# Patient Record
Sex: Female | Born: 1955
Health system: Southern US, Community
[De-identification: ages and names within clinical notes are randomized; demographics above are authoritative.]

## PROBLEM LIST (undated history)

## (undated) DIAGNOSIS — M1990 Unspecified osteoarthritis, unspecified site: Secondary | ICD-10-CM

## (undated) DIAGNOSIS — G629 Polyneuropathy, unspecified: Secondary | ICD-10-CM

## (undated) DIAGNOSIS — M8580 Other specified disorders of bone density and structure, unspecified site: Secondary | ICD-10-CM

## (undated) DIAGNOSIS — K5900 Constipation, unspecified: Secondary | ICD-10-CM

## (undated) DIAGNOSIS — E785 Hyperlipidemia, unspecified: Secondary | ICD-10-CM

## (undated) DIAGNOSIS — J45909 Unspecified asthma, uncomplicated: Secondary | ICD-10-CM

## (undated) DIAGNOSIS — J449 Chronic obstructive pulmonary disease, unspecified: Secondary | ICD-10-CM

## (undated) DIAGNOSIS — F32A Depression, unspecified: Secondary | ICD-10-CM

## (undated) DIAGNOSIS — F329 Major depressive disorder, single episode, unspecified: Secondary | ICD-10-CM

## (undated) DIAGNOSIS — E0590 Thyrotoxicosis, unspecified without thyrotoxic crisis or storm: Secondary | ICD-10-CM

## (undated) DIAGNOSIS — N183 Chronic kidney disease, stage 3 unspecified: Secondary | ICD-10-CM

## (undated) DIAGNOSIS — K589 Irritable bowel syndrome without diarrhea: Secondary | ICD-10-CM

## (undated) DIAGNOSIS — G43909 Migraine, unspecified, not intractable, without status migrainosus: Secondary | ICD-10-CM

## (undated) DIAGNOSIS — F419 Anxiety disorder, unspecified: Secondary | ICD-10-CM

## (undated) DIAGNOSIS — K219 Gastro-esophageal reflux disease without esophagitis: Secondary | ICD-10-CM

## (undated) DIAGNOSIS — M797 Fibromyalgia: Secondary | ICD-10-CM

## (undated) DIAGNOSIS — D649 Anemia, unspecified: Secondary | ICD-10-CM

## (undated) DIAGNOSIS — G4733 Obstructive sleep apnea (adult) (pediatric): Secondary | ICD-10-CM

## (undated) DIAGNOSIS — I10 Essential (primary) hypertension: Secondary | ICD-10-CM

## (undated) HISTORY — DX: Other specified disorders of bone density and structure, unspecified site: M85.80

## (undated) HISTORY — DX: Thyrotoxicosis, unspecified without thyrotoxic crisis or storm: E05.90

## (undated) HISTORY — DX: Chronic kidney disease, stage 3 unspecified: N18.30

## (undated) HISTORY — DX: Migraine, unspecified, not intractable, without status migrainosus: G43.909

## (undated) HISTORY — PX: TRACHEOSTOMY: SUR1362

## (undated) HISTORY — PX: CHOLECYSTECTOMY: SHX55

## (undated) HISTORY — DX: Polyneuropathy, unspecified: G62.9

## (undated) HISTORY — DX: Anemia, unspecified: D64.9

## (undated) HISTORY — PX: TONSILLECTOMY: SUR1361

## (undated) HISTORY — DX: Unspecified osteoarthritis, unspecified site: M19.90

## (undated) HISTORY — DX: Constipation, unspecified: K59.00

## (undated) HISTORY — DX: Obstructive sleep apnea (adult) (pediatric): G47.33

## (undated) HISTORY — PX: EYE SURGERY: SHX253

## (undated) HISTORY — DX: Irritable bowel syndrome, unspecified: K58.9

## (undated) HISTORY — DX: Fibromyalgia: M79.7

## (undated) HISTORY — DX: Hyperlipidemia, unspecified: E78.5

---

## 2000-11-02 ENCOUNTER — Other Ambulatory Visit: Admission: RE | Admit: 2000-11-02 | Discharge: 2000-11-02 | Payer: Self-pay | Admitting: *Deleted

## 2014-07-27 DIAGNOSIS — A419 Sepsis, unspecified organism: Secondary | ICD-10-CM

## 2014-07-27 HISTORY — DX: Sepsis, unspecified organism: A41.9

## 2014-08-01 DIAGNOSIS — M545 Low back pain: Secondary | ICD-10-CM | POA: Diagnosis not present

## 2014-08-01 DIAGNOSIS — M1990 Unspecified osteoarthritis, unspecified site: Secondary | ICD-10-CM | POA: Diagnosis not present

## 2014-08-01 DIAGNOSIS — G894 Chronic pain syndrome: Secondary | ICD-10-CM | POA: Diagnosis not present

## 2014-08-01 DIAGNOSIS — M25552 Pain in left hip: Secondary | ICD-10-CM | POA: Diagnosis not present

## 2014-08-01 DIAGNOSIS — E669 Obesity, unspecified: Secondary | ICD-10-CM | POA: Diagnosis not present

## 2014-08-03 DIAGNOSIS — G894 Chronic pain syndrome: Secondary | ICD-10-CM | POA: Diagnosis not present

## 2014-08-03 DIAGNOSIS — M25561 Pain in right knee: Secondary | ICD-10-CM | POA: Diagnosis not present

## 2014-08-03 DIAGNOSIS — M797 Fibromyalgia: Secondary | ICD-10-CM | POA: Diagnosis not present

## 2014-08-03 DIAGNOSIS — R42 Dizziness and giddiness: Secondary | ICD-10-CM | POA: Diagnosis not present

## 2014-08-22 DIAGNOSIS — M170 Bilateral primary osteoarthritis of knee: Secondary | ICD-10-CM | POA: Diagnosis not present

## 2014-08-22 DIAGNOSIS — M797 Fibromyalgia: Secondary | ICD-10-CM | POA: Diagnosis not present

## 2014-08-28 DIAGNOSIS — M797 Fibromyalgia: Secondary | ICD-10-CM | POA: Diagnosis not present

## 2014-08-28 DIAGNOSIS — R42 Dizziness and giddiness: Secondary | ICD-10-CM | POA: Diagnosis not present

## 2014-08-28 DIAGNOSIS — G894 Chronic pain syndrome: Secondary | ICD-10-CM | POA: Diagnosis not present

## 2014-08-28 DIAGNOSIS — Z9181 History of falling: Secondary | ICD-10-CM | POA: Diagnosis not present

## 2014-08-28 DIAGNOSIS — F329 Major depressive disorder, single episode, unspecified: Secondary | ICD-10-CM | POA: Diagnosis not present

## 2014-08-28 DIAGNOSIS — J449 Chronic obstructive pulmonary disease, unspecified: Secondary | ICD-10-CM | POA: Diagnosis not present

## 2014-08-28 DIAGNOSIS — M25561 Pain in right knee: Secondary | ICD-10-CM | POA: Diagnosis not present

## 2014-08-28 DIAGNOSIS — M1990 Unspecified osteoarthritis, unspecified site: Secondary | ICD-10-CM | POA: Diagnosis not present

## 2014-08-29 DIAGNOSIS — S83241A Other tear of medial meniscus, current injury, right knee, initial encounter: Secondary | ICD-10-CM | POA: Diagnosis not present

## 2014-08-29 DIAGNOSIS — M23303 Other meniscus derangements, unspecified medial meniscus, right knee: Secondary | ICD-10-CM | POA: Diagnosis not present

## 2014-08-30 DIAGNOSIS — J449 Chronic obstructive pulmonary disease, unspecified: Secondary | ICD-10-CM | POA: Diagnosis not present

## 2014-08-30 DIAGNOSIS — M25561 Pain in right knee: Secondary | ICD-10-CM | POA: Diagnosis not present

## 2014-08-30 DIAGNOSIS — Z9181 History of falling: Secondary | ICD-10-CM | POA: Diagnosis not present

## 2014-08-30 DIAGNOSIS — G894 Chronic pain syndrome: Secondary | ICD-10-CM | POA: Diagnosis not present

## 2014-08-30 DIAGNOSIS — F329 Major depressive disorder, single episode, unspecified: Secondary | ICD-10-CM | POA: Diagnosis not present

## 2014-08-30 DIAGNOSIS — M1990 Unspecified osteoarthritis, unspecified site: Secondary | ICD-10-CM | POA: Diagnosis not present

## 2014-08-30 DIAGNOSIS — R42 Dizziness and giddiness: Secondary | ICD-10-CM | POA: Diagnosis not present

## 2014-08-30 DIAGNOSIS — M797 Fibromyalgia: Secondary | ICD-10-CM | POA: Diagnosis not present

## 2014-09-03 DIAGNOSIS — E669 Obesity, unspecified: Secondary | ICD-10-CM | POA: Diagnosis not present

## 2014-09-03 DIAGNOSIS — M25561 Pain in right knee: Secondary | ICD-10-CM | POA: Diagnosis not present

## 2014-09-03 DIAGNOSIS — M1990 Unspecified osteoarthritis, unspecified site: Secondary | ICD-10-CM | POA: Diagnosis not present

## 2014-09-03 DIAGNOSIS — G894 Chronic pain syndrome: Secondary | ICD-10-CM | POA: Diagnosis not present

## 2014-09-03 DIAGNOSIS — R42 Dizziness and giddiness: Secondary | ICD-10-CM | POA: Diagnosis not present

## 2014-09-03 DIAGNOSIS — M797 Fibromyalgia: Secondary | ICD-10-CM | POA: Diagnosis not present

## 2014-09-03 DIAGNOSIS — M545 Low back pain: Secondary | ICD-10-CM | POA: Diagnosis not present

## 2014-09-03 DIAGNOSIS — F329 Major depressive disorder, single episode, unspecified: Secondary | ICD-10-CM | POA: Diagnosis not present

## 2014-09-03 DIAGNOSIS — Z9181 History of falling: Secondary | ICD-10-CM | POA: Diagnosis not present

## 2014-09-03 DIAGNOSIS — M25552 Pain in left hip: Secondary | ICD-10-CM | POA: Diagnosis not present

## 2014-09-03 DIAGNOSIS — J449 Chronic obstructive pulmonary disease, unspecified: Secondary | ICD-10-CM | POA: Diagnosis not present

## 2014-09-04 DIAGNOSIS — M1990 Unspecified osteoarthritis, unspecified site: Secondary | ICD-10-CM | POA: Diagnosis not present

## 2014-09-04 DIAGNOSIS — Z9181 History of falling: Secondary | ICD-10-CM | POA: Diagnosis not present

## 2014-09-04 DIAGNOSIS — R42 Dizziness and giddiness: Secondary | ICD-10-CM | POA: Diagnosis not present

## 2014-09-04 DIAGNOSIS — G894 Chronic pain syndrome: Secondary | ICD-10-CM | POA: Diagnosis not present

## 2014-09-04 DIAGNOSIS — J449 Chronic obstructive pulmonary disease, unspecified: Secondary | ICD-10-CM | POA: Diagnosis not present

## 2014-09-04 DIAGNOSIS — M797 Fibromyalgia: Secondary | ICD-10-CM | POA: Diagnosis not present

## 2014-09-04 DIAGNOSIS — M25561 Pain in right knee: Secondary | ICD-10-CM | POA: Diagnosis not present

## 2014-09-04 DIAGNOSIS — F329 Major depressive disorder, single episode, unspecified: Secondary | ICD-10-CM | POA: Diagnosis not present

## 2014-09-05 DIAGNOSIS — F329 Major depressive disorder, single episode, unspecified: Secondary | ICD-10-CM | POA: Diagnosis not present

## 2014-09-05 DIAGNOSIS — Z9181 History of falling: Secondary | ICD-10-CM | POA: Diagnosis not present

## 2014-09-05 DIAGNOSIS — J449 Chronic obstructive pulmonary disease, unspecified: Secondary | ICD-10-CM | POA: Diagnosis not present

## 2014-09-05 DIAGNOSIS — M1990 Unspecified osteoarthritis, unspecified site: Secondary | ICD-10-CM | POA: Diagnosis not present

## 2014-09-05 DIAGNOSIS — M25561 Pain in right knee: Secondary | ICD-10-CM | POA: Diagnosis not present

## 2014-09-05 DIAGNOSIS — M797 Fibromyalgia: Secondary | ICD-10-CM | POA: Diagnosis not present

## 2014-09-05 DIAGNOSIS — R42 Dizziness and giddiness: Secondary | ICD-10-CM | POA: Diagnosis not present

## 2014-09-05 DIAGNOSIS — G894 Chronic pain syndrome: Secondary | ICD-10-CM | POA: Diagnosis not present

## 2014-09-08 DIAGNOSIS — M1990 Unspecified osteoarthritis, unspecified site: Secondary | ICD-10-CM | POA: Diagnosis not present

## 2014-09-08 DIAGNOSIS — G894 Chronic pain syndrome: Secondary | ICD-10-CM | POA: Diagnosis not present

## 2014-09-08 DIAGNOSIS — M25561 Pain in right knee: Secondary | ICD-10-CM | POA: Diagnosis not present

## 2014-09-08 DIAGNOSIS — Z9181 History of falling: Secondary | ICD-10-CM | POA: Diagnosis not present

## 2014-09-08 DIAGNOSIS — F329 Major depressive disorder, single episode, unspecified: Secondary | ICD-10-CM | POA: Diagnosis not present

## 2014-09-08 DIAGNOSIS — J449 Chronic obstructive pulmonary disease, unspecified: Secondary | ICD-10-CM | POA: Diagnosis not present

## 2014-09-08 DIAGNOSIS — M797 Fibromyalgia: Secondary | ICD-10-CM | POA: Diagnosis not present

## 2014-09-08 DIAGNOSIS — R42 Dizziness and giddiness: Secondary | ICD-10-CM | POA: Diagnosis not present

## 2014-09-16 DIAGNOSIS — R0902 Hypoxemia: Secondary | ICD-10-CM | POA: Diagnosis not present

## 2014-09-16 DIAGNOSIS — J441 Chronic obstructive pulmonary disease with (acute) exacerbation: Secondary | ICD-10-CM | POA: Diagnosis not present

## 2014-09-16 DIAGNOSIS — R069 Unspecified abnormalities of breathing: Secondary | ICD-10-CM | POA: Diagnosis not present

## 2014-09-16 DIAGNOSIS — A419 Sepsis, unspecified organism: Secondary | ICD-10-CM | POA: Diagnosis not present

## 2014-09-16 DIAGNOSIS — R6521 Severe sepsis with septic shock: Secondary | ICD-10-CM | POA: Diagnosis not present

## 2014-09-17 ENCOUNTER — Inpatient Hospital Stay (HOSPITAL_COMMUNITY): Payer: Commercial Managed Care - HMO

## 2014-09-17 ENCOUNTER — Inpatient Hospital Stay (HOSPITAL_COMMUNITY)
Admission: EM | Admit: 2014-09-17 | Discharge: 2014-10-11 | DRG: 004 | Disposition: A | Discharge status: Skilled Nursing Facility | Payer: Commercial Managed Care - HMO | Source: Other Acute Inpatient Hospital | Attending: Emergency Medicine | Admitting: Emergency Medicine

## 2014-09-17 DIAGNOSIS — N390 Urinary tract infection, site not specified: Secondary | ICD-10-CM | POA: Diagnosis not present

## 2014-09-17 DIAGNOSIS — I639 Cerebral infarction, unspecified: Secondary | ICD-10-CM

## 2014-09-17 DIAGNOSIS — Z7951 Long term (current) use of inhaled steroids: Secondary | ICD-10-CM

## 2014-09-17 DIAGNOSIS — A409 Streptococcal sepsis, unspecified: Secondary | ICD-10-CM | POA: Diagnosis present

## 2014-09-17 DIAGNOSIS — E2740 Unspecified adrenocortical insufficiency: Secondary | ICD-10-CM | POA: Diagnosis present

## 2014-09-17 DIAGNOSIS — J9600 Acute respiratory failure, unspecified whether with hypoxia or hypercapnia: Secondary | ICD-10-CM | POA: Diagnosis not present

## 2014-09-17 DIAGNOSIS — E8770 Fluid overload, unspecified: Secondary | ICD-10-CM | POA: Diagnosis not present

## 2014-09-17 DIAGNOSIS — J189 Pneumonia, unspecified organism: Secondary | ICD-10-CM | POA: Diagnosis not present

## 2014-09-17 DIAGNOSIS — F329 Major depressive disorder, single episode, unspecified: Secondary | ICD-10-CM | POA: Diagnosis present

## 2014-09-17 DIAGNOSIS — L03113 Cellulitis of right upper limb: Secondary | ICD-10-CM | POA: Diagnosis not present

## 2014-09-17 DIAGNOSIS — R4020 Unspecified coma: Secondary | ICD-10-CM | POA: Diagnosis not present

## 2014-09-17 DIAGNOSIS — L03115 Cellulitis of right lower limb: Secondary | ICD-10-CM | POA: Diagnosis present

## 2014-09-17 DIAGNOSIS — R0682 Tachypnea, not elsewhere classified: Secondary | ICD-10-CM

## 2014-09-17 DIAGNOSIS — M6282 Rhabdomyolysis: Secondary | ICD-10-CM | POA: Diagnosis not present

## 2014-09-17 DIAGNOSIS — R918 Other nonspecific abnormal finding of lung field: Secondary | ICD-10-CM | POA: Diagnosis not present

## 2014-09-17 DIAGNOSIS — E662 Morbid (severe) obesity with alveolar hypoventilation: Secondary | ICD-10-CM | POA: Diagnosis not present

## 2014-09-17 DIAGNOSIS — L0390 Cellulitis, unspecified: Secondary | ICD-10-CM | POA: Insufficient documentation

## 2014-09-17 DIAGNOSIS — J90 Pleural effusion, not elsewhere classified: Secondary | ICD-10-CM | POA: Diagnosis not present

## 2014-09-17 DIAGNOSIS — I9589 Other hypotension: Secondary | ICD-10-CM | POA: Diagnosis not present

## 2014-09-17 DIAGNOSIS — G8250 Quadriplegia, unspecified: Secondary | ICD-10-CM | POA: Diagnosis not present

## 2014-09-17 DIAGNOSIS — R579 Shock, unspecified: Secondary | ICD-10-CM | POA: Diagnosis present

## 2014-09-17 DIAGNOSIS — R945 Abnormal results of liver function studies: Secondary | ICD-10-CM

## 2014-09-17 DIAGNOSIS — R6521 Severe sepsis with septic shock: Secondary | ICD-10-CM | POA: Diagnosis not present

## 2014-09-17 DIAGNOSIS — R0689 Other abnormalities of breathing: Secondary | ICD-10-CM | POA: Diagnosis not present

## 2014-09-17 DIAGNOSIS — J45909 Unspecified asthma, uncomplicated: Secondary | ICD-10-CM | POA: Diagnosis present

## 2014-09-17 DIAGNOSIS — F419 Anxiety disorder, unspecified: Secondary | ICD-10-CM | POA: Diagnosis present

## 2014-09-17 DIAGNOSIS — E870 Hyperosmolality and hypernatremia: Secondary | ICD-10-CM | POA: Diagnosis present

## 2014-09-17 DIAGNOSIS — J9621 Acute and chronic respiratory failure with hypoxia: Secondary | ICD-10-CM | POA: Diagnosis not present

## 2014-09-17 DIAGNOSIS — N170 Acute kidney failure with tubular necrosis: Secondary | ICD-10-CM | POA: Diagnosis not present

## 2014-09-17 DIAGNOSIS — N179 Acute kidney failure, unspecified: Secondary | ICD-10-CM | POA: Diagnosis present

## 2014-09-17 DIAGNOSIS — F339 Major depressive disorder, recurrent, unspecified: Secondary | ICD-10-CM | POA: Diagnosis not present

## 2014-09-17 DIAGNOSIS — G6281 Critical illness polyneuropathy: Secondary | ICD-10-CM | POA: Diagnosis not present

## 2014-09-17 DIAGNOSIS — Z930 Tracheostomy status: Secondary | ICD-10-CM | POA: Diagnosis not present

## 2014-09-17 DIAGNOSIS — M5032 Other cervical disc degeneration, mid-cervical region: Secondary | ICD-10-CM | POA: Diagnosis not present

## 2014-09-17 DIAGNOSIS — E871 Hypo-osmolality and hyponatremia: Secondary | ICD-10-CM | POA: Diagnosis not present

## 2014-09-17 DIAGNOSIS — R1312 Dysphagia, oropharyngeal phase: Secondary | ICD-10-CM | POA: Diagnosis present

## 2014-09-17 DIAGNOSIS — R4182 Altered mental status, unspecified: Secondary | ICD-10-CM | POA: Insufficient documentation

## 2014-09-17 DIAGNOSIS — J449 Chronic obstructive pulmonary disease, unspecified: Secondary | ICD-10-CM | POA: Diagnosis not present

## 2014-09-17 DIAGNOSIS — R0602 Shortness of breath: Secondary | ICD-10-CM | POA: Diagnosis not present

## 2014-09-17 DIAGNOSIS — K219 Gastro-esophageal reflux disease without esophagitis: Secondary | ICD-10-CM | POA: Diagnosis present

## 2014-09-17 DIAGNOSIS — A419 Sepsis, unspecified organism: Secondary | ICD-10-CM

## 2014-09-17 DIAGNOSIS — G7281 Critical illness myopathy: Secondary | ICD-10-CM | POA: Diagnosis present

## 2014-09-17 DIAGNOSIS — J9690 Respiratory failure, unspecified, unspecified whether with hypoxia or hypercapnia: Secondary | ICD-10-CM

## 2014-09-17 DIAGNOSIS — Z789 Other specified health status: Secondary | ICD-10-CM | POA: Diagnosis not present

## 2014-09-17 DIAGNOSIS — J80 Acute respiratory distress syndrome: Secondary | ICD-10-CM | POA: Insufficient documentation

## 2014-09-17 DIAGNOSIS — R401 Stupor: Secondary | ICD-10-CM | POA: Diagnosis not present

## 2014-09-17 DIAGNOSIS — R1310 Dysphagia, unspecified: Secondary | ICD-10-CM | POA: Diagnosis not present

## 2014-09-17 DIAGNOSIS — G9341 Metabolic encephalopathy: Secondary | ICD-10-CM | POA: Diagnosis present

## 2014-09-17 DIAGNOSIS — Z452 Encounter for adjustment and management of vascular access device: Secondary | ICD-10-CM

## 2014-09-17 DIAGNOSIS — R532 Functional quadriplegia: Secondary | ICD-10-CM | POA: Diagnosis not present

## 2014-09-17 DIAGNOSIS — E43 Unspecified severe protein-calorie malnutrition: Secondary | ICD-10-CM | POA: Diagnosis not present

## 2014-09-17 DIAGNOSIS — Z8249 Family history of ischemic heart disease and other diseases of the circulatory system: Secondary | ICD-10-CM | POA: Diagnosis not present

## 2014-09-17 DIAGNOSIS — I129 Hypertensive chronic kidney disease with stage 1 through stage 4 chronic kidney disease, or unspecified chronic kidney disease: Secondary | ICD-10-CM | POA: Diagnosis present

## 2014-09-17 DIAGNOSIS — J9811 Atelectasis: Secondary | ICD-10-CM | POA: Diagnosis not present

## 2014-09-17 DIAGNOSIS — Z794 Long term (current) use of insulin: Secondary | ICD-10-CM

## 2014-09-17 DIAGNOSIS — E46 Unspecified protein-calorie malnutrition: Secondary | ICD-10-CM | POA: Diagnosis not present

## 2014-09-17 DIAGNOSIS — D259 Leiomyoma of uterus, unspecified: Secondary | ICD-10-CM | POA: Diagnosis not present

## 2014-09-17 DIAGNOSIS — R001 Bradycardia, unspecified: Secondary | ICD-10-CM | POA: Diagnosis not present

## 2014-09-17 DIAGNOSIS — S80811A Abrasion, right lower leg, initial encounter: Secondary | ICD-10-CM | POA: Diagnosis not present

## 2014-09-17 DIAGNOSIS — Z430 Encounter for attention to tracheostomy: Secondary | ICD-10-CM | POA: Diagnosis not present

## 2014-09-17 DIAGNOSIS — M869 Osteomyelitis, unspecified: Secondary | ICD-10-CM | POA: Diagnosis not present

## 2014-09-17 DIAGNOSIS — R7989 Other specified abnormal findings of blood chemistry: Secondary | ICD-10-CM | POA: Diagnosis present

## 2014-09-17 DIAGNOSIS — J441 Chronic obstructive pulmonary disease with (acute) exacerbation: Secondary | ICD-10-CM | POA: Diagnosis not present

## 2014-09-17 DIAGNOSIS — E872 Acidosis: Secondary | ICD-10-CM | POA: Diagnosis present

## 2014-09-17 DIAGNOSIS — N182 Chronic kidney disease, stage 2 (mild): Secondary | ICD-10-CM | POA: Diagnosis present

## 2014-09-17 DIAGNOSIS — J811 Chronic pulmonary edema: Secondary | ICD-10-CM | POA: Diagnosis not present

## 2014-09-17 DIAGNOSIS — J154 Pneumonia due to other streptococci: Secondary | ICD-10-CM | POA: Diagnosis present

## 2014-09-17 DIAGNOSIS — L03119 Cellulitis of unspecified part of limb: Secondary | ICD-10-CM | POA: Diagnosis not present

## 2014-09-17 DIAGNOSIS — Z978 Presence of other specified devices: Secondary | ICD-10-CM

## 2014-09-17 DIAGNOSIS — M6281 Muscle weakness (generalized): Secondary | ICD-10-CM | POA: Diagnosis not present

## 2014-09-17 DIAGNOSIS — R748 Abnormal levels of other serum enzymes: Secondary | ICD-10-CM | POA: Diagnosis not present

## 2014-09-17 DIAGNOSIS — G9340 Encephalopathy, unspecified: Secondary | ICD-10-CM | POA: Diagnosis not present

## 2014-09-17 DIAGNOSIS — Z79899 Other long term (current) drug therapy: Secondary | ICD-10-CM | POA: Diagnosis not present

## 2014-09-17 DIAGNOSIS — I10 Essential (primary) hypertension: Secondary | ICD-10-CM | POA: Diagnosis not present

## 2014-09-17 DIAGNOSIS — R112 Nausea with vomiting, unspecified: Secondary | ICD-10-CM | POA: Diagnosis not present

## 2014-09-17 DIAGNOSIS — I469 Cardiac arrest, cause unspecified: Secondary | ICD-10-CM | POA: Diagnosis not present

## 2014-09-17 DIAGNOSIS — R400 Somnolence: Secondary | ICD-10-CM | POA: Diagnosis not present

## 2014-09-17 DIAGNOSIS — R069 Unspecified abnormalities of breathing: Secondary | ICD-10-CM

## 2014-09-17 DIAGNOSIS — E876 Hypokalemia: Secondary | ICD-10-CM | POA: Diagnosis not present

## 2014-09-17 DIAGNOSIS — J9601 Acute respiratory failure with hypoxia: Secondary | ICD-10-CM | POA: Diagnosis not present

## 2014-09-17 DIAGNOSIS — R0600 Dyspnea, unspecified: Secondary | ICD-10-CM | POA: Diagnosis present

## 2014-09-17 DIAGNOSIS — Z4659 Encounter for fitting and adjustment of other gastrointestinal appliance and device: Secondary | ICD-10-CM

## 2014-09-17 DIAGNOSIS — I517 Cardiomegaly: Secondary | ICD-10-CM | POA: Diagnosis not present

## 2014-09-17 DIAGNOSIS — R0902 Hypoxemia: Secondary | ICD-10-CM | POA: Insufficient documentation

## 2014-09-17 DIAGNOSIS — Z4682 Encounter for fitting and adjustment of non-vascular catheter: Secondary | ICD-10-CM | POA: Diagnosis not present

## 2014-09-17 DIAGNOSIS — M86161 Other acute osteomyelitis, right tibia and fibula: Secondary | ICD-10-CM

## 2014-09-17 DIAGNOSIS — M7989 Other specified soft tissue disorders: Secondary | ICD-10-CM | POA: Diagnosis not present

## 2014-09-17 DIAGNOSIS — J810 Acute pulmonary edema: Secondary | ICD-10-CM | POA: Diagnosis not present

## 2014-09-17 DIAGNOSIS — Z9289 Personal history of other medical treatment: Secondary | ICD-10-CM

## 2014-09-17 DIAGNOSIS — E785 Hyperlipidemia, unspecified: Secondary | ICD-10-CM | POA: Diagnosis not present

## 2014-09-17 HISTORY — DX: Anxiety disorder, unspecified: F41.9

## 2014-09-17 HISTORY — DX: Depression, unspecified: F32.A

## 2014-09-17 HISTORY — DX: Essential (primary) hypertension: I10

## 2014-09-17 HISTORY — DX: Unspecified asthma, uncomplicated: J45.909

## 2014-09-17 HISTORY — DX: Major depressive disorder, single episode, unspecified: F32.9

## 2014-09-17 HISTORY — DX: Chronic obstructive pulmonary disease, unspecified: J44.9

## 2014-09-17 HISTORY — DX: Gastro-esophageal reflux disease without esophagitis: K21.9

## 2014-09-17 LAB — COMPREHENSIVE METABOLIC PANEL
ALBUMIN: 2 g/dL — AB (ref 3.5–5.2)
ALT: 86 U/L — ABNORMAL HIGH (ref 0–35)
AST: 123 U/L — AB (ref 0–37)
Alkaline Phosphatase: 24 U/L — ABNORMAL LOW (ref 39–117)
Anion gap: 12 (ref 5–15)
BUN: 50 mg/dL — AB (ref 6–23)
CALCIUM: 6.8 mg/dL — AB (ref 8.4–10.5)
CHLORIDE: 103 mmol/L (ref 96–112)
CO2: 19 mmol/L (ref 19–32)
Creatinine, Ser: 3.63 mg/dL — ABNORMAL HIGH (ref 0.50–1.10)
GFR calc Af Amer: 15 mL/min — ABNORMAL LOW (ref >90)
GFR calc non Af Amer: 13 mL/min — ABNORMAL LOW (ref >90)
GLUCOSE: 198 mg/dL — AB (ref 70–99)
POTASSIUM: 4.4 mmol/L (ref 3.5–5.1)
SODIUM: 134 mmol/L — AB (ref 135–145)
TOTAL PROTEIN: 4.4 g/dL — AB (ref 6.0–8.3)
Total Bilirubin: 2.5 mg/dL — ABNORMAL HIGH (ref 0.3–1.2)

## 2014-09-17 LAB — BLOOD GAS, ARTERIAL
Acid-base deficit: 8.4 mmol/L — ABNORMAL HIGH (ref 0.0–2.0)
Bicarbonate: 17.6 mEq/L — ABNORMAL LOW (ref 20.0–24.0)
FIO2: 0.4 %
MECHVT: 500 mL
O2 Saturation: 93.5 %
PATIENT TEMPERATURE: 98.6
PEEP: 5 cmH2O
PH ART: 7.243 — AB (ref 7.350–7.450)
RATE: 15 resp/min
TCO2: 18.9 mmol/L (ref 0–100)
pCO2 arterial: 42.2 mmHg (ref 35.0–45.0)
pO2, Arterial: 74.9 mmHg — ABNORMAL LOW (ref 80.0–100.0)

## 2014-09-17 LAB — CBC
HEMATOCRIT: 27.3 % — AB (ref 36.0–46.0)
Hemoglobin: 9.1 g/dL — ABNORMAL LOW (ref 12.0–15.0)
MCH: 26.5 pg (ref 26.0–34.0)
MCHC: 33.3 g/dL (ref 30.0–36.0)
MCV: 79.4 fL (ref 78.0–100.0)
PLATELETS: 109 10*3/uL — AB (ref 150–400)
RBC: 3.44 MIL/uL — ABNORMAL LOW (ref 3.87–5.11)
RDW: 14.7 % (ref 11.5–15.5)
WBC: 5.6 10*3/uL (ref 4.0–10.5)

## 2014-09-17 LAB — PHOSPHORUS: PHOSPHORUS: 4.4 mg/dL (ref 2.3–4.6)

## 2014-09-17 LAB — RAPID URINE DRUG SCREEN, HOSP PERFORMED
Amphetamines: NOT DETECTED
BARBITURATES: NOT DETECTED
BENZODIAZEPINES: NOT DETECTED
COCAINE: NOT DETECTED
OPIATES: POSITIVE — AB
Tetrahydrocannabinol: NOT DETECTED

## 2014-09-17 LAB — PROTIME-INR
INR: 1.63 — ABNORMAL HIGH (ref 0.00–1.49)
PROTHROMBIN TIME: 19.4 s — AB (ref 11.6–15.2)

## 2014-09-17 LAB — MRSA PCR SCREENING: MRSA by PCR: NEGATIVE

## 2014-09-17 LAB — LACTIC ACID, PLASMA: Lactic Acid, Venous: 4.8 mmol/L (ref 0.5–2.0)

## 2014-09-17 LAB — GLUCOSE, CAPILLARY: Glucose-Capillary: 180 mg/dL — ABNORMAL HIGH (ref 70–99)

## 2014-09-17 LAB — MAGNESIUM: Magnesium: 1.9 mg/dL (ref 1.5–2.5)

## 2014-09-17 LAB — PROCALCITONIN: Procalcitonin: 64.94 ng/mL

## 2014-09-17 LAB — CK: CK TOTAL: 1482 U/L — AB (ref 7–177)

## 2014-09-17 MED ORDER — PIPERACILLIN-TAZOBACTAM 3.375 G IVPB
3.3750 g | Freq: Three times a day (TID) | INTRAVENOUS | Status: DC
  Administered 2014-09-18 (×2): 3.375 g via INTRAVENOUS
  Filled 2014-09-17 (×4): qty 50

## 2014-09-17 MED ORDER — FENTANYL CITRATE 0.05 MG/ML IJ SOLN: 50.0000 ug | Freq: Once | INTRAMUSCULAR | Status: DC

## 2014-09-17 MED ORDER — CETYLPYRIDINIUM CHLORIDE 0.05 % MT LIQD
7.0000 mL | Freq: Four times a day (QID) | OROMUCOSAL | Status: DC
  Administered 2014-09-18 – 2014-10-11 (×95): 7 mL via OROMUCOSAL

## 2014-09-17 MED ORDER — SODIUM CHLORIDE 0.9 % IV BOLUS (SEPSIS)
1000.0000 mL | Freq: Once | INTRAVENOUS | Status: AC
  Administered 2014-09-18: 1000 mL via INTRAVENOUS

## 2014-09-17 MED ORDER — FENTANYL BOLUS VIA INFUSION
50.0000 ug | INTRAVENOUS | Status: DC | PRN
  Administered 2014-09-19 – 2014-09-21 (×2): 50 ug via INTRAVENOUS
  Filled 2014-09-17: qty 50

## 2014-09-17 MED ORDER — NOREPINEPHRINE BITARTRATE 1 MG/ML IV SOLN
0.0000 ug/min | INTRAVENOUS | Status: DC
  Administered 2014-09-18: 30 ug/min via INTRAVENOUS
  Filled 2014-09-17: qty 4

## 2014-09-17 MED ORDER — SODIUM CHLORIDE 0.9 % IV SOLN
2000.0000 mg | Freq: Once | INTRAVENOUS | Status: AC
  Administered 2014-09-18: 2000 mg via INTRAVENOUS
  Filled 2014-09-17: qty 2000

## 2014-09-17 MED ORDER — MIDAZOLAM HCL 2 MG/2ML IJ SOLN
2.0000 mg | INTRAMUSCULAR | Status: AC | PRN
  Administered 2014-09-18 (×3): 2 mg via INTRAVENOUS
  Filled 2014-09-17 (×2): qty 2

## 2014-09-17 MED ORDER — SODIUM CHLORIDE 0.9 % IV SOLN
25.0000 ug/h | INTRAVENOUS | Status: DC
  Administered 2014-09-18: 350 ug/h via INTRAVENOUS
  Administered 2014-09-18 (×2): 200 ug/h via INTRAVENOUS
  Administered 2014-09-19 (×3): 400 ug/h via INTRAVENOUS
  Administered 2014-09-20: 250 ug/h via INTRAVENOUS
  Administered 2014-09-20: 350 ug/h via INTRAVENOUS
  Administered 2014-09-20: 400 ug/h via INTRAVENOUS
  Administered 2014-09-21 (×2): 300 ug/h via INTRAVENOUS
  Administered 2014-09-23: 220 ug/h via INTRAVENOUS
  Administered 2014-09-24 (×2): 250 ug/h via INTRAVENOUS
  Administered 2014-09-25: 220 ug/h via INTRAVENOUS
  Administered 2014-09-26: 25 ug/h via INTRAVENOUS
  Administered 2014-09-28: 150 ug/h via INTRAVENOUS
  Filled 2014-09-17 (×20): qty 50

## 2014-09-17 MED ORDER — IPRATROPIUM-ALBUTEROL 0.5-2.5 (3) MG/3ML IN SOLN
3.0000 mL | Freq: Four times a day (QID) | RESPIRATORY_TRACT | Status: DC
  Administered 2014-09-17 – 2014-10-02 (×59): 3 mL via RESPIRATORY_TRACT
  Filled 2014-09-17 (×59): qty 3

## 2014-09-17 MED ORDER — ALBUTEROL SULFATE (2.5 MG/3ML) 0.083% IN NEBU
2.5000 mg | INHALATION_SOLUTION | RESPIRATORY_TRACT | Status: DC | PRN
  Administered 2014-09-19 – 2014-10-03 (×3): 2.5 mg via RESPIRATORY_TRACT
  Filled 2014-09-17 (×3): qty 3

## 2014-09-17 MED ORDER — MIDAZOLAM HCL 2 MG/2ML IJ SOLN
2.0000 mg | INTRAMUSCULAR | Status: DC | PRN
  Filled 2014-09-17 (×3): qty 2

## 2014-09-17 MED ORDER — PANTOPRAZOLE SODIUM 40 MG IV SOLR
40.0000 mg | Freq: Every day | INTRAVENOUS | Status: DC
  Administered 2014-09-18 – 2014-09-21 (×5): 40 mg via INTRAVENOUS
  Filled 2014-09-17 (×6): qty 40

## 2014-09-17 MED ORDER — CHLORHEXIDINE GLUCONATE 0.12 % MT SOLN
15.0000 mL | Freq: Two times a day (BID) | OROMUCOSAL | Status: DC
  Administered 2014-09-18 – 2014-10-11 (×47): 15 mL via OROMUCOSAL
  Filled 2014-09-17 (×46): qty 15

## 2014-09-17 MED ORDER — SODIUM CHLORIDE 0.9 % IV SOLN
250.0000 mL | INTRAVENOUS | Status: DC | PRN
Start: 2014-09-17 — End: 2014-10-11
  Administered 2014-09-21 – 2014-09-26 (×5): 250 mL via INTRAVENOUS

## 2014-09-17 MED ORDER — SODIUM CHLORIDE 0.9 % IV SOLN
INTRAVENOUS | Status: DC
  Administered 2014-09-18 – 2014-09-20 (×3): via INTRAVENOUS

## 2014-09-17 MED ORDER — HEPARIN SODIUM (PORCINE) 5000 UNIT/ML IJ SOLN
5000.0000 [IU] | Freq: Three times a day (TID) | INTRAMUSCULAR | Status: DC
  Administered 2014-09-18 – 2014-09-19 (×5): 5000 [IU] via SUBCUTANEOUS
  Filled 2014-09-17 (×8): qty 1

## 2014-09-17 NOTE — Progress Notes (Signed)
ANTIBIOTIC CONSULT NOTE - INITIAL  Pharmacy Consult for Vancomycin / Zosyn Indication: rule out sepsis  Allergies not on file  Patient Measurements: Height: 5\' 5"  (165.1 cm) Weight: 245 lb 2.4 oz (111.2 kg) IBW/kg (Calculated) : 57   Vital Signs: Temp: 100.3 F (37.9 C) (02/22 2015) Temp Source: Oral (02/22 2015) BP: 100/56 mmHg (02/22 2100) Pulse Rate: 127 (02/22 2100) Intake/Output from previous day:   Intake/Output from this shift:    Labs: No results for input(s): WBC, HGB, PLT, LABCREA, CREATININE in the last 72 hours. CrCl cannot be calculated (Patient has no serum creatinine result on file.). No results for input(s): VANCOTROUGH, VANCOPEAK, VANCORANDOM, GENTTROUGH, GENTPEAK, GENTRANDOM, TOBRATROUGH, TOBRAPEAK, TOBRARND, AMIKACINPEAK, AMIKACINTROU, AMIKACIN in the last 72 hours.   Microbiology: No results found for this or any previous visit (from the past 720 hour(s)).  Medical History: No past medical history on file.  Assessment: 59yof treated for possible UTI at [facility] with Zosyn and Levafloxacin.  Cr 2.8 Crcl approx 5ml/min.  Transferred to [facility]  Broaden ABX to vancomycin and zosyn - will initiate doses tonight and adjust based on lab results in am.  Goal of Therapy:  Vancomycin trough level 15-20 mcg/ml  Plan:  Vancomycin 2gm IV x1 redose 2/23 after lab results back Zosyn 3.375gm IV q8 EI - may need decreased dose based on renal function in AM  [employer].D. CPP, BCPS Clinical Pharmacist [phone] 09/17/2014 9:48 PM

## 2014-09-17 NOTE — H&P (Signed)
PULMONARY / CRITICAL CARE MEDICINE   Name: Jamie Fields MRN: [id number] DOB: 04/08/1956    ADMISSION DATE:  09/17/2014 CONSULTATION DATE:  09/17/2014   REFERRING MD :  [facility]  CHIEF COMPLAINT:  Respiratory Distress  INITIAL PRESENTATION: 59 y.o. F who was taken to [facility] ED early AM hours 2/22 for respiratory distress.  She was admitted and placed on BiPAP.  Was also found to have acute renal failure, AG metabolic acidosis, Overnight, her acidosis and respiratory status worsened to the point she required intubation.  She was also placed on levophed for persistent shock.  Later in day was transferred to [facility] for further management.  STUDIES:  CT Chest 2/22 >>> no acute process CT abd / pelv 2/22 >>> iliac lymph nodes likely reactive.  No acute process  SIGNIFICANT EVENTS: 2/22 - admitted at [place] in early AM hours, transferred to [facility] later that evening   HISTORY OF PRESENT ILLNESS:  Pt is encephelopathic; therefore, this HPI is obtained from chart review. Jamie Fields is a 59 y.o. F with PMH of HTN, Asthma, COPD, GERD, Arthritis, Depression, Anxiety, Pain.  She was in her USOH until early AM hours of 2/22 when she was taken to [facility] ED for N/D, decreased PO intake, SOB, subjective fevers, myalgias. She was admitted for hypoxic respiratory failure and septic shock of unclear etiology. Overnight, her respiratory status and acidosis worsened to the point that she required intubation.  She was later transferred to [facility] for further management.  I spoke with pt's son who stated that pt was in her [place] on 2/22, just 2 days prior to presentation.  Pt's brother then told son that the following day, pt seemed a little weak and SOB.  They did not think anything of it and went about their usual activities.  Later that night, her symptoms worsened and she developed nausea, diarrhea, myalgias.  This is what prompted them to seek medical attention.  PAST MEDICAL HISTORY :  HTN, Asthma,  COPD, GERD, Arthritis, Depression, Anxiety, Pain   has no past medical history on file.  has no past surgical history on file. Prior to Admission medications   Not on File   Allergies not on file  FAMILY HISTORY:  has no family status information on file.  SOCIAL HISTORY:    REVIEW OF SYSTEMS:  Unable to obtain as pt is intubated.  SUBJECTIVE:   VITAL SIGNS: Temp:  [100.3 F (37.9 C)] 100.3 F (37.9 C) (02/22 2015) Pulse Rate:  [118-125] 118 (02/22 2030) Resp:  [22] 22 (02/22 2030) BP: (93-130)/(40-99) 93/40 mmHg (02/22 2030) SpO2:  [94 %-95 %] 94 % (02/22 2030) FiO2 (%):  [40 %] 40 % (02/22 2015) Weight:  [111.2 kg (245 lb 2.4 oz)] 111.2 kg (245 lb 2.4 oz) (02/22 2015) HEMODYNAMICS:   VENTILATOR SETTINGS: Vent Mode:  [-] PRVC FiO2 (%):  [40 %] 40 % Set Rate:  [15 bmp] 15 bmp Vt Set:  [500 mL] 500 mL PEEP:  [5 cmH20] [place] Pressure:  [9 cmH20] 9 cmH20 INTAKE / OUTPUT: No intake or output data in the 24 hours ending 09/17/14 2101  PHYSICAL EXAMINATION: General: Obese female, resting in bed, in NAD. Neuro: Sedated on vent. HEENT: [place]/AT. PERRL, sclerae anicteric. Cardiovascular: RRR, no M/R/G.  Lungs: Respirations shallow and unlabored.  CTA bilaterally, No W/R/R. Abdomen: Obese, BS x 4, soft, NT/ND.  Musculoskeletal: No gross deformities, no edema.  Skin: Intact, warm, no rashes.   LABS:  CBC No results  for input(s): WBC, HGB, HCT, PLT in the last 168 hours. Coag's No results for input(s): APTT, INR in the last 168 hours. BMET No results for input(s): NA, K, CL, CO2, BUN, CREATININE, GLUCOSE in the last 168 hours. Electrolytes No results for input(s): CALCIUM, MG, PHOS in the last 168 hours. Sepsis Markers No results for input(s): LATICACIDVEN, PROCALCITON, O2SATVEN in the last 168 hours. ABG No results for input(s): PHART, PCO2ART, PO2ART in the last 168 hours. Liver Enzymes No results for input(s): AST, ALT, ALKPHOS, BILITOT, ALBUMIN in the  last 168 hours. Cardiac Enzymes No results for input(s): TROPONINI, PROBNP in the last 168 hours. Glucose  Recent Labs Lab 09/17/14 2022  GLUCAP 180*    Imaging No results found.   ASSESSMENT / PLAN:  PULMONARY OETT 2/22 >>> A: Acute hypoxic respiratory failure COPD without evidence of exacerbation Doubt PNA P:   Full vent support. Wean as able depending on improvement in metabolic acidosis Daily SBT if able. DuoNebs / Albuterol. Empiric abx per ID section. ABG and CXR in AM.  CARDIOVASCULAR CVL R fem 2/22 >>> A:  Shock - unclear etiology.  Favor hypovolemic given hx of nausea / diarrhea and decreased PO intake; however, can not confidently r/o septic due to occult infection Hx HTN P:  Continue Levophed PRN. Goal MAP > 65. Check repeat lactate, cortisol. Hold outpatient atorvastatin.  RENAL A:   AG metabolic acidosis - due to lactate + renal failure Acute renal failure Rhabdo - improving with IVF resuscitation Hyponatremia Hypokalemia - repleted at [facility] - corrects to 7.7 P:   Send CMP, CK, lactate now. NS @ 125. BMP in AM.  GASTROINTESTINAL A:  Transaminitis - likely due to shock liver GERD P:   LFT's in AM. Pantoprazole. NPO. Nutrition consult for TF's.  HEMATOLOGIC A:   Anemia VTE prophylaxis P:  Transfuse per usual ICU guidelines. SCD's / Heparin. CBC in AM.  INFECTIOUS A:   Shock - unclear etiology.  Favor hypovolemic given hx of nausea / diarrhea and decreased PO intake; however, can not confidently r/o septic due to occult infection R LE erythema, ? Possible cellulitis vs early necrotizing process P:   BCx2 2/22 >>> UC 2/22 >>> Abx: Vanc, start date 2/22, day 1/x. Abx: Zosyn, start date 2/22, day 1/x. Check PCT.  Abx started empirically given her shock. Would d/c if no infection found  ENDOCRINE A:   No known issues P:   SSI if glucose consistently > 180. Check TSH.  NEUROLOGIC A:   Acute  metabolic encephalopathy Hx Depression, Anxiety, Pain P:   Sedation:  Fentanyl gtt / Versed PRN. RASS goal: 0 to -1. Daily WUA. Send UDS. Hold outpatient gabapentin, meclizine, sumatriptan, amitriptyline, duloxetine, MS Contin, percocet, opana ER, sertraline.  FAMILY  - Updates: Spoke with Ollen Gross (son) over phone night of 2/22.  He will visit in the AM and has requested that we call him if anything changes overnight.   - Inter-disciplinary family meet or Palliative Care meeting due by:  2/28.   Montey Hora, [place] [facility] Pager: [phone]  or [phone] 09/17/2014, 10:03 PM  Attending Note:  I have examined patient, reviewed labs, studies and notes. I have discussed the case with Jun[place], and I agree with the data and plans as amended above. Ms Lanese is evaluated in transfer from [facility] 2/22 pm. She presented with renal failure and metabolic acidosis following an acute diarrheal  illness x 3 days. She ultimately required intubation / MV and pressors to achieve stability. ED eval showed a lactic / uremic acidosis, elevated CPK, but no clear evidence for infection. Screening CT CAP was normal.  Unclear whether she was screened for toxic substances. On arrival she is tachycardic to 110's, has clear lung exam, has generalized tenderness to deep abd palpation. There is a 3-4 cm irregular non-raised area of erythema on her R shin but no warmth or apparent skin gas or breakdown. I suspect that her decompensation was due to hypovolemia, resultant renal failure + rhabdomyolysis and then inability to compensate for metabolic acidosis. Certainly must be concerned about and vigilant for occult infection. We will treat her empirically w abx until confident that there is no infection. If her RLE erythema grows then we will image her RLE. UDS and metabolic labs sent to insure clearance. My independent critical care time is 50 minutes.   Baltazar Apo, MD,  PhD 09/18/2014, 8:29 AM [place] [facility] [phone] or if no answer [phone]

## 2014-09-18 ENCOUNTER — Inpatient Hospital Stay (HOSPITAL_COMMUNITY): Payer: Commercial Managed Care - HMO

## 2014-09-18 DIAGNOSIS — R7989 Other specified abnormal findings of blood chemistry: Secondary | ICD-10-CM

## 2014-09-18 DIAGNOSIS — R945 Abnormal results of liver function studies: Secondary | ICD-10-CM

## 2014-09-18 DIAGNOSIS — I9589 Other hypotension: Secondary | ICD-10-CM

## 2014-09-18 DIAGNOSIS — J9601 Acute respiratory failure with hypoxia: Secondary | ICD-10-CM | POA: Diagnosis present

## 2014-09-18 DIAGNOSIS — Z452 Encounter for adjustment and management of vascular access device: Secondary | ICD-10-CM | POA: Insufficient documentation

## 2014-09-18 DIAGNOSIS — E662 Morbid (severe) obesity with alveolar hypoventilation: Secondary | ICD-10-CM | POA: Diagnosis present

## 2014-09-18 DIAGNOSIS — N179 Acute kidney failure, unspecified: Secondary | ICD-10-CM | POA: Diagnosis present

## 2014-09-18 LAB — BASIC METABOLIC PANEL
ANION GAP: 11 (ref 5–15)
Anion gap: 13 (ref 5–15)
Anion gap: 16 — ABNORMAL HIGH (ref 5–15)
BUN: 54 mg/dL — AB (ref 6–23)
BUN: 62 mg/dL — AB (ref 6–23)
BUN: 63 mg/dL — AB (ref 6–23)
CALCIUM: 6.8 mg/dL — AB (ref 8.4–10.5)
CO2: 19 mmol/L (ref 19–32)
CO2: 16 mmol/L — ABNORMAL LOW (ref 19–32)
CO2: 18 mmol/L — ABNORMAL LOW (ref 19–32)
CREATININE: 3.76 mg/dL — AB (ref 0.50–1.10)
CREATININE: 3.63 mg/dL — AB (ref 0.50–1.10)
CREATININE: 3.64 mg/dL — AB (ref 0.50–1.10)
Calcium: 6.6 mg/dL — ABNORMAL LOW (ref 8.4–10.5)
Calcium: 6.8 mg/dL — ABNORMAL LOW (ref 8.4–10.5)
Chloride: 103 mmol/L (ref 96–112)
Chloride: 103 mmol/L (ref 96–112)
Chloride: 106 mmol/L (ref 96–112)
GFR calc Af Amer: 14 mL/min — ABNORMAL LOW (ref >90)
GFR calc Af Amer: 15 mL/min — ABNORMAL LOW (ref >90)
GFR calc non Af Amer: 13 mL/min — ABNORMAL LOW (ref >90)
GFR calc non Af Amer: 13 mL/min — ABNORMAL LOW (ref >90)
GFR, EST AFRICAN AMERICAN: 15 mL/min — AB (ref >90)
GFR, EST NON AFRICAN AMERICAN: 12 mL/min — AB (ref >90)
GLUCOSE: 184 mg/dL — AB (ref 70–99)
Glucose, Bld: 165 mg/dL — ABNORMAL HIGH (ref 70–99)
Glucose, Bld: 151 mg/dL — ABNORMAL HIGH (ref 70–99)
POTASSIUM: 4.2 mmol/L (ref 3.5–5.1)
Potassium: 4 mmol/L (ref 3.5–5.1)
Potassium: 3.9 mmol/L (ref 3.5–5.1)
Sodium: 135 mmol/L (ref 135–145)
Sodium: 135 mmol/L (ref 135–145)
Sodium: 135 mmol/L (ref 135–145)

## 2014-09-18 LAB — BLOOD GAS, ARTERIAL
ACID-BASE DEFICIT: 10.2 mmol/L — AB (ref 0.0–2.0)
ACID-BASE DEFICIT: 8 mmol/L — AB (ref 0.0–2.0)
Acid-base deficit: 6.8 mmol/L — ABNORMAL HIGH (ref 0.0–2.0)
BICARBONATE: 16.2 meq/L — AB (ref 20.0–24.0)
Bicarbonate: 17 mEq/L — ABNORMAL LOW (ref 20.0–24.0)
Bicarbonate: 18.6 mEq/L — ABNORMAL LOW (ref 20.0–24.0)
Drawn by: 31101
Drawn by: 31101
Drawn by: 31101
FIO2: 0.4 %
FIO2: 0.4 %
FIO2: 0.4 %
LHR: 15 {breaths}/min
LHR: 30 {breaths}/min
MECHVT: 500 mL
MECHVT: 500 mL
MECHVT: 430 mL
O2 Saturation: 94.7 %
O2 Saturation: 92.7 %
O2 Saturation: 88.3 %
PATIENT TEMPERATURE: 98.6
PATIENT TEMPERATURE: 98.6
PCO2 ART: 42.6 mmHg (ref 35.0–45.0)
PCO2 ART: 34.7 mmHg — AB (ref 35.0–45.0)
PEEP/CPAP: 5 cmH2O
PEEP: 5 cmH2O
PEEP: 5 cmH2O
PH ART: 7.287 — AB (ref 7.350–7.450)
PO2 ART: 62.4 mmHg — AB (ref 80.0–100.0)
Patient temperature: 99.2
RATE: 30 resp/min
TCO2: 17.5 mmol/L (ref 0–100)
TCO2: 18.1 mmol/L (ref 0–100)
TCO2: 19.9 mmol/L (ref 0–100)
pCO2 arterial: 40.3 mmHg (ref 35.0–45.0)
pH, Arterial: 7.208 — ABNORMAL LOW (ref 7.350–7.450)
pH, Arterial: 7.312 — ABNORMAL LOW (ref 7.350–7.450)
pO2, Arterial: 86.2 mmHg (ref 80.0–100.0)
pO2, Arterial: 68.2 mmHg — ABNORMAL LOW (ref 80.0–100.0)

## 2014-09-18 LAB — SODIUM, URINE, RANDOM: SODIUM UR: 13 mmol/L

## 2014-09-18 LAB — POCT I-STAT 3, ART BLOOD GAS (G3+)
ACID-BASE DEFICIT: 9 mmol/L — AB (ref 0.0–2.0)
BICARBONATE: 17.1 meq/L — AB (ref 20.0–24.0)
O2 Saturation: 89 %
PO2 ART: 65 mmHg — AB (ref 80.0–100.0)
Patient temperature: 98.9
TCO2: 18 mmol/L (ref 0–100)
pCO2 arterial: 38 mmHg (ref 35.0–45.0)
pH, Arterial: 7.263 — ABNORMAL LOW (ref 7.350–7.450)

## 2014-09-18 LAB — CREATININE, URINE, RANDOM: Creatinine, Urine: 98.62 mg/dL

## 2014-09-18 LAB — GLUCOSE, CAPILLARY: Glucose-Capillary: 159 mg/dL — ABNORMAL HIGH (ref 70–99)

## 2014-09-18 LAB — URINE MICROSCOPIC-ADD ON

## 2014-09-18 LAB — CBC
HCT: 26.9 % — ABNORMAL LOW (ref 36.0–46.0)
HEMOGLOBIN: 9 g/dL — AB (ref 12.0–15.0)
MCH: 26.3 pg (ref 26.0–34.0)
MCHC: 33.5 g/dL (ref 30.0–36.0)
MCV: 78.7 fL (ref 78.0–100.0)
Platelets: 100 10*3/uL — ABNORMAL LOW (ref 150–400)
RBC: 3.42 MIL/uL — ABNORMAL LOW (ref 3.87–5.11)
RDW: 14.9 % (ref 11.5–15.5)
WBC: 7 10*3/uL (ref 4.0–10.5)

## 2014-09-18 LAB — URINALYSIS, ROUTINE W REFLEX MICROSCOPIC
Glucose, UA: NEGATIVE mg/dL
Ketones, ur: 15 mg/dL — AB
Nitrite: NEGATIVE
Protein, ur: 100 mg/dL — AB
Specific Gravity, Urine: 1.02 (ref 1.005–1.030)
UROBILINOGEN UA: 1 mg/dL (ref 0.0–1.0)
pH: 5.5 (ref 5.0–8.0)

## 2014-09-18 LAB — LACTIC ACID, PLASMA
LACTIC ACID, VENOUS: 3.8 mmol/L — AB (ref 0.5–2.0)
Lactic Acid, Venous: 5.4 mmol/L (ref 0.5–2.0)
Lactic Acid, Venous: 4.4 mmol/L (ref 0.5–2.0)

## 2014-09-18 LAB — TSH: TSH: 0.258 u[IU]/mL — ABNORMAL LOW (ref 0.350–4.500)

## 2014-09-18 LAB — HEPATIC FUNCTION PANEL
ALT: 83 U/L — AB (ref 0–35)
AST: 113 U/L — AB (ref 0–37)
Albumin: 2.1 g/dL — ABNORMAL LOW (ref 3.5–5.2)
Alkaline Phosphatase: 29 U/L — ABNORMAL LOW (ref 39–117)
Bilirubin, Direct: 1.5 mg/dL — ABNORMAL HIGH (ref 0.0–0.5)
Indirect Bilirubin: 0.9 mg/dL (ref 0.3–0.9)
TOTAL PROTEIN: 4.2 g/dL — AB (ref 6.0–8.3)
Total Bilirubin: 2.4 mg/dL — ABNORMAL HIGH (ref 0.3–1.2)

## 2014-09-18 LAB — TROPONIN I: Troponin I: 0.1 ng/mL — ABNORMAL HIGH (ref <0.031)

## 2014-09-18 LAB — URIC ACID: Uric Acid, Serum: 5.7 mg/dL (ref 2.4–7.0)

## 2014-09-18 LAB — CORTISOL: Cortisol, Plasma: 58.3 ug/dL

## 2014-09-18 LAB — LIPASE, BLOOD: LIPASE: 13 U/L (ref 11–59)

## 2014-09-18 LAB — LACTATE DEHYDROGENASE: LDH: 337 U/L — ABNORMAL HIGH (ref 94–250)

## 2014-09-18 MED ORDER — PIPERACILLIN-TAZOBACTAM IN DEX 2-0.25 GM/50ML IV SOLN
2.2500 g | Freq: Four times a day (QID) | INTRAVENOUS | Status: DC
  Administered 2014-09-18 – 2014-09-20 (×8): 2.25 g via INTRAVENOUS
  Filled 2014-09-18 (×9): qty 50

## 2014-09-18 MED ORDER — SODIUM CHLORIDE 0.9 % IV BOLUS (SEPSIS)
500.0000 mL | Freq: Once | INTRAVENOUS | Status: AC
  Administered 2014-09-18: 500 mL via INTRAVENOUS

## 2014-09-18 MED ORDER — ACETAMINOPHEN 160 MG/5ML PO SOLN
650.0000 mg | Freq: Four times a day (QID) | ORAL | Status: DC | PRN
  Administered 2014-09-18 – 2014-10-10 (×9): 650 mg via ORAL
  Filled 2014-09-18 (×10): qty 20.3

## 2014-09-18 MED ORDER — SODIUM BICARBONATE 8.4 % IV SOLN
100.0000 meq | Freq: Once | INTRAVENOUS | Status: AC
  Administered 2014-09-18: 100 meq via INTRAVENOUS

## 2014-09-18 MED ORDER — OSELTAMIVIR PHOSPHATE 30 MG PO CAPS: 30.0000 mg | ORAL_CAPSULE | Freq: Every day | ORAL | Status: DC

## 2014-09-18 MED ORDER — SODIUM BICARBONATE 8.4 % IV SOLN
INTRAVENOUS | Status: AC
Start: 2014-09-18 — End: 2014-09-18
  Filled 2014-09-18: qty 100

## 2014-09-18 MED ORDER — MIDAZOLAM HCL 5 MG/ML IJ SOLN
0.0000 mg/h | INTRAMUSCULAR | Status: DC
  Administered 2014-09-18: 10 mg/h via INTRAVENOUS
  Administered 2014-09-18: 2 mg/h via INTRAVENOUS
  Administered 2014-09-19 – 2014-09-20 (×5): 10 mg/h via INTRAVENOUS
  Administered 2014-09-20: 6 mg/h via INTRAVENOUS
  Administered 2014-09-20: 4 mg/h via INTRAVENOUS
  Administered 2014-09-21: 6 mg/h via INTRAVENOUS
  Administered 2014-09-21: 3 mg/h via INTRAVENOUS
  Administered 2014-09-22: 4 mg/h via INTRAVENOUS
  Administered 2014-09-23: 6 mg/h via INTRAVENOUS
  Administered 2014-09-23 – 2014-09-24 (×4): 8 mg/h via INTRAVENOUS
  Filled 2014-09-18 (×19): qty 10

## 2014-09-18 MED ORDER — VASOPRESSIN 20 UNIT/ML IV SOLN
0.0300 [IU]/min | INTRAVENOUS | Status: DC
  Administered 2014-09-18 – 2014-09-21 (×4): 0.03 [IU]/min via INTRAVENOUS
  Filled 2014-09-18 (×5): qty 2

## 2014-09-18 MED ORDER — HYDROCORTISONE NA SUCCINATE PF 100 MG IJ SOLR
50.0000 mg | Freq: Four times a day (QID) | INTRAMUSCULAR | Status: DC
  Administered 2014-09-18 – 2014-09-24 (×24): 50 mg via INTRAVENOUS
  Filled 2014-09-18 (×6): qty 1
  Filled 2014-09-18: qty 2
  Filled 2014-09-18 (×7): qty 1
  Filled 2014-09-18 (×2): qty 2
  Filled 2014-09-18 (×2): qty 1
  Filled 2014-09-18 (×2): qty 2
  Filled 2014-09-18 (×9): qty 1

## 2014-09-18 MED ORDER — OSELTAMIVIR PHOSPHATE 6 MG/ML PO SUSR
30.0000 mg | Freq: Every day | ORAL | Status: DC
  Administered 2014-09-18 – 2014-09-19 (×2): 30 mg via ORAL
  Filled 2014-09-18 (×2): qty 5

## 2014-09-18 MED ORDER — DEXTROSE 5 % IV SOLN
0.0000 ug/min | INTRAVENOUS | Status: DC
  Administered 2014-09-18: 35 ug/min via INTRAVENOUS
  Administered 2014-09-18: 20 ug/min via INTRAVENOUS
  Administered 2014-09-18: 30 ug/min via INTRAVENOUS
  Administered 2014-09-19: 14 ug/min via INTRAVENOUS
  Filled 2014-09-18 (×4): qty 16

## 2014-09-18 MED ORDER — LEVOFLOXACIN IN D5W 750 MG/150ML IV SOLN
750.0000 mg | INTRAVENOUS | Status: DC
  Administered 2014-09-19: 750 mg via INTRAVENOUS
  Filled 2014-09-18 (×2): qty 150

## 2014-09-18 NOTE — Progress Notes (Signed)
  Echocardiogram 2D Echocardiogram has been performed.  Jamie Fields 09/18/2014, 4:00 PM

## 2014-09-18 NOTE — Procedures (Signed)
Central Venous Catheter Insertion Procedure Note MADALINE LEFEBER [id number] [birth year]  Procedure: Insertion of Central Venous Catheter Indications: Drug and/or fluid administration  Procedure Details Consent: Risks of procedure as well as the alternatives and risks of each were explained to the (patient/caregiver).  Consent for procedure obtained. Time Out: Verified patient identification, verified procedure, site/side was marked, verified correct patient position, special equipment/implants available, medications/allergies/relevent history reviewed, required imaging and test results available.  Performed  Maximum sterile technique was used including antiseptics, cap, gloves, gown, hand hygiene, mask and sheet. Skin prep: Chlorhexidine; local anesthetic administered A antimicrobial bonded/coated triple lumen catheter was placed in the left internal jugular vein using the Seldinger technique.  Evaluation Blood flow good Complications: No apparent complications Patient did tolerate procedure well. Chest X-ray ordered to verify placement.  CXR: pending.  Phill Myron 09/18/2014, 11:09 AM   [place] Consented  [place] Titus Mould, MD, [place] Pgr: [employer] [facility]

## 2014-09-18 NOTE — Progress Notes (Addendum)
PULMONARY / CRITICAL CARE MEDICINE   Name: Jamie Fields MRN: [id number] DOB: Dec 06, 1955    ADMISSION DATE:  09/17/2014 CONSULTATION DATE:  09/17/2014   REFERRING MD :  [facility]  CHIEF COMPLAINT:  Respiratory Distress  INITIAL PRESENTATION: 59 y.o. F who was taken to [facility] ED early AM hours 2/22 for respiratory distress.  She was admitted and placed on BiPAP.  Was also found to have acute renal failure, AG metabolic acidosis, Overnight, her acidosis and respiratory status worsened to the point she required intubation.  She was also placed on levophed for persistent shock.  Later in day was transferred to [facility] for further management.  STUDIES:  CT Chest 2/22 >>> no acute process CT abd / pelv 2/22 >>> iliac lymph nodes likely reactive.  No acute process CXR 2/22 >> Cardiac enlargement with pulmonary vascular congestion and bilateral pleural effusions. Slight improvement EKG >> ordered Echo 2/23>>>  SIGNIFICANT EVENTS: 2/22 - admitted at [place]; respiratory failure-intubated; shock; transferred to [facility] 2/23 - No Levo & Vaso; intubated  SUBJECTIVE: Intubated and sedated. Does open eyes or follow commands.   VITAL SIGNS: Temp:  [99.2 F (37.3 C)-100.4 F (38 C)] 100.4 F (38 C) (02/23 0438) Pulse Rate:  [118-137] 135 (02/23 0645) Resp:  [13-30] 27 (02/23 0645) BP: (59-159)/(37-107) 112/77 mmHg (02/23 0645) SpO2:  [92 %-99 %] 93 % (02/23 0645) FiO2 (%):  [40 %] 40 % (02/23 0600) Weight:  [245 lb 2.4 oz (111.2 kg)-256 lb 2.8 oz (116.2 kg)] 256 lb 2.8 oz (116.2 kg) (02/23 0500) HEMODYNAMICS:   VENTILATOR SETTINGS: Vent Mode:  [-] PRVC FiO2 (%):  [40 %] 40 % Set Rate:  [15 bmp-30 bmp] 30 bmp Vt Set:  [500 mL] 500 mL PEEP:  [5 cmH20] 5 cmH20 Plateau Pressure:  [9 [id number] cmH20] 28 cmH20 INTAKE / OUTPUT:  Intake/Output Summary (Last 24 hours) at 09/18/14 0656 Last data filed at 09/18/14 6962  Gross per 24 hour  Intake 3060.42 ml  Output    160 ml  Net 2900.42  ml    PHYSICAL EXAMINATION: General: Obese female, restless Neuro: Sedated on vent. HEENT: [place]/AT. PERRL, sclerae anicteric. Cardiovascular: RRR, no M/R/G.  Lungs: coarse distant Abdomen: Obese, BS x 4, soft, NT/ND.  Musculoskeletal: No gross deformities, no edema.  Skin: Intact, warm, no rashes.  LABS:  CBC  Recent Labs Lab 09/17/14 2150 09/18/14 0500  WBC 5.6 7.0  HGB 9.1* 9.0*  HCT 27.3* 26.9*  PLT 109* 100*   Coag's  Recent Labs Lab 09/17/14 2150  INR 1.63*   BMET  Recent Labs Lab 09/17/14 2150  NA 134*  K 4.4  CL 103  CO2 19  BUN 50*  CREATININE 3.63*  GLUCOSE 198*   Electrolytes  Recent Labs Lab 09/17/14 2150  CALCIUM 6.8*  MG 1.9  PHOS 4.4   Sepsis Markers  Recent Labs Lab 09/17/14 2200  LATICACIDVEN 4.8*  PROCALCITON 64.94   ABG  Recent Labs Lab 09/17/14 2132 09/18/14 0239 09/18/14 0508  PHART 7.243* 7.208* 7.312*  PCO2ART 42.2 42.6 34.7*  PO2ART 74.9* 86.2 68.2*   Liver Enzymes  Recent Labs Lab 09/17/14 2150  AST 123*  ALT 86*  ALKPHOS 24*  BILITOT 2.5*  ALBUMIN 2.0*   Cardiac Enzymes No results for input(s): TROPONINI, PROBNP in the last 168 hours. Glucose  Recent Labs Lab 09/17/14 2022  GLUCAP 180*    Imaging Dg Chest Port 1 View  09/17/2014   CLINICAL DATA:  Follow-up respiratory failure  EXAM: PORTABLE CHEST - 1 VIEW  COMPARISON:  09/17/2014  FINDINGS: Endotracheal tube tip measures 3.3 cm above the carinal. Enteric tube tip is off the field of view but below the left hemidiaphragm. Shallow inspiration. Mild cardiac enlargement with probable pulmonary vascular congestion. Perihilar infiltration suggesting edema or pneumonia. Small bilateral pleural effusions appear slightly improved since prior study. No pneumothorax. Degenerative changes in the spine.  IMPRESSION: Cardiac enlargement with pulmonary vascular congestion and bilateral pleural effusions. Slight improvement since previous study.   Electronically  Signed   By: Lucienne Capers M.D.   On: 09/17/2014 21:25     ASSESSMENT / PLAN:  PULMONARY OETT 2/22 >>> A: Acute hypoxic respiratory failure COPD without evidence of exacerbation Blossoming infiltrates, vs edema Concern ARDS (aspiration) P:   Full vent support. Need to assess cvp ABG reviewed, increase MV as max Keep plat less 30, start 7 cc/kg DuoNebs / Albuterol Empiric abx per ID section. Pos balance goals until we assess cvp abg in 1 hr  CARDIOVASCULAR CVL R fem 2/22 >>> A:  Shock - unclear etiology.  ? Hypovolemic Likely this is Septic shock Hx HTN R/o cardiomyopathy, assess for pericardial effusion R/o rel AI Sinus tachy P:  Continue Levophed PRN. Goal MAP > 65. Lactate = 4.8 (2/22); cortisol pending Repeat lactic acid Ensure 30 cc/kg bolus was given Continue dvaso Start empiric steroids Hold outpatient atorvastatin. - check trop; EKG - Consider ECHO Place line neck, cvp -place aline -ensure tsh -treat feevrs  RENAL A:   Mixed metabolic acidosis - due to lactate + renal failure Acute renal failure Rhabdo - improving with IVF resuscitation Hyponatremia Hypokalemia - repleted at [facility] - corrects to 7.7 P:   CK: 1482 (2/22) NS @ 125 BMP in AM No hydro on CT Obtain cvp Obtain UA, urine na, osm, serum uric acid bmet q8h  GASTROINTESTINAL A:  Transaminitis - likely due to shock liver GERD Gastroenteritis? At risk ischemia nontransmural ( CT neg) P:   LFT's repeat in am  Pantoprazole. NPO. Send cdiff Nutrition consult for TF's. Send stool enteric pathogen, leukocytes  See ID  HEMATOLOGIC A:   Anemia VTE prophylaxis Mild thrombocytopenia P:  Transfuse per usual ICU guidelines. SCD's / Heparin. CBC in AM.  INFECTIOUS A:   Septic Shock Source: r/o aspiration PNA, GI source gastroenteritis, r/o CAP R/o influenza P:   BCx2 2/22 >>> UC 2/22 >>> Abx: Vanc, start date 2/22>>> Abx: Zosyn, start date  2/22>>> Levofloxacin 2/23>>> PCT = 64.9 Repeat pct, la  Add Levofloxacin  Sending Gi work up  Re evaluate leg area, may ned CT  ENDOCRINE A:   R/o rel AI P:   SSI if glucose consistently > 180. Check TSH Cortisol pending, may be inaccurate as got solumedral  NEUROLOGIC A:   Acute metabolic encephalopathy Hx Depression, Anxiety, Pain P:   Sedation:  Fentanyl gtt / Versed PRN. RASS goal: 0 to -1. Daily WUA, may  Be unable with ards settings Send UDS. Hold outpatient gabapentin, meclizine, sumatriptan, amitriptyline, duloxetine, MS Contin, percocet, opana ER, sertraline. If able dc versed to prn  FAMILY  - Updates: son updated  - Inter-disciplinary family meet or Palliative Care meeting due by:  2/28.  Olam Idler, MD 09/18/2014, 7:22 AM PGY-2, [place] [facility]  STAFF NOTE: I, Merrie Roof, MD FACP have personally reviewed patient's available data, including medical history, events of note, physical examination and test results as part of my evaluation. I have discussed with resident/NP  and other care providers such as pharmacist, RN and RRT. In addition, I personally evaluated patient and elicited key findings of: pcxr worsening, GI pathogen, asp, cap concerning, rapid changes of pcxr, place cvp, aline, add levofloxacin, dc fem line when able, increase MV vent, ARDS settings, kee plat less 30, family updated by me in room, send cdiff, bmet q8h needed, attempt to wean versed, may need toct leg, rhabdo bad, add tamiflu, send flu The patient is critically ill with multiple organ systems failure and requires high complexity decision making for assessment and support, frequent evaluation and titration of therapies, application of advanced monitoring technologies and extensive interpretation of multiple databases.   Critical Care Time devoted to patient care services described in this note is60 Minutes. This time reflects time of care of this signee: Merrie Roof, MD FACP. This critical care time does not reflect procedure time, or teaching time or supervisory time of PA/NP/Med student/Med Resident etc but could involve care discussion time. Rest per NP/medical resident whose note is outlined above and that I agree with   [name]. Titus Mould, MD, [place] Pgr: [place] [facility] 09/18/2014 8:31 AM

## 2014-09-18 NOTE — Procedures (Signed)
Arterial Catheter Insertion Procedure Note Jamie Fields [id number] 03-Apr-1956  Procedure: Insertion of Arterial Catheter  Indications: Blood pressure monitoring  Procedure Details Consent: Risks of procedure as well as the alternatives and risks of each were explained to the (patient/caregiver).  Consent for procedure obtained. Time Out: Verified patient identification, verified procedure, site/side was marked, verified correct patient position, special equipment/implants available, medications/allergies/relevent history reviewed, required imaging and test results available.  Performed  Maximum sterile technique was used including antiseptics, cap, gloves, gown, hand hygiene, mask and sheet. Skin prep: Chlorhexidine; local anesthetic administered 20 gauge catheter was inserted into left radial artery using the Seldinger technique.  Evaluation Blood flow good; BP tracing good. Complications: No apparent complications.   Jamie Fields 09/18/2014  [place] conseted Jamie Fields. Jamie Mould, MD, [place] Pgr: [place] [facility]

## 2014-09-18 NOTE — Progress Notes (Signed)
PULMONARY / CRITICAL CARE MEDICINE   Name: Jamie Fields MRN: [id number] DOB: Feb 15, 1956    ADMISSION DATE:  09/17/2014 CONSULTATION DATE:  09/17/2014   REFERRING MD :  [facility]  CHIEF COMPLAINT:  Respiratory Distress  INITIAL PRESENTATION: 59 y.o. F who was taken to [facility] ED early AM hours 2/22 for respiratory distress.  She was admitted and placed on BiPAP.  Was also found to have acute renal failure, AG metabolic acidosis, Overnight, her acidosis and respiratory status worsened to the point she required intubation.  She was also placed on levophed for persistent shock.  Later in day was transferred to [facility] for further management.  STUDIES:  CT Chest 2/22 >>> no acute process CT abd / pelv 2/22 >>> Iliac lymph nodes likely reactive.  No acute process CXR 2/22 >> Cardiac enlargement with pulmonary vascular congestion and bilateral pleural effusions. Slight improvement EKG >> ordered 2/23 DG R tib/fib > mild soft tissue swelling without body abnormality. Echo 2/23> LVEF 60-65%, Mild LA dilation, moderate RA dilation  SIGNIFICANT EVENTS: 2/22 - admitted at [place]; respiratory failure-intubated; shock; transferred to [facility] 2/23 - Now Levo & Vaso; intubated  SUBJECTIVE/INTERVAL: Intubated and sedated. Does not open eyes or follow commands. Requiring 20 mcg levophed and shock dose vaso. Lactic somewhat clearing. Renal function poor, but stable. UDS positive for only optaites. Echo as above. CVPs 15-20.  VITAL SIGNS: Temp:  [98.9 F (37.2 C)-101.8 F (38.8 C)] 98.9 F (37.2 C) (02/23 1608) Pulse Rate:  [110-137] 125 (02/23 1745) Resp:  [13-32] 27 (02/23 1745) BP: (59-159)/(37-107) 137/98 mmHg (02/23 1700) SpO2:  [87 %-99 %] 94 % (02/23 1745) Arterial Line BP: (94-142)/(53-69) 100/53 mmHg (02/23 1745) FiO2 (%):  [40 %] 40 % (02/23 1508) Weight:  [111.2 kg (245 lb 2.4 oz)-116.2 kg (256 lb 2.8 oz)] 116.2 kg (256 lb 2.8 oz) (02/23 0500) HEMODYNAMICS: CVP:  [11 mmHg-28 mmHg]  15 mmHg VENTILATOR SETTINGS: Vent Mode:  [-] PRVC FiO2 (%):  [40 %] 40 % Set Rate:  [15 bmp-30 bmp] 30 bmp Vt Set:  [430 mL-500 mL] 430 mL PEEP:  [5 cmH20] 5 cmH20 Plateau Pressure:  [9 cmH20-31 cmH20] 31 cmH20 INTAKE / OUTPUT:  Intake/Output Summary (Last 24 hours) at 09/18/14 1759 Last data filed at 09/18/14 1500  Gross per 24 hour  Intake 4801.42 ml  Output    260 ml  Net 4541.42 ml    PHYSICAL EXAMINATION: General: Obese female, restless Neuro: Sedated on vent. HEENT: [place]/AT. PERRL, sclerae anicteric. Cardiovascular: RRR, no M/R/G.  Lungs: coarse throughout Abdomen: Obese, BS x 4, soft, NT/ND.  Musculoskeletal: No gross deformities, no edema.  Skin: Intact, warm, no rashes.  LABS:  CBC  Recent Labs Lab 09/17/14 2150 09/18/14 0500  WBC 5.6 7.0  HGB 9.1* 9.0*  HCT 27.3* 26.9*  PLT 109* 100*   Coag's  Recent Labs Lab 09/17/14 2150  INR 1.63*   BMET  Recent Labs Lab 09/18/14 0500 09/18/14 1200 09/18/14 1700  NA 135 135 135  K 4.2 4.0 3.9  CL 103 103 106  CO2 19 16* 18*  BUN 54* 62* 63*  CREATININE 3.76* 3.63* 3.64*  GLUCOSE 184* 165* 151*   Electrolytes  Recent Labs Lab 09/17/14 2150 09/18/14 0500 09/18/14 1200 09/18/14 1700  CALCIUM 6.8* 6.6* 6.8* 6.8*  MG 1.9  --   --   --   PHOS 4.4  --   --   --    Sepsis Markers  Recent Labs Lab 09/17/14  2200 09/18/14 0909 09/18/14 1300  LATICACIDVEN 4.8* 5.4* 4.4*  PROCALCITON 64.94  --   --    ABG  Recent Labs Lab 09/17/14 2132 09/18/14 0239 09/18/14 0508  PHART 7.243* 7.208* 7.312*  PCO2ART 42.2 42.6 34.7*  PO2ART 74.9* 86.2 68.2*   Liver Enzymes  Recent Labs Lab 09/17/14 2150 09/18/14 0500  AST 123* 113*  ALT 86* 83*  ALKPHOS 24* 29*  BILITOT 2.5* 2.4*  ALBUMIN 2.0* 2.1*   Cardiac Enzymes  Recent Labs Lab 09/18/14 0730  TROPONINI 0.10*   Glucose  Recent Labs Lab 09/17/14 2022 09/18/14 0742  GLUCAP 180* 159*    Imaging Dg Chest Port 1 View  09/17/2014    CLINICAL DATA:  Follow-up respiratory failure  EXAM: PORTABLE CHEST - 1 VIEW  COMPARISON:  09/17/2014  FINDINGS: Endotracheal tube tip measures 3.3 cm above the carinal. Enteric tube tip is off the field of view but below the left hemidiaphragm. Shallow inspiration. Mild cardiac enlargement with probable pulmonary vascular congestion. Perihilar infiltration suggesting edema or pneumonia. Small bilateral pleural effusions appear slightly improved since prior study. No pneumothorax. Degenerative changes in the spine.  IMPRESSION: Cardiac enlargement with pulmonary vascular congestion and bilateral pleural effusions. Slight improvement since previous study.   Electronically Signed   By: Lucienne Capers M.D.   On: 09/17/2014 21:25     ASSESSMENT / PLAN:  PULMONARY OETT 2/22 >>> A: Acute hypoxic respiratory failure COPD without evidence of exacerbation Blossoming infiltrates, vs edema Concern ARDS (aspiration) P:   Full vent support per ARDS protocol Keep plat less 30, keep 7 cc/kg IBW for now in setting of worsening resp acidosis.  DuoNebs / Albuterol Empiric abx per ID section. Repeat ABG  CARDIOVASCULAR CVL R fem 2/22  > 2/23 CVL LIJ 2/23 >>> Art line L Rad 2/23 >>> A:  Shock - unclear etiology.  ? Hypovolemic Likely this is Septic shock Hx HTN R/o cardiomyopathy, assess for pericardial effusion R/o rel AI Sinus tach  P:  Goal MAP > 65. Continue Levophed Lactate = 4.8 (2/22); cortisol pending Repeat lactic acid now, if increasing would CT R leg. CVP > decrease IVF to [facility] in setting of diffuse infiltrates. Continue vaso Start empiric steroids Hold outpatient atorvastatin. Treat fevers  RENAL A:   Mixed metabolic acidosis - due to lactate + renal failure Acute renal failure Rhabdo - improving with IVF resuscitation Hyponatremia Hypokalemia - repleted at [facility] - corrects to 7.7 P:   CK: 1482 (2/22) NS to 50cc/hr in setting ARDS BMP in AM No  hydro on CT F/u UA, urine na, osm, serum uric acid bmet q8h  GASTROINTESTINAL A:  Transaminitis - likely due to shock liver GERD Gastroenteritis? At risk ischemia nontransmural ( CT neg) P:   LFT's repeat in am  Pantoprazole. NPO. Send cdiff Nutrition consult for TF's. Send stool enteric pathogen, leukocytes  RUQ [place] pending See ID  HEMATOLOGIC A:   Anemia VTE prophylaxis Mild thrombocytopenia P:  Transfuse per usual ICU guidelines. SCD's / Heparin. CBC in AM.  INFECTIOUS A:   Septic Shock Source: r/o aspiration PNA, GI source gastroenteritis, r/o CAP R/o influenza BCx2 2/22 >>> UC 2/22 >>> Stool O&P 2/23 >>> C-dif 2/23 >>> Resp Viral panel 2/23 >>> GI pathogen panel 2/23 >>>  P:   Abx: Vanc, start date 2/22>>> Abx: Zosyn, start date 2/22>>> Abx: Levofloxacin 2/23>>> Renal dose Tamiflu 2/23 > PCT algorithm  Repeat lactic now F/u cultures/ GI workup Re-evaluate leg area in AM, may  ned CT. Xray tib/fib with soft tissue inflammation.   ENDOCRINE A:   R/o rel AI TSH 0.26 Cortisol 58.3, may be inaccurate as got solumedrol first P:   SSI if glucose consistently > 180. Check free T4 On stress dose steroids  NEUROLOGIC A:   Acute metabolic encephalopathy Hx Depression, Anxiety, Pain P:   Sedation:  Fentanyl gtt / Versed gtt RASS goal: - 4 Change versed to continuous infusion for vent synchrony May need NMB  Hold outpatient gabapentin, meclizine, sumatriptan, amitriptyline, duloxetine, MS Contin, percocet, opana ER, sertraline. Daily WUA, may  Be unable with ards settings   FAMILY  - Updates: son updated  - Inter-disciplinary family meet or Palliative Care meeting due by:  2/28.  Additional CC time: 45 mins.   Georgann Housekeeper, AGACNP-BC [facility] Pulmonology/Critical Care Pager [phone] or [phone]

## 2014-09-18 NOTE — Progress Notes (Signed)
CRITICAL VALUE ALERT  Critical value received:  Lactic Acid 5.4  Date of notification:  09/18/14  Time of notification:  1030  Critical value read back: yes  Nurse who received alert: Loel Ro, RN  MD notified (1st page):  Dr. Titus Mould  Time notified [phone]

## 2014-09-18 NOTE — Progress Notes (Signed)
PULMONARY / CRITICAL CARE MEDICINE   Name: Jamie Fields MRN: [id number] DOB: 18-Oct-1955    ADMISSION DATE:  09/17/2014 CONSULTATION DATE:  09/17/2014   REFERRING MD :  [facility]  CHIEF COMPLAINT:  Respiratory Distress  INITIAL PRESENTATION: 59 y.o. F who was taken to [facility] ED early AM hours 2/22 for respiratory distress.  She was admitted and placed on BiPAP.  Was also found to have acute renal failure, AG metabolic acidosis, Overnight, her acidosis and respiratory status worsened to the point she required intubation.  She was also placed on levophed for persistent shock.  Later in day was transferred to [facility] for further management.  STUDIES:  CT Chest 2/22 >>> no acute process CT abd / pelv 2/22 >>> iliac lymph nodes likely reactive.  No acute process CXR 2/23  >> Persistent mild vascular congestion and mild interstitial edema bilaterally. Superimposed infiltrate right upper lobe and left base cannot be excluded. Echo 9/67 >> Systolic function was normal. EF 60% to 65%. Right Tib/Fib DG >> No evidence of osteo; Mild soft tissue swelling RUQ [place] 2/23>> Liver echogenicity is diffusely increased and somewhat heterogeneous. These findings most likely are indicative of hepatic steatosis  SIGNIFICANT EVENTS: 2/22 - admitted at [place]; respiratory failure-intubated; shock; transferred to [facility] 2/23 - On [place]; intubated; Fever, CXR infiltrate = sepsis 2/24- group A strep just called from [place]  SUBJECTIVE: Intubated and sedated. Does open eyes or follow commands. Temp 102 at 4am. WBC elevated 13 from 7.   VITAL SIGNS: Temp:  [98.9 F (37.2 C)-101.8 F (38.8 C)] 98.9 F (37.2 C) (02/23 1608) Pulse Rate:  [110-137] 126 (02/23 1900) Resp:  [13-32] 15 (02/23 1900) BP: (59-159)/(37-107) 106/56 mmHg (02/23 1900) SpO2:  [87 %-99 %] 92 % (02/23 1900) Arterial Line BP: (94-142)/(52-69) 99/53 mmHg (02/23 1900) FiO2 (%):  [40 %] 40 % (02/23 1508) Weight:  [245 lb 2.4 oz (111.2  kg)-256 lb 2.8 oz (116.2 kg)] 256 lb 2.8 oz (116.2 kg) (02/23 0500) HEMODYNAMICS: CVP:  [11 mmHg-28 mmHg] 18 mmHg VENTILATOR SETTINGS: Vent Mode:  [-] PRVC FiO2 (%):  [40 %] 40 % Set Rate:  [15 bmp-30 bmp] 30 bmp Vt Set:  [430 mL-500 mL] 430 mL PEEP:  [5 cmH20] 5 cmH20 Plateau Pressure:  [9 cmH20-31 cmH20] 31 cmH20 INTAKE / OUTPUT:  Intake/Output Summary (Last 24 hours) at 09/18/14 1935 Last data filed at 09/18/14 1900  Gross per 24 hour  Intake 5476.72 ml  Output    775 ml  Net 4701.72 ml    PHYSICAL EXAMINATION: General: Obese female, restless Neuro: Sedated on vent. Rass -4 HEENT: [place]/AT. PERRL, sclerae anicteric. Cardiovascular: RRR, no M/R/G.  Lungs: coarse distant Abdomen: Obese, BS x 4, soft, NT/ND.  Musculoskeletal: No gross deformities, no edema.  Skin: erythema on ant right shin now with bulla  LABS:  CBC  Recent Labs Lab 09/17/14 2150 09/18/14 0500  WBC 5.6 7.0  HGB 9.1* 9.0*  HCT 27.3* 26.9*  PLT 109* 100*   Coag's  Recent Labs Lab 09/17/14 2150  INR 1.63*   BMET  Recent Labs Lab 09/18/14 0500 09/18/14 1200 09/18/14 1700  NA 135 135 135  K 4.2 4.0 3.9  CL 103 103 106  CO2 19 16* 18*  BUN 54* 62* 63*  CREATININE 3.76* 3.63* 3.64*  GLUCOSE 184* 165* 151*   Electrolytes  Recent Labs Lab 09/17/14 2150 09/18/14 0500 09/18/14 1200 09/18/14 1700  CALCIUM 6.8* 6.6* 6.8* 6.8*  MG 1.9  --   --   --  PHOS 4.4  --   --   --    Sepsis Markers  Recent Labs Lab 09/17/14 2200 09/18/14 0909 09/18/14 1300  LATICACIDVEN 4.8* 5.4* 4.4*  PROCALCITON 64.94  --   --    ABG  Recent Labs Lab 09/18/14 0239 09/18/14 0508 09/18/14 1841  PHART 7.208* 7.312* 7.263*  PCO2ART 42.6 34.7* 38.0  PO2ART 86.2 68.2* 65.0*   Liver Enzymes  Recent Labs Lab 09/17/14 2150 09/18/14 0500  AST 123* 113*  ALT 86* 83*  ALKPHOS 24* 29*  BILITOT 2.5* 2.4*  ALBUMIN 2.0* 2.1*   Cardiac Enzymes  Recent Labs Lab 09/18/14 0730  TROPONINI 0.10*    Glucose  Recent Labs Lab 09/17/14 2022 09/18/14 0742  GLUCAP 180* 159*    Imaging Dg Chest Port 1 View  09/17/2014   CLINICAL DATA:  Follow-up respiratory failure  EXAM: PORTABLE CHEST - 1 VIEW  COMPARISON:  09/17/2014  FINDINGS: Endotracheal tube tip measures 3.3 cm above the carinal. Enteric tube tip is off the field of view but below the left hemidiaphragm. Shallow inspiration. Mild cardiac enlargement with probable pulmonary vascular congestion. Perihilar infiltration suggesting edema or pneumonia. Small bilateral pleural effusions appear slightly improved since prior study. No pneumothorax. Degenerative changes in the spine.  IMPRESSION: Cardiac enlargement with pulmonary vascular congestion and bilateral pleural effusions. Slight improvement since previous study.   Electronically Signed   By: Lucienne Capers M.D.   On: 09/17/2014 21:25   ASSESSMENT / PLAN:  PULMONARY OETT 2/22 >>> A: Acute hypoxic respiratory failure COPD without evidence of exacerbation ARDS P:   Full vent support. CVP: last 14 Keep plat less 30, 7 cc/kg, pressors controlled, will consider to 6 cc/kg and some permissive hypecapnia- repeat ABg  DuoNebs / Albuterol- limit as able with ards pcxr in am  Even balance goals  CARDIOVASCULAR CVL R fem 2/22 >>>2/23 CVL L IJ 2/23 >> Aline L rad 2/23 >> A:  Shock - Septic +/-  Hypovolemic Hx HTN R/o rel AI (got roids before level) - treat Sinus tachy P:  Continue Levophed: weaned to 13 from 40 in last 24 Goal MAP > 65. Lactate trending down = 3.8 (2/23) Continued vaso Empiric steroids started 2/23, maintain Hold outpatient atorvastatin.  RENAL A:   Mixed metabolic acidosis - due to lactate + renal failure Acute renal failure; Baseline Cr unknown Rhabdo - improving with IVF resuscitation Hyponatremia Hypokalemia - repleted at [facility] - corrects to 7.7 P:   CK: 1482 (2/22) Cr plateau 3.6 2/23; following BMP q 8hrs No hydro  on CT UA, urine na, osm: Granular cast; FENa 0.36 % Allow even balance  GASTROINTESTINAL A:  Transaminitis - Improving GERD P:   Following LFT's   Pantoprazole. Dc stool work up [place] likely to OR See ID  HEMATOLOGIC A:   Anemia VTE prophylaxis  thrombocytopenia worsening plts < 50 P:  Transfuse per usual ICU guidelines. SCD's; holding subq Heparin 2/24 - dc CBC in AM. If to OR tranasfuse  INFECTIOUS A:   Septic Shock Source: group A strep just back, r./o nec fasc rt leg P:   BCx2 2/22 >>> UC 2/22 >>> Abx: Vanc, start date 2/22>>> Abx: Zosyn, start date 2/22>>> Levofloxacin 2/23>>>2/24 PCT = 64.9  Add clinda STAt ct  Ortho consult Ad IVIG (mODS)  ENDOCRINE A:   R/o rel AI R/o sick euthryoid P:   SSI if glucose consistently > 180. TSH low: 0.258, get T3 Cortisol 58;  Solu-Cortef 50 q6hrs  continued  NEUROLOGIC A:   Acute metabolic encephalopathy Hx Depression, Anxiety, Pain P:   Sedation:  Fentanyl gtt / Versed PRN. RASS goal: -4 Severe dyschrony, int vec NO WUA UDS: Positive for opiods Hold outpatient gabapentin, meclizine, sumatriptan, amitriptyline, duloxetine, MS Contin, percocet, opana ER, sertraline.  FAMILY  - Updates: son updated  - Inter-disciplinary family meet or Palliative Care meeting due by:  2/28.  Olam Idler, MD 09/18/2014, 7:35 PM PGY-2, [facility]   STAFF NOTE: I, Merrie Roof, MD FACP have personally reviewed patient's available data, including medical history, events of note, physical examination and test results as part of my evaluation. I have discussed with resident/NP and other care providers such as pharmacist, RN and RRT. In addition, I personally evaluated patient and elicited key findings of: Grou A strep back, ortho caled, CT likley leg, some clinical progress, add IVIG, consider straight to OR, add clinda, dc tamiflu, dc levo, maintain vanc, zosyn, bemt q8h , even balance goals, to 6  cc/kg, permissive hypercapnia goals The patient is critically ill with multiple organ systems failure and requires high complexity decision making for assessment and support, frequent evaluation and titration of therapies, application of advanced monitoring technologies and extensive interpretation of multiple databases.   Critical Care Time devoted to patient care services described in this note is40 Minutes. This time reflects time of care of this signee: Merrie Roof, MD FACP. This critical care time does not reflect procedure time, or teaching time or supervisory time of PA/NP/Med student/Med Resident etc but could involve care discussion time. Rest per NP/medical resident whose note is outlined above and that I agree with   [name]. Titus Mould, MD, [place] Pgr: [place] [facility] 09/19/2014 9:58 AM

## 2014-09-18 NOTE — Progress Notes (Signed)
eLink Physician-Brief Progress Note Patient Name: Jamie Fields DOB: [birth year] MRN: [id number]   Date of Service  09/18/2014  HPI/Events of Note  Patient with metabolic acidosis on vent with pH 7.2/42/86/16  eICU Interventions  Plan: Increase RR on vent to 30 2 amps of bicarb IVP Recheck ABG at 5am     Intervention Category Major Interventions: Acid-Base disturbance - evaluation and management  Jeni Duling 09/18/2014, 3:08 AM

## 2014-09-18 NOTE — Progress Notes (Signed)
INITIAL NUTRITION ASSESSMENT  DOCUMENTATION CODES Per approved criteria  -Morbid Obesity   INTERVENTION:  When able to start enteral nutrition, initiate TF via OGT with Vital High Protein at 25 ml/h and Prostat 30 ml BID on day 1; on day 2, d/c Prostat and increase to goal rate of 70 ml/h (1680 ml per day) to provide 1680 kcals (70% of estimated needs), 147 gm protein, 1404 ml free water daily.  NUTRITION DIAGNOSIS: Inadequate oral intake related to inability to eat as evidenced by NPO status.   Goal: Enteral nutrition to provide 60-70% of estimated calorie needs (22-25 kcals/kg ideal body weight) and 100% of estimated protein needs, based on ASPEN guidelines for hypocaloric, high protein feeding in critically ill obese individuals.  Monitor:  TF tolerance/adequacy, weight trend, labs, vent status.  Reason for Assessment: MD Consult for TF recommendations   59 y.o. female  Admitting Dx: Acute respiratory failure with hypoxia  ASSESSMENT: Patient was transferred from [facility] to [facility] on 2/22 with respiratory distress, acute renal failure, AG metabolic acidosis, and shock.  Discussed patient in ICU rounds and with RN today. Plans to hold off on starting TF today due to unknown source of sepsis. Nutrition focused physical exam completed.  No muscle or subcutaneous fat depletion noticed.  Patient is currently intubated on ventilator support MV: 15 L/min Temp (24hrs), Avg:100.4 F (38 C), Min:99.2 F (37.3 C), Max:101.8 F (38.8 C)  Propofol: none   Height: Ht Readings from Last 1 Encounters:  09/17/14 5\' 5"  (1.651 m)    Weight: Wt Readings from Last 1 Encounters:  09/18/14 256 lb 2.8 oz (116.2 kg)   Admission weight: 245 lb 2.4 oz (111.2 kg)  Ideal Body Weight: 56.8 kg  % Ideal Body Weight: 205%  Wt Readings from Last 10 Encounters:  09/18/14 256 lb 2.8 oz (116.2 kg)    Usual Body Weight: unknown  % Usual Body Weight: N/A  BMI:  Body mass index is  42.63 kg/(m^2).  Estimated Nutritional Needs: Kcal: 2404 Protein: 140 gm Fluid: >/= 2 L  Skin: no issues  Diet Order: Diet NPO time specified  EDUCATION NEEDS: -Education not appropriate at this time   Intake/Output Summary (Last 24 hours) at 09/18/14 1025 Last data filed at 09/18/14 1000  Gross per 24 hour  Intake 3793.02 ml  Output    260 ml  Net 3533.02 ml    Last BM: PTA   Labs:   Recent Labs Lab 09/17/14 2150 09/18/14 0500  NA 134* 135  K 4.4 4.2  CL 103 103  CO2 19 19  BUN 50* 54*  CREATININE 3.63* 3.76*  CALCIUM 6.8* 6.6*  MG 1.9  --   PHOS 4.4  --   GLUCOSE 198* 184*    CBG (last 3)   Recent Labs  09/17/14 2022 09/18/14 0742  GLUCAP 180* 159*    Scheduled Meds: . antiseptic oral rinse  7 mL Mouth Rinse QID  . chlorhexidine  15 mL Mouth Rinse BID  . fentaNYL  50 mcg Intravenous Once  . heparin  5,000 Units Subcutaneous 3 times per day  . hydrocortisone sod succinate (SOLU-CORTEF) inj  50 mg Intravenous Q6H  . ipratropium-albuterol  3 mL Nebulization Q6H  . [START ON 09/19/2014] levofloxacin (LEVAQUIN) IV  750 mg Intravenous Q48H  . pantoprazole (PROTONIX) IV  40 mg Intravenous QHS  . piperacillin-tazobactam (ZOSYN)  IV  3.375 g Intravenous 3 times per day  . sodium bicarbonate  Continuous Infusions: . sodium chloride 125 mL/hr at 09/18/14 0600  . fentaNYL infusion INTRAVENOUS 200 mcg/hr (09/18/14 0830)  . norepinephrine (LEVOPHED) Adult infusion 35 mcg/min (09/18/14 0600)  . vasopressin (PITRESSIN) infusion - *FOR SHOCK* 0.03 Units/min (09/18/14 0600)    No past medical history on file.  No past surgical history on file.   Molli Barrows, RD, LDN, [place] Pager [phone] After Hours Pager [phone]

## 2014-09-18 NOTE — Progress Notes (Addendum)
ANTIBIOTIC CONSULT NOTE - FOLLOW UP  Pharmacy Consult for Levaquin, Vancomycin, Zosyn Indication: rule out sepsis  No Known Allergies  Patient Measurements: Height: 5\' 5"  (165.1 cm) Weight: 256 lb 2.8 oz (116.2 kg) IBW/kg (Calculated) : 57 Vital Signs: Temp: 101.8 F (38.8 C) (02/23 0743) Temp Source: Oral (02/23 0743) BP: 110/60 mmHg (02/23 0730) Pulse Rate: 136 (02/23 0730) Intake/Output from previous day: 02/22 0701 - 02/23 0700 In: 3060.4 [I.V.:1460.4; IV Piggyback:1600] Out: 160 [Urine:160] Intake/Output from this shift:    Labs:  Recent Labs  09/17/14 2150 09/18/14 0500  WBC 5.6 7.0  HGB 9.1* 9.0*  PLT 109* 100*  CREATININE 3.63* 3.76*   Estimated Creatinine Clearance: 20.5 mL/min (by C-G formula based on Cr of 3.76). No results for input(s): VANCOTROUGH, VANCOPEAK, VANCORANDOM, GENTTROUGH, GENTPEAK, GENTRANDOM, TOBRATROUGH, TOBRAPEAK, TOBRARND, AMIKACINPEAK, AMIKACINTROU, AMIKACIN in the last 72 hours.   Microbiology: Recent Results (from the past 720 hour(s))  MRSA PCR Screening     Status: None   Collection Time: 09/17/14  9:25 PM  Result Value Ref Range Status   MRSA by PCR NEGATIVE NEGATIVE Final    Comment:        The GeneXpert MRSA Assay (FDA approved for NASAL specimens only), is one component of a comprehensive MRSA colonization surveillance program. It is not intended to diagnose MRSA infection nor to guide or monitor treatment for MRSA infections.     Anti-infectives    Start     Dose/Rate Route Frequency Ordered Stop   09/19/14 0300  levofloxacin (LEVAQUIN) IVPB 750 mg     750 mg 100 mL/hr over 90 Minutes Intravenous Every 48 hours 09/18/14 0919     09/17/14 2200  piperacillin-tazobactam (ZOSYN) IVPB 3.375 g     3.375 g 12.5 mL/hr over 240 Minutes Intravenous 3 times per day 09/17/14 2143     09/17/14 2145  vancomycin (VANCOCIN) 2,000 mg in sodium chloride 0.9 % 500 mL IVPB     2,000 mg 250 mL/hr over 120 Minutes Intravenous  Once  09/17/14 2143 09/18/14 0200      Assessment: 59 year old female with septic shock transferred from [facility] with unknown source most suspicious for gastric enteritis or pneumonia. Tmax 101.8, wbc within normal limit. PCT 65, LA 4.8. MRSA pcr (-), Flu (-) per [facility] records. CK 4408 at [facility] >> trending down.  2/22 Vanc >> 2/22 Zosyn >> 2/22 Levaquin 750mg  at [place] 02:46 >>  2/23 Urine >> 2/23 TA >> 2/23 Blood >> 2/23 Stool >> 2/23 Cdiff >>  Goal of Therapy:  Vancomycin trough level 15-20 mcg/ml  Clinical resolution of infection  Plan:  Levaquin 750mg  IV every 48 hours - next dose 2/24 0300.  Follow-up renal function for Vancomycin. May need to check level at 48 hours to determine next dose.  Continue Zosyn 3.375g IV every 8 hours - EI -may need to reduce dose if further increase in SCr.   Sloan Leiter, PharmD, BCPS Clinical Pharmacist [phone]  09/18/2014,9:19 AM  Addendum:  SCr rising and very little UOP - will decrease Zosyn for worsening renal function as do not think she can tolerate the extended infusion.   Plan: Zosyn 2.25g IV q6h.

## 2014-09-18 NOTE — Progress Notes (Signed)
UR Completed.  [phone]  

## 2014-09-18 NOTE — Progress Notes (Signed)
CRITICAL VALUE ALERT  Critical value received:  Lactic Acid 4.4  Date of notification:  09/18/14  Time of notification:  8366  Critical value read back: Yes  Nurse who received alert: Deboraha Sprang, RN  MD notified Phill Myron

## 2014-09-19 ENCOUNTER — Inpatient Hospital Stay (HOSPITAL_COMMUNITY): Payer: Commercial Managed Care - HMO

## 2014-09-19 DIAGNOSIS — Z978 Presence of other specified devices: Secondary | ICD-10-CM | POA: Insufficient documentation

## 2014-09-19 DIAGNOSIS — L0390 Cellulitis, unspecified: Secondary | ICD-10-CM | POA: Insufficient documentation

## 2014-09-19 DIAGNOSIS — L03115 Cellulitis of right lower limb: Secondary | ICD-10-CM

## 2014-09-19 DIAGNOSIS — Z789 Other specified health status: Secondary | ICD-10-CM

## 2014-09-19 LAB — POCT I-STAT 3, ART BLOOD GAS (G3+)
Acid-base deficit: 7 mmol/L — ABNORMAL HIGH (ref 0.0–2.0)
Bicarbonate: 19.8 mEq/L — ABNORMAL LOW (ref 20.0–24.0)
O2 SAT: 85 %
PCO2 ART: 49.6 mmHg — AB (ref 35.0–45.0)
Patient temperature: 100.1
TCO2: 21 mmol/L (ref 0–100)
pH, Arterial: 7.214 — ABNORMAL LOW (ref 7.350–7.450)
pO2, Arterial: 63 mmHg — ABNORMAL LOW (ref 80.0–100.0)

## 2014-09-19 LAB — BASIC METABOLIC PANEL
ANION GAP: 9 (ref 5–15)
ANION GAP: 13 (ref 5–15)
Anion gap: 10 (ref 5–15)
BUN: 71 mg/dL — ABNORMAL HIGH (ref 6–23)
BUN: 74 mg/dL — ABNORMAL HIGH (ref 6–23)
BUN: 78 mg/dL — AB (ref 6–23)
CHLORIDE: 103 mmol/L (ref 96–112)
CHLORIDE: 102 mmol/L (ref 96–112)
CO2: 19 mmol/L (ref 19–32)
CO2: 21 mmol/L (ref 19–32)
CO2: 22 mmol/L (ref 19–32)
CREATININE: 3.52 mg/dL — AB (ref 0.50–1.10)
CREATININE: 3.67 mg/dL — AB (ref 0.50–1.10)
Calcium: 6.8 mg/dL — ABNORMAL LOW (ref 8.4–10.5)
Calcium: 7.1 mg/dL — ABNORMAL LOW (ref 8.4–10.5)
Calcium: 7.2 mg/dL — ABNORMAL LOW (ref 8.4–10.5)
Chloride: 105 mmol/L (ref 96–112)
Creatinine, Ser: 3.67 mg/dL — ABNORMAL HIGH (ref 0.50–1.10)
GFR calc Af Amer: 15 mL/min — ABNORMAL LOW (ref >90)
GFR calc Af Amer: 15 mL/min — ABNORMAL LOW (ref >90)
GFR calc non Af Amer: 13 mL/min — ABNORMAL LOW (ref >90)
GFR, EST AFRICAN AMERICAN: 15 mL/min — AB (ref >90)
GFR, EST NON AFRICAN AMERICAN: 13 mL/min — AB (ref >90)
GFR, EST NON AFRICAN AMERICAN: 13 mL/min — AB (ref >90)
GLUCOSE: 173 mg/dL — AB (ref 70–99)
Glucose, Bld: 148 mg/dL — ABNORMAL HIGH (ref 70–99)
Glucose, Bld: 140 mg/dL — ABNORMAL HIGH (ref 70–99)
POTASSIUM: 3.8 mmol/L (ref 3.5–5.1)
Potassium: 3.9 mmol/L (ref 3.5–5.1)
Potassium: 4.1 mmol/L (ref 3.5–5.1)
SODIUM: 135 mmol/L (ref 135–145)
SODIUM: 135 mmol/L (ref 135–145)
Sodium: 134 mmol/L — ABNORMAL LOW (ref 135–145)

## 2014-09-19 LAB — GLUCOSE, CAPILLARY
GLUCOSE-CAPILLARY: 147 mg/dL — AB (ref 70–99)
GLUCOSE-CAPILLARY: 161 mg/dL — AB (ref 70–99)

## 2014-09-19 LAB — AMYLASE: Amylase: 18 U/L (ref 0–105)

## 2014-09-19 LAB — CBC
HEMATOCRIT: 24.8 % — AB (ref 36.0–46.0)
Hemoglobin: 8.3 g/dL — ABNORMAL LOW (ref 12.0–15.0)
MCH: 26.3 pg (ref 26.0–34.0)
MCHC: 33.5 g/dL (ref 30.0–36.0)
MCV: 78.5 fL (ref 78.0–100.0)
Platelets: 45 10*3/uL — ABNORMAL LOW (ref 150–400)
RBC: 3.16 MIL/uL — ABNORMAL LOW (ref 3.87–5.11)
RDW: 15.3 % (ref 11.5–15.5)
WBC: 13.3 10*3/uL — ABNORMAL HIGH (ref 4.0–10.5)

## 2014-09-19 LAB — HEPATIC FUNCTION PANEL
ALT: 61 U/L — ABNORMAL HIGH (ref 0–35)
AST: 62 U/L — ABNORMAL HIGH (ref 0–37)
Albumin: 1.8 g/dL — ABNORMAL LOW (ref 3.5–5.2)
Alkaline Phosphatase: 36 U/L — ABNORMAL LOW (ref 39–117)
BILIRUBIN DIRECT: 1.5 mg/dL — AB (ref 0.0–0.5)
BILIRUBIN INDIRECT: 0.8 mg/dL (ref 0.3–0.9)
BILIRUBIN TOTAL: 2.3 mg/dL — AB (ref 0.3–1.2)
Total Protein: 4.3 g/dL — ABNORMAL LOW (ref 6.0–8.3)

## 2014-09-19 LAB — URINE CULTURE
Colony Count: NO GROWTH
Culture: NO GROWTH
SPECIAL REQUESTS: NORMAL

## 2014-09-19 LAB — T4, FREE: Free T4: 0.74 ng/dL — ABNORMAL LOW (ref 0.80–1.80)

## 2014-09-19 LAB — TROPONIN I: Troponin I: 0.09 ng/mL — ABNORMAL HIGH (ref <0.031)

## 2014-09-19 LAB — LIPASE, BLOOD: LIPASE: 13 U/L (ref 11–59)

## 2014-09-19 MED ORDER — IMMUNE GLOBULIN (HUMAN) 10 GM/100ML IV SOLN: 1.0000 g/kg | INTRAVENOUS | Status: DC

## 2014-09-19 MED ORDER — IMMUNE GLOBULIN (HUMAN) 20 GM/200ML IV SOLN
100.0000 g | INTRAVENOUS | Status: AC
  Administered 2014-09-19 (×2): 100 g via INTRAVENOUS
  Filled 2014-09-19: qty 1000

## 2014-09-19 MED ORDER — VECURONIUM BROMIDE 10 MG IV SOLR
7.0000 mg | INTRAVENOUS | Status: DC | PRN
  Administered 2014-09-19 – 2014-09-21 (×2): 7 mg via INTRAVENOUS

## 2014-09-19 MED ORDER — VECURONIUM BROMIDE 10 MG IV SOLR
INTRAVENOUS | Status: AC
  Filled 2014-09-19: qty 10

## 2014-09-19 MED ORDER — CLINDAMYCIN PHOSPHATE 900 MG/50ML IV SOLN
900.0000 mg | Freq: Three times a day (TID) | INTRAVENOUS | Status: DC
  Administered 2014-09-19 – 2014-09-21 (×6): 900 mg via INTRAVENOUS
  Filled 2014-09-19 (×10): qty 50

## 2014-09-19 MED ORDER — OXEPA PO LIQD
1000.0000 mL | ORAL | Status: DC
  Administered 2014-09-19 – 2014-09-20 (×2): 1000 mL
  Filled 2014-09-19 (×3): qty 1000

## 2014-09-19 NOTE — Progress Notes (Signed)
RT called to patient's room for desaturation issues. RT did recruitment maneuver. Sats returned to 97%, but immediately fell to low 90's upper 80's. RT placed pt on 80% and 10 of peep with an 02 sat of 95. RN aware. RT will continue to monitor.

## 2014-09-19 NOTE — Significant Event (Signed)
CRITICAL VALUE ALERT  Critical value received:  positive blood cultures Group A Strep in aerobic bottle;  from [facility] on 09/17/14   Date of notification: 09/19/14  Time of notification:  0930  Critical value read back: yes  Nurse who received alert:  Lenor Coffin, RN  MD notified:  Dr Titus Mould   Time:  5747

## 2014-09-19 NOTE — Progress Notes (Signed)
Patient transported to CT and returned to 1R94 without complications. Patient tolerated well. Vital signs stable at this time. RT will continue to monitor.

## 2014-09-19 NOTE — Consult Note (Signed)
ORTHOPAEDIC CONSULTATION  REQUESTING PHYSICIAN: Collene Gobble, MD  Chief Complaint: right leg infection   HPI: Jamie Fields is a 59 y.o. female who was transferred from [facility] in critical condition. She has respiratory failure and is intubated and sedated. Multi system failure, Strep septicemia. She was noted to have erythema and swelling of the right lower leg. Blistering has occurred.   No past medical history on file. No past surgical history on file. History   Social History  . Marital Status: Widowed    Spouse Name: N/A  . Number of Children: N/A  . Years of Education: N/A   Social History Main Topics  . Smoking status: Not on file  . Smokeless tobacco: Not on file  . Alcohol Use: Not on file  . Drug Use: Not on file  . Sexual Activity: Not on file   Other Topics Concern  . Not on file   Social History Narrative  . No narrative on file   No family history on file. No Known Allergies Prior to Admission medications   Medication Sig Start Date End Date Taking? Authorizing Provider  albuterol (PROVENTIL) (2.5 MG/3ML) 0.083% nebulizer solution Take 2.5 mg by nebulization every 4 (four) hours as needed for wheezing or shortness of breath.  09/05/14  Yes Historical Provider, MD  amitriptyline (ELAVIL) 25 MG tablet Take 50 mg by mouth at bedtime.  09/05/14  Yes Historical Provider, MD  atorvastatin (LIPITOR) 10 MG tablet Take 10 mg by mouth at bedtime.  09/05/14  Yes Historical Provider, MD  diclofenac (VOLTAREN) 75 MG EC tablet Take 75 mg by mouth 2 (two) times daily as needed (pain).  09/05/14  Yes Historical Provider, MD  DULoxetine (CYMBALTA) 30 MG capsule Take 60 mg by mouth daily.  09/05/14  Yes Historical Provider, MD  esomeprazole (NEXIUM) 40 MG capsule Take 40 mg by mouth daily.  09/05/14  Yes Historical Provider, MD  gabapentin (NEURONTIN) 800 MG tablet Take 800 mg by mouth 3 (three) times daily.  09/05/14  Yes Historical Provider, MD  ibuprofen  (ADVIL,MOTRIN) 200 MG tablet Take 200 mg by mouth every 4 (four) hours as needed for fever (pain).   Yes Historical Provider, MD  Isopropyl Alcohol 95 % LIQD Place 4-5 drops in ear(s) as needed (water in ears).   Yes Historical Provider, MD  meclizine (ANTIVERT) 25 MG tablet Take 25 mg by mouth daily.  09/05/14  Yes Historical Provider, MD  morphine (MS CONTIN) 15 MG 12 hr tablet Take 15 mg by mouth every 12 (twelve) hours.  09/12/14  Yes Historical Provider, MD  ondansetron (ZOFRAN) 8 MG tablet Take 8 mg by mouth 3 (three) times daily as needed for nausea or vomiting.   Yes Historical Provider, MD  OPANA ER, CRUSH RESISTANT, 10 MG T12A 12 hr tablet Take 10 mg by mouth 2 (two) times daily.  09/06/14  Yes Historical Provider, MD  oxyCODONE-acetaminophen (PERCOCET) 10-325 MG per tablet Take 1 tablet by mouth every 6 (six) hours as needed for pain.  09/12/14  Yes Historical Provider, MD  oxymetazoline (AFRIN) 0.05 % nasal spray Place 1 spray into both nostrils 2 (two) times daily.   Yes Historical Provider, MD  phenylephrine (NEO-SYNEPHRINE) 0.5 % nasal solution Place 2 drops into both nostrils every 12 (twelve) hours as needed for congestion.   Yes Historical Provider, MD  Phenylephrine-Guaifenesin 5-200 MG TABS Take 2 tablets by mouth every 4 (four) hours as needed (congestion/cough).   Yes Historical  Provider, MD  sertraline (ZOLOFT) 100 MG tablet Take 200 mg by mouth daily.  09/05/14  Yes Historical Provider, MD  SUMAtriptan (IMITREX) 50 MG tablet Take 50 mg by mouth every 2 (two) hours as needed for migraine. Do not exceed 200 mg in 24 hours 09/05/14  Yes Historical Provider, MD  tamsulosin (FLOMAX) 0.4 MG CAPS capsule Take 0.4 mg by mouth daily. 30 minutes after the same meal every day 09/05/14  Yes Historical Provider, MD  traMADol (ULTRAM) 50 MG tablet Take 50-100 mg by mouth 2 (two) times daily. Alternate with oxycodone - do not take together 07/06/14  Yes Historical Provider, MD   Dg Chest Port 1  View  09/19/2014   CLINICAL DATA:  Hypoxia  EXAM: PORTABLE CHEST - 1 VIEW  COMPARISON:  September 18, 2014  FINDINGS: Endotracheal tube tip is 4.2 cm above the carina. Nasogastric tube tip and side port are below the diaphragm. Central catheter tip is in the superior vena cava just beyond the junction with the left innominate vein, stable. No pneumothorax. There remains moderate interstitial edema, more on the left than on the right. There is a patchy area of consolidation right upper lobe. Heart is mildly enlarged with pulmonary vascularity within normal limits.  IMPRESSION: Interstitial edema. There is a focal area of airspace consolidation in the right upper lobe which may represent developing pneumonia. No change in cardiac silhouette. Suspect a degree of congestive heart failure. There may be superimposed ARDS. Tube and catheter positions are as described without pneumothorax.   Electronically Signed   By: Lowella Grip III M.D.   On: 09/19/2014 08:11   Dg Chest Port 1 View  09/18/2014   CLINICAL DATA:  Central line placement  EXAM: PORTABLE CHEST - 1 VIEW  COMPARISON:  09/17/2014  FINDINGS: Cardiomediastinal silhouette is stable. Stable endotracheal tube and NG tube position. Persistent mild vascular congestion and mild interstitial edema bilaterally. Superimposed infiltrate right upper lobe and left base cannot be excluded. There is new left IJ central line with tip in SVC. No pneumothorax.  IMPRESSION: Persistent mild vascular congestion and mild interstitial edema bilaterally. Superimposed infiltrate right upper lobe and left base cannot be excluded. There is new left IJ central line with tip in SVC. No pneumothorax.   Electronically Signed   By: Lahoma Crocker M.D.   On: 09/18/2014 11:53   Dg Chest Port 1 View  09/17/2014   CLINICAL DATA:  Follow-up respiratory failure  EXAM: PORTABLE CHEST - 1 VIEW  COMPARISON:  09/17/2014  FINDINGS: Endotracheal tube tip measures 3.3 cm above the carinal. Enteric  tube tip is off the field of view but below the left hemidiaphragm. Shallow inspiration. Mild cardiac enlargement with probable pulmonary vascular congestion. Perihilar infiltration suggesting edema or pneumonia. Small bilateral pleural effusions appear slightly improved since prior study. No pneumothorax. Degenerative changes in the spine.  IMPRESSION: Cardiac enlargement with pulmonary vascular congestion and bilateral pleural effusions. Slight improvement since previous study.   Electronically Signed   By: Lucienne Capers M.D.   On: 09/17/2014 21:25   Dg Tibia/fibula Right Port  09/18/2014   CLINICAL DATA:  Right lower leg pain and redness.  EXAM: PORTABLE RIGHT TIBIA AND FIBULA - 2 VIEW  COMPARISON:  11/08/2013 knee radiographs  FINDINGS: There is evidence of fracture, subluxation or dislocation.  No radiographic evidence of osteomyelitis is noted.  Mild soft tissue swelling is present.  Degenerative changes in the knee again noted.  IMPRESSION: Mild soft tissue swelling without acute  bony abnormality.   Electronically Signed   By: Margarette Canada M.D.   On: 09/18/2014 14:25   US Abdomen Limited Ruq  09/19/2014   CLINICAL DATA:  Elevated liver enzymes  EXAM: US ABDOMEN LIMITED - RIGHT UPPER QUADRANT  COMPARISON:  CT abdomen and pelvis September 17, 2014  FINDINGS: Gallbladder:  Surgically absent.  Common bile duct:  Diameter: 5 mm. There is no intrahepatic or extrahepatic biliary duct dilatation.  Liver:  No focal lesion identified. Liver echogenicity is diffusely increased and somewhat heterogeneous.  IMPRESSION: Liver echogenicity is diffusely increased and somewhat heterogeneous. These findings most likely are indicative of hepatic steatosis. While no focal liver lesions are identified, it must be cautioned that the sensitivity of ultrasound for focal liver lesions is diminished in this circumstance. Gallbladder is absent.   Electronically Signed   By: Lowella Grip III M.D.   On: 09/19/2014 07:04     Positive ROS: All other systems have been reviewed and were otherwise negative with the exception of those mentioned in the HPI and as above.  Labs cbc  Recent Labs  09/18/14 0500 09/19/14 0327  WBC 7.0 13.3*  HGB 9.0* 8.3*  HCT 26.9* 24.8*  PLT 100* 45*    Labs inflam No results for input(s): CRP in the last 72 hours.  Invalid input(s): ESR  Labs coag  Recent Labs  09/17/14 2150  INR 1.63*     Recent Labs  09/19/14 0055 09/19/14 0745  NA 135 135  K 3.9 3.8  CL 105 103  CO2 21 19  GLUCOSE 148* 140*  BUN 71* 74*  CREATININE 3.67* 3.67*  CALCIUM 6.8* 7.1*    Physical Exam: Filed Vitals:   09/19/14 0833  BP: 109/57  Pulse: 115  Temp:   Resp: 31   General: sedated Cardiovascular: No pedal edema Respiratory: intubated GI: No organomegaly, abdomen is soft and non-tender Skin: No lesions in the area of chief complaint other than those listed below in MSK exam.  Neurologic: exam limited by sedation Psychiatric: sedated Lymphatic: No axillary or cervical lymphadenopathy  MUSCULOSKELETAL:  RLE: exam limited by sedation, WWP, some light pre-tibial erythema with two blisters with serous fluid, non-bloody. Soft tissue edema. No crepitous, no necrotic appearing tissue, no palpable fluctuance.  Other extremities are atraumatic  Assessment: RLE: cellulitis  Plan: Continue Abx and observation, no surgical indication at this time.  I do not see any indication for debridement at this time, CT does not show gas or abscess. Should she worsen clinically we could re-evaluate her leg and consider MRI.   I will continue to follow, please notify me of any rapid clinical changes.    Edmonia Lynch, D, MD Cell [phone]   09/19/2014 9:35 AM

## 2014-09-20 ENCOUNTER — Inpatient Hospital Stay (HOSPITAL_COMMUNITY): Payer: Commercial Managed Care - HMO

## 2014-09-20 LAB — BLOOD GAS, ARTERIAL
Acid-base deficit: 7.2 mmol/L — ABNORMAL HIGH (ref 0.0–2.0)
Bicarbonate: 19.4 mEq/L — ABNORMAL LOW (ref 20.0–24.0)
DRAWN BY: 36496
FIO2: 0.6 %
LHR: 30 {breaths}/min
MECHVT: 370 mL
O2 Saturation: 92.5 %
PATIENT TEMPERATURE: 98.6
PCO2 ART: 50.4 mmHg — AB (ref 35.0–45.0)
PEEP: 10 cmH2O
PH ART: 7.209 — AB (ref 7.350–7.450)
TCO2: 20.9 mmol/L (ref 0–100)
pO2, Arterial: 76.9 mmHg — ABNORMAL LOW (ref 80.0–100.0)

## 2014-09-20 LAB — RESPIRATORY VIRUS PANEL
ADENOVIRUS: NEGATIVE
Influenza A: NEGATIVE
Influenza B: NEGATIVE
METAPNEUMOVIRUS: NEGATIVE
Parainfluenza 1: NEGATIVE
Parainfluenza 2: NEGATIVE
Parainfluenza 3: NEGATIVE
RESPIRATORY SYNCYTIAL VIRUS A: NEGATIVE
RHINOVIRUS: NEGATIVE
Respiratory Syncytial Virus B: NEGATIVE

## 2014-09-20 LAB — CBC WITH DIFFERENTIAL/PLATELET
BASOS ABS: 0 10*3/uL (ref 0.0–0.1)
Basophils Relative: 0 % (ref 0–1)
Eosinophils Absolute: 0 10*3/uL (ref 0.0–0.7)
Eosinophils Relative: 0 % (ref 0–5)
HCT: 22 % — ABNORMAL LOW (ref 36.0–46.0)
HEMOGLOBIN: 7.4 g/dL — AB (ref 12.0–15.0)
Lymphocytes Relative: 7 % — ABNORMAL LOW (ref 12–46)
Lymphs Abs: 0.9 10*3/uL (ref 0.7–4.0)
MCH: 26.2 pg (ref 26.0–34.0)
MCHC: 33.6 g/dL (ref 30.0–36.0)
MCV: 78 fL (ref 78.0–100.0)
MONOS PCT: 8 % (ref 3–12)
Monocytes Absolute: 1.1 10*3/uL — ABNORMAL HIGH (ref 0.1–1.0)
NEUTROS ABS: 11.5 10*3/uL — AB (ref 1.7–7.7)
NEUTROS PCT: 85 % — AB (ref 43–77)
PLATELETS: 18 10*3/uL — AB (ref 150–400)
RBC: 2.82 MIL/uL — AB (ref 3.87–5.11)
RDW: 15.8 % — ABNORMAL HIGH (ref 11.5–15.5)
WBC: 13.5 10*3/uL — ABNORMAL HIGH (ref 4.0–10.5)

## 2014-09-20 LAB — BASIC METABOLIC PANEL
ANION GAP: 12 (ref 5–15)
Anion gap: 6 (ref 5–15)
BUN: 82 mg/dL — ABNORMAL HIGH (ref 6–23)
BUN: 89 mg/dL — ABNORMAL HIGH (ref 6–23)
CALCIUM: 7.3 mg/dL — AB (ref 8.4–10.5)
CALCIUM: 7.4 mg/dL — AB (ref 8.4–10.5)
CO2: 19 mmol/L (ref 19–32)
CO2: 21 mmol/L (ref 19–32)
CREATININE: 3.29 mg/dL — AB (ref 0.50–1.10)
Chloride: 103 mmol/L (ref 96–112)
Chloride: 106 mmol/L (ref 96–112)
Creatinine, Ser: 3.34 mg/dL — ABNORMAL HIGH (ref 0.50–1.10)
GFR calc Af Amer: 16 mL/min — ABNORMAL LOW (ref >90)
GFR calc Af Amer: 17 mL/min — ABNORMAL LOW (ref >90)
GFR, EST NON AFRICAN AMERICAN: 14 mL/min — AB (ref >90)
GFR, EST NON AFRICAN AMERICAN: 14 mL/min — AB (ref >90)
Glucose, Bld: 217 mg/dL — ABNORMAL HIGH (ref 70–99)
Glucose, Bld: 224 mg/dL — ABNORMAL HIGH (ref 70–99)
POTASSIUM: 3.5 mmol/L (ref 3.5–5.1)
Potassium: 3.5 mmol/L (ref 3.5–5.1)
SODIUM: 134 mmol/L — AB (ref 135–145)
Sodium: 133 mmol/L — ABNORMAL LOW (ref 135–145)

## 2014-09-20 LAB — HIV ANTIBODY (ROUTINE TESTING W REFLEX): HIV SCREEN 4TH GENERATION: NONREACTIVE

## 2014-09-20 LAB — POCT I-STAT 3, ART BLOOD GAS (G3+)
ACID-BASE DEFICIT: 10 mmol/L — AB (ref 0.0–2.0)
Bicarbonate: 19.4 mEq/L — ABNORMAL LOW (ref 20.0–24.0)
O2 Saturation: 88 %
TCO2: 21 mmol/L (ref 0–100)
pCO2 arterial: 57.1 mmHg (ref 35.0–45.0)
pH, Arterial: 7.135 — CL (ref 7.350–7.450)
pO2, Arterial: 69 mmHg — ABNORMAL LOW (ref 80.0–100.0)

## 2014-09-20 LAB — CULTURE, RESPIRATORY W GRAM STAIN

## 2014-09-20 LAB — CULTURE, RESPIRATORY

## 2014-09-20 LAB — GLUCOSE, CAPILLARY
GLUCOSE-CAPILLARY: 184 mg/dL — AB (ref 70–99)
GLUCOSE-CAPILLARY: 207 mg/dL — AB (ref 70–99)
GLUCOSE-CAPILLARY: 210 mg/dL — AB (ref 70–99)
Glucose-Capillary: 209 mg/dL — ABNORMAL HIGH (ref 70–99)
Glucose-Capillary: 188 mg/dL — ABNORMAL HIGH (ref 70–99)
Glucose-Capillary: 212 mg/dL — ABNORMAL HIGH (ref 70–99)

## 2014-09-20 LAB — DIC (DISSEMINATED INTRAVASCULAR COAGULATION)PANEL
Platelets: 21 10*3/uL — CL (ref 150–400)
Smear Review: NONE SEEN
aPTT: 37 seconds (ref 24–37)

## 2014-09-20 LAB — LACTIC ACID, PLASMA: LACTIC ACID, VENOUS: 1.7 mmol/L (ref 0.5–2.0)

## 2014-09-20 LAB — DIC (DISSEMINATED INTRAVASCULAR COAGULATION) PANEL
D DIMER QUANT: 3.99 ug{FEU}/mL — AB (ref 0.00–0.48)
Fibrinogen: 642 mg/dL — ABNORMAL HIGH (ref 204–475)
INR: 1.3 (ref 0.00–1.49)
PROTHROMBIN TIME: 16.3 s — AB (ref 11.6–15.2)

## 2014-09-20 LAB — VANCOMYCIN, RANDOM: VANCOMYCIN RM: 9.5 ug/mL

## 2014-09-20 LAB — T3: T3 TOTAL: 37 ng/dL — AB (ref 71–180)

## 2014-09-20 MED ORDER — ATROPINE SULFATE 0.1 MG/ML IJ SOLN
INTRAMUSCULAR | Status: AC
  Filled 2014-09-20: qty 10

## 2014-09-20 MED ORDER — IMMUNE GLOBULIN (HUMAN) 20 GM/200ML IV SOLN
100.0000 g | INTRAVENOUS | Status: AC
  Administered 2014-09-20: 100 g via INTRAVENOUS
  Filled 2014-09-20: qty 1000

## 2014-09-20 MED ORDER — VANCOMYCIN HCL 10 G IV SOLR
1500.0000 mg | INTRAVENOUS | Status: DC
  Administered 2014-09-20: 1500 mg via INTRAVENOUS
  Filled 2014-09-20 (×3): qty 1500

## 2014-09-20 MED ORDER — INSULIN ASPART 100 UNIT/ML ~~LOC~~ SOLN
0.0000 [IU] | SUBCUTANEOUS | Status: DC
  Administered 2014-09-20 (×2): 5 [IU] via SUBCUTANEOUS
  Administered 2014-09-20 (×2): 3 [IU] via SUBCUTANEOUS
  Administered 2014-09-21: 2 [IU] via SUBCUTANEOUS
  Administered 2014-09-21 (×3): 3 [IU] via SUBCUTANEOUS
  Administered 2014-09-21: 5 [IU] via SUBCUTANEOUS
  Administered 2014-09-21 – 2014-09-22 (×4): 2 [IU] via SUBCUTANEOUS
  Administered 2014-09-22: 3 [IU] via SUBCUTANEOUS
  Administered 2014-09-22 – 2014-09-23 (×3): 2 [IU] via SUBCUTANEOUS
  Administered 2014-09-23 – 2014-09-24 (×7): 3 [IU] via SUBCUTANEOUS

## 2014-09-20 MED ORDER — PIPERACILLIN-TAZOBACTAM 3.375 G IVPB
3.3750 g | Freq: Three times a day (TID) | INTRAVENOUS | Status: DC
  Administered 2014-09-20 – 2014-09-21 (×3): 3.375 g via INTRAVENOUS
  Filled 2014-09-20 (×5): qty 50

## 2014-09-20 NOTE — Progress Notes (Signed)
PULMONARY / CRITICAL CARE MEDICINE   Name: Jamie Fields MRN: [id number] DOB: 08-16-1955    ADMISSION DATE:  09/17/2014 CONSULTATION DATE:  09/17/2014   REFERRING MD :  [facility]  CHIEF COMPLAINT:  Respiratory Distress  INITIAL PRESENTATION: 59 y.o. F who was taken to [facility] ED early AM hours 2/22 for respiratory distress.  She was admitted and placed on BiPAP.  Was also found to have acute renal failure, AG metabolic acidosis, Overnight, her acidosis and respiratory status worsened to the point she required intubation.  She was also placed on levophed for persistent shock.  Later in day was transferred to [facility] for further management.  STUDIES:  CT Chest 2/22 >>> no acute process CT abd / pelv 2/22 >>> iliac lymph nodes likely reactive.  No acute process CXR 2/23  >> Persistent mild vascular congestion and mild interstitial edema bilaterally. Superimposed infiltrate right upper lobe and left base cannot be excluded. Echo 0/34 >> Systolic function was normal. EF 60% to 65%. Right Tib/Fib DG >> No evidence of osteo; Mild soft tissue swelling RUQ [place] 2/23>> Liver echogenicity is diffusely increased and somewhat heterogeneous. These findings most likely are indicative of hepatic steatosis CT tib/fib Rt >> Negative for osteomyelitis or necrotizing fasciitis. Superficial edema and blistering  SIGNIFICANT EVENTS: 2/22 - admitted at [place]; respiratory failure-intubated; shock; transferred to [facility] 2/23 - On [facility]; intubated; Fever, CXR infiltrate = sepsis 2/24- group A strep just called from [place]- add Clinda; CT neg for nec fas; Tube feeds started  SUBJECTIVE: Intubated and sedated. Does open eyes or follow commands. Afeb in last 24hrs. Levo down to 6.5  VITAL SIGNS: Temp:  [97.2 F (36.2 C)-100.1 F (37.8 C)] 97.6 F (36.4 C) (02/25 0740) Pulse Rate:  [94-117] 96 (02/25 0645) Resp:  [21-31] 30 (02/25 0645) BP: (85-148)/(47-73) 110/59 mmHg (02/25 0744) SpO2:  [76 %-100  %] 97 % (02/25 0645) Arterial Line BP: (90-163)/(51-93) 113/63 mmHg (02/25 0700) FiO2 (%):  [40 %-80 %] 60 % (02/25 0745) Weight:  [263 lb 10.7 oz (119.6 kg)] 263 lb 10.7 oz (119.6 kg) (02/25 0418)   HEMODYNAMICS: CVP:  [0 mmHg-26 mmHg] 26 mmHg   VENTILATOR SETTINGS: Vent Mode:  [-] PRVC FiO2 (%):  [40 %-80 %] 60 % Set Rate:  [30 bmp] 30 bmp Vt Set:  [370 mL-430 mL] 370 mL PEEP:  [5 cmH20-10 cmH20] 8 cmH20 Plateau Pressure:  [22 VQQ59-56 cmH20] 28 cmH20   INTAKE / OUTPUT:  Intake/Output Summary (Last 24 hours) at 09/20/14 0801 Last data filed at 09/20/14 0600  Gross per 24 hour  Intake 4562.4 ml  Output   1100 ml  Net 3462.4 ml   PHYSICAL EXAMINATION: General: Obese female, restless Neuro: Sedated on vent. Rass -4 HEENT: [place]/AT. PERRL, sclerae anicteric. Cardiovascular: RRR, no M/R/G.  Lungs: coarse distant Abdomen: Obese, BS x 4, soft, NT/ND.  Musculoskeletal: No gross deformities, no edema.  Skin: erythema on ant right shin now with bulla  LABS:  CBC  Recent Labs Lab 09/18/14 0500 09/19/14 0327 09/20/14 0330 09/20/14 0610  WBC 7.0 13.3* 13.5*  --   HGB 9.0* 8.3* 7.4*  --   HCT 26.9* 24.8* 22.0*  --   PLT 100* 45* 18* 21*   Coag's  Recent Labs Lab 09/17/14 2150 09/20/14 0610  APTT  --  37  INR 1.63* 1.30   BMET  Recent Labs Lab 09/19/14 0745 09/19/14 1600 09/20/14 0013  NA 135 134* 134*  K 3.8 4.1 3.5  CL 103 102 103  CO2 19 22 19   BUN 74* 78* 82*  CREATININE 3.67* 3.52* 3.34*  GLUCOSE 140* 173* 217*   Electrolytes  Recent Labs Lab 09/17/14 2150  09/19/14 0745 09/19/14 1600 09/20/14 0013  CALCIUM 6.8*  < > 7.1* 7.2* 7.3*  MG 1.9  --   --   --   --   PHOS 4.4  --   --   --   --   < > = values in this interval not displayed. Sepsis Markers  Recent Labs Lab 09/17/14 2200  09/18/14 1300 09/18/14 1804 09/20/14 0330  LATICACIDVEN 4.8*  < > 4.4* 3.8* 1.7  PROCALCITON 64.94  --   --   --   --   < > = values in this interval not  displayed. ABG  Recent Labs Lab 09/18/14 2229 09/19/14 1048 09/20/14 0446  PHART 7.287* 7.214* 7.209*  PCO2ART 40.3 49.6* 50.4*  PO2ART 62.4* 63.0* 76.9*   Liver Enzymes  Recent Labs Lab 09/17/14 2150 09/18/14 0500 09/19/14 0327  AST 123* 113* 62*  ALT 86* 83* 61*  ALKPHOS 24* 29* 36*  BILITOT 2.5* 2.4* 2.3*  ALBUMIN 2.0* 2.1* 1.8*   Cardiac Enzymes  Recent Labs Lab 09/18/14 0730 09/19/14 0745  TROPONINI 0.10* 0.09*   Glucose  Recent Labs Lab 09/18/14 0742 09/19/14 1613 09/19/14 1928 09/19/14 2340 09/20/14 0426 09/20/14 0702  GLUCAP 159* 147* 161* 207* 209* 188*    Imaging Ct Tibia Fibula Right Wo Contrast  09/19/2014   CLINICAL DATA:  Osteomyelitis. Necrotizing fasciitis. New onset blisters in the RIGHT lower extremity.  EXAM: CT OF THE RIGHT TIBIA FIBULA WITHOUT CONTRAST  TECHNIQUE: Multidetector CT imaging was performed according to the standard protocol. Multiplanar CT image reconstructions were also generated.  COMPARISON:  Radiographs 09/18/2014.  FINDINGS: There is no gas in the soft tissues of the RIGHT lower extremity. Pretibial edema is present which is nonspecific. Ankle tendons appear within normal limits. Insertional calcaneal enthesopathy is incidentally noted. There is no soft tissue abscess. The tibia and fibula are normal, without osteomyelitis. Skin thickening is present anteriorly in the pretibial area act, compatible with a areas of blistering.  IMPRESSION: Negative for osteomyelitis or necrotizing fasciitis. Superficial edema and blistering in the anterior RIGHT leg.   Electronically Signed   By: Dereck Ligas M.D.   On: 09/19/2014 13:53   Dg Chest Port 1 View  09/19/2014   CLINICAL DATA:  Hypoxia  EXAM: PORTABLE CHEST - 1 VIEW  COMPARISON:  September 18, 2014  FINDINGS: Endotracheal tube tip is 4.2 cm above the carina. Nasogastric tube tip and side port are below the diaphragm. Central catheter tip is in the superior vena cava just beyond the  junction with the left innominate vein, stable. No pneumothorax. There remains moderate interstitial edema, more on the left than on the right. There is a patchy area of consolidation right upper lobe. Heart is mildly enlarged with pulmonary vascularity within normal limits.  IMPRESSION: Interstitial edema. There is a focal area of airspace consolidation in the right upper lobe which may represent developing pneumonia. No change in cardiac silhouette. Suspect a degree of congestive heart failure. There may be superimposed ARDS. Tube and catheter positions are as described without pneumothorax.   Electronically Signed   By: Lowella Grip III M.D.   On: 09/19/2014 08:11   ASSESSMENT / PLAN:  PULMONARY OETT 2/22 >>> A: Acute hypoxic respiratory failure COPD without evidence of exacerbation ARDS P:   Full  vent support. CVP: last 26 Keep plat less 30, 6 cc/kg, pressors controlled, some permissive hypecapnia DuoNebs / Albuterol- limit as able with ards pcxr in am  Even balance goals - pending cxr consider starting lasix Increase RR to 35, decrease I time to 0.7, increase PEEP to 10 and FiO2 to 50% with f/u ABG.  CARDIOVASCULAR CVL R fem 2/22 >>>2/23 CVL L IJ 2/23 >> Aline L rad 2/23 >> A:  Shock - Septic +/-  Hypovolemic Hx HTN R/o rel AI (got roids before level) - treat Sinus tachy P:  Continue Levophed: weaned to 6.5 from 40  Goal MAP > 65. Lactatic acidosis - resolved = 1.7 (2/25) Continued vaso Empiric steroids started 2/23, maintain Hold outpatient atorvastatin. CVP 25.  RENAL A:   Mixed metabolic acidosis - due to lactate + renal failure Acute renal failure; Baseline Cr unknown Rhabdo - improving with IVF resuscitation Hyponatremia Hypokalemia - repleted at [facility] - corrects to 7.7 P:   CK: 1482 (2/22) Cr plateau 3.3 2/25; following BMP q 8hrs No hydro on CT  If pressors demand continues to improve will start diureses in AM [facility] IVF. UA,  urine na, osm: Granular cast; FENa 0.36 %  GASTROINTESTINAL A:  Transaminitis - Improving GERD P:   Following LFT's   Pantoprazole. Tube feeds started 2/24 + SSI See ID  HEMATOLOGIC A:   Anemia VTE prophylaxis  thrombocytopenia worsening plts 18 (2/25) P:  Transfuse per usual ICU guidelines. SCD's; holding subq Heparin 2/24 - dc CBC in AM. If to OR transfuse. Platelet 18, no evidence of bleeding.  Will not transfuse unless hemorrhage is noted.  INFECTIOUS A:   Septic Shock Source: group A strep just back, r./o nec fasc rt leg P:   BCx2 2/22 >>> UC 2/22 >>> Levofloxacin 2/23>>>2/24  Abx: Vanc, start date 2/22>>> Day 4 / x Abx: Zosyn, start date 2/22>>> Day 4 / x Abx: Clinda 2/24 >> Day 2 / x PCT = 64.9 IVIG 2/24 >>   Ortho consult - No indication for debridement at this time, CT does not show gas or abscess  ENDOCRINE A:   R/o rel AI R/o sick euthryoid P:   SSI while on tube feeds TSH low: 0.258,  T3 Low (37); free T4 low (0.74)  Cortisol 58;  Solu-Cortef 50 q6hrs continued  NEUROLOGIC A:   Acute metabolic encephalopathy Hx Depression, Anxiety, Pain P:   Sedation:  Fentanyl gtt / Versed PRN. RASS goal: -4 Severe dyschrony, int vec NO WUA UDS: Positive for opiods Hold outpatient gabapentin, meclizine, sumatriptan, amitriptyline, duloxetine, MS Contin, percocet, opana ER, sertraline.  FAMILY  - Updates: son updated  - Inter-disciplinary family meet or Palliative Care meeting due by:  2/28.  Olam Idler, MD 09/20/2014, 8:01 AM PGY-2, [facility]  Vent adjusted.  ABG to follow.  Pressor demand improving.  Will attempt diureses in AM if BP improves since CVP is so elevated.  Platelet 18, if patient bleeds will consider transfusion.  In the meantime, continue ARDS protocol.  Will f/u ABG.  The patient is critically ill with multiple organ systems failure and requires high complexity decision making for assessment and support,  frequent evaluation and titration of therapies, application of advanced monitoring technologies and extensive interpretation of multiple databases.   Critical Care Time devoted to patient care services described in this note is  35  Minutes. This time reflects time of care of this signee Dr Jennet Maduro. This critical care time  does not reflect procedure time, or teaching time or supervisory time of PA/NP/Med student/Med Resident etc but could involve care discussion time.  Rush Farmer, M.D. [facility] Pulmonary/Critical Care Medicine. Pager: [phone]. After hours pager: [phone].

## 2014-09-20 NOTE — Progress Notes (Signed)
CRITICAL VALUE ALERT  Critical value received: Platelets 18  Date of notification:  09/20/14  Time of notification:  0502  Critical value read back:Yes.    Nurse who received alert: [place]   MD notified (1st page): Dr. Alva Garnet   Time of first page: 0515  Responding MD: Dr. Alva Garnet   Time MD responded: [phone]

## 2014-09-20 NOTE — Progress Notes (Signed)
     Subjective:  Patient is intubated and sedated.  Right leg is edematous with multiple blisters.  Nursing reports one of the blisters opening during bathing today.  It was dressed and wrapped.     Objective:   VITALS:   Filed Vitals:   09/20/14 1345 09/20/14 1400 09/20/14 1500 09/20/14 1552  BP: 170/79 138/73 89/56   Pulse: 93 92 85 89  Temp:      TempSrc:      Resp: 35 35 35 35  Height:      Weight:      SpO2: 92% 91% 96% 96%    Patient is intubated and sedated. The patient is edematous on all extremities, especially the right leg.  Multiple blisters are present on the right leg with one that has opened and draining serous fluid.  The skin is compressible without signs of necrosis.  Rest of exam is limited due to sedation.  Lab Results  Component Value Date   WBC 13.5* 09/20/2014   HGB 7.4* 09/20/2014   HCT 22.0* 09/20/2014   MCV 78.0 09/20/2014   PLT 21* 09/20/2014   BMET    Component Value Date/Time   NA 133* 09/20/2014 0905   K 3.5 09/20/2014 0905   CL 106 09/20/2014 0905   CO2 21 09/20/2014 0905   GLUCOSE 224* 09/20/2014 0905   BUN 89* 09/20/2014 0905   CREATININE 3.29* 09/20/2014 0905   CALCIUM 7.4* 09/20/2014 0905   GFRNONAA 14* 09/20/2014 0905   GFRAA 17* 09/20/2014 0905     Assessment/Plan:     Principal Problem:   Acute respiratory failure with hypoxia Active Problems:   Shock   Acute renal failure   Obesity hypoventilation syndrome   Elevated LFTs   Encounter for central line placement   Cellulitis   Endotracheally intubated  Will continue to monitor. Abx should be continued.  Still no indication for surgery at this time.   Sabirin Baray Lelan Pons 09/20/2014, 4:01 PM

## 2014-09-20 NOTE — Progress Notes (Addendum)
ANTIBIOTIC CONSULT NOTE - FOLLOW UP  Pharmacy Consult for Vancomycin, Zosyn Indication: septic shock (GAS in blood from [place])  No Known Allergies  Patient Measurements: Height: 5\' 7"  (170.2 cm) Weight: 263 lb 10.7 oz (119.6 kg) IBW/kg (Calculated) : 61.6 Vital Signs: Temp: 97.8 F (36.6 C) (02/25 1134) Temp Source: Oral (02/25 1134) BP: 138/73 mmHg (02/25 1400) Pulse Rate: 92 (02/25 1400) Intake/Output from previous day: 02/24 0701 - 02/25 0700 In: 4875 [I.V.:3923.3; NG/GT:601.7; IV Piggyback:350] Out: 1200 [Urine:1050; Emesis/NG output:150] Intake/Output from this shift: Total I/O In: 905 [I.V.:525; NG/GT:280; IV Piggyback:100] Out: 75 [Urine:75]  Labs:  Recent Labs  09/18/14 0500 09/18/14 1130  09/19/14 0327  09/19/14 1600 09/20/14 0013 09/20/14 0330 09/20/14 0610 09/20/14 0905  WBC 7.0  --   --  13.3*  --   --   --  13.5*  --   --   HGB 9.0*  --   --  8.3*  --   --   --  7.4*  --   --   PLT 100*  --   --  45*  --   --   --  18* 21*  --   LABCREA  --  98.62  --   --   --   --   --   --   --   --   CREATININE 3.76*  --   < >  --   < > 3.52* 3.34*  --   --  3.29*  < > = values in this interval not displayed. Estimated Creatinine Clearance: 24.6 mL/min (by C-G formula based on Cr of 3.29).  Recent Labs  09/20/14 1158  VANCORANDOM 9.5     Microbiology: Recent Results (from the past 720 hour(s))  MRSA PCR Screening     Status: None   Collection Time: 09/17/14  9:25 PM  Result Value Ref Range Status   MRSA by PCR NEGATIVE NEGATIVE Final    Comment:        The GeneXpert MRSA Assay (FDA approved for NASAL specimens only), is one component of a comprehensive MRSA colonization surveillance program. It is not intended to diagnose MRSA infection nor to guide or monitor treatment for MRSA infections.   Culture, Urine     Status: None   Collection Time: 09/18/14  8:26 AM  Result Value Ref Range Status   Specimen Description URINE, CATHETERIZED  Final   Special Requests Normal  Final   Colony Count NO GROWTH Performed at [facility]   Final   Culture NO GROWTH Performed at [facility]   Final   Report Status 09/19/2014 FINAL  Final  Blood culture (routine x 2)     Status: None (Preliminary result)   Collection Time: 09/18/14  9:05 AM  Result Value Ref Range Status   Specimen Description BLOOD LEFT HAND  Final   Special Requests BOTTLES DRAWN AEROBIC ONLY 2CC  Final   Culture   Final           BLOOD CULTURE RECEIVED NO GROWTH TO DATE CULTURE WILL BE HELD FOR 5 DAYS BEFORE ISSUING A FINAL NEGATIVE REPORT Performed at [facility]    Report Status PENDING  Incomplete  Respiratory virus panel (routine influenza)     Status: None   Collection Time: 09/18/14  9:12 AM  Result Value Ref Range Status   Source - RVPAN NOSE  Corrected   Respiratory Syncytial Virus A Negative Negative Final   Respiratory Syncytial Virus B  Negative Negative Final   Influenza A Negative Negative Final   Influenza B Negative Negative Final   Parainfluenza 1 Negative Negative Final   Parainfluenza 2 Negative Negative Final   Parainfluenza 3 Negative Negative Final   Metapneumovirus Negative Negative Final   Rhinovirus Negative Negative Final   Adenovirus Negative Negative Final    Comment: (NOTE) Performed At: [facility] [address] [place], [place] [id number] Lindon Romp MD WN:[id number]   Blood culture (routine x 2)     Status: None (Preliminary result)   Collection Time: 09/18/14 10:30 AM  Result Value Ref Range Status   Specimen Description BLOOD LEFT CENTRAL LINE  Final   Special Requests BOTTLES DRAWN AEROBIC AND ANAEROBIC 10CC  Final   Culture   Final           BLOOD CULTURE RECEIVED NO GROWTH TO DATE CULTURE WILL BE HELD FOR 5 DAYS BEFORE ISSUING A FINAL NEGATIVE REPORT Performed at [facility]    Report Status PENDING  Incomplete  Culture, respiratory (NON-Expectorated)     Status: None    Collection Time: 09/18/14 11:34 AM  Result Value Ref Range Status   Specimen Description TRACHEAL ASPIRATE  Final   Special Requests NONE  Final   Gram Stain   Final    ABUNDANT WBC PRESENT,BOTH PMN AND MONONUCLEAR RARE SQUAMOUS EPITHELIAL CELLS PRESENT RARE YEAST Performed at [facility]    Culture   Final    FEW YEAST CONSISTENT WITH CANDIDA SPECIES RARE GROUP A STREP (S.PYOGENES) ISOLATED Performed at [facility]    Report Status 09/20/2014 FINAL  Final    Anti-infectives    Start     Dose/Rate Route Frequency Ordered Stop   09/20/14 1400  piperacillin-tazobactam (ZOSYN) IVPB 3.375 g     3.375 g 12.5 mL/hr over 240 Minutes Intravenous 3 times per day 09/20/14 0744     09/19/14 0945  clindamycin (CLEOCIN) IVPB 900 mg     900 mg 100 mL/hr over 30 Minutes Intravenous 3 times per day 09/19/14 0932     09/19/14 0300  levofloxacin (LEVAQUIN) IVPB 750 mg  Status:  Discontinued     750 mg 100 mL/hr over 90 Minutes Intravenous Every 48 hours 09/18/14 0919 09/19/14 0936   09/18/14 2000  oseltamivir (TAMIFLU) 6 MG/ML suspension 30 mg  Status:  Discontinued     30 mg Oral Daily 09/18/14 1847 09/19/14 0936   09/18/14 1845  oseltamivir (TAMIFLU) capsule 30 mg  Status:  Discontinued     30 mg Oral Daily 09/18/14 1841 09/18/14 1846   09/18/14 1415  piperacillin-tazobactam (ZOSYN) IVPB 2.25 g  Status:  Discontinued     2.25 g 100 mL/hr over 30 Minutes Intravenous 4 times per day 09/18/14 1406 09/20/14 0744   09/17/14 2200  piperacillin-tazobactam (ZOSYN) IVPB 3.375 g  Status:  Discontinued     3.375 g 12.5 mL/hr over 240 Minutes Intravenous 3 times per day 09/17/14 2143 09/18/14 1406   09/17/14 2145  vancomycin (VANCOCIN) 2,000 mg in sodium chloride 0.9 % 500 mL IVPB     2,000 mg 250 mL/hr over 120 Minutes Intravenous  Once 09/17/14 2143 09/18/14 0200      Assessment: 59 year old female with septic shock transferred from [place] blood culture  growing GAS in 1/2 (no sensitivities to be performed). Tm 100.1. WBC up to 13.5. LA down. PCT 65. 2/23 Leg Xray- mild soft tissue swelling, no bony abnormality. Ortho -No debridement at  this time. IVIG 1gm/kg given 2/24 and 2/25  Random Vancomycin 9.5 mcg/ml (~60 hrs post loading dose)  2/22 Vanc >> 2/22 Zosyn >> 2/22 Levaquin >>2/24 2/23 Tamiflu >>2/24 2/24 Clindamycin >>  2/23 MRSA PCR neg 2/23 Flu neg 2/23 Urine >>neg 2/23 TA >> few candida albicans 2/23 Blood >> ngtd [place] blood >> 1/2 GAS (do not conduct sensitivities)  Goal of Therapy:  Vancomycin trough level 15-20 mcg/ml   Plan:  Vancomycin 1500mg  IV q48h Change Zosyn back to 3.375g IV every 8 hours - extended interval dosing Will f/u micro data, pt's clinical condition, and renal function VT at [facility] Consider narrow abx to Nafcillin or Ancef  Sherlon Handing, PharmD, BCPS Clinical pharmacist, pager [phone]  09/20/2014,2:49 PM

## 2014-09-20 NOTE — Progress Notes (Signed)
Pt desaturated to 40s, heart rate dropped to 39, BP dropped to systolics in the 61U. Pt placed on 100% 02 and levophed turned up to 15 mcg/hr. Notified Elink and will continue to monitor.

## 2014-09-20 NOTE — Progress Notes (Signed)
NUTRITION FOLLOW-UP  INTERVENTION:   After completion of current Oxepa bottle, change tube feeding to Vital High Protein at goal rate of 70 ml/h (1680 ml per day) to provide 1680 kcals (70% of estimated needs), 147 gm protein, 1404 ml free water daily.  NUTRITION DIAGNOSIS: Inadequate oral intake related to inability to eat as evidenced by NPO status; ongoing.  Goal: Enteral nutrition to provide 60-70% of estimated calorie needs (22-25 kcals/kg ideal body weight) and 100% of estimated protein needs, based on ASPEN guidelines for hypocaloric, high protein feeding in critically ill obese individuals; ongoing.  Monitor:  TF tolerance/adequacy, weight trend, labs, vent status.  ASSESSMENT: Patient was transferred from [facility] to [facility] on 2/22 with respiratory distress, acute renal failure, AG metabolic acidosis, and shock.  Pt currently receiving Oxepa at 40 ml past 12 hours. Based on current needs, will switch to Vital high Protein. Labs- high BUN, creatinine, glucose, CBGs; low Ca Patient is currently intubated on ventilator support MV: 10.9 L/min Temp (24hrs), Avg:98.5 F (36.9 C), Min:97.2 F (36.2 C), Max:100.1 F (37.8 C)  Propofol: none  Height: Ht Readings from Last 1 Encounters:  09/18/14 5\' 7"  (1.702 m)   Weight: Wt Readings from Last 1 Encounters:  09/20/14 263 lb 10.7 oz (119.6 kg)   Admission weight: 245 lb 2.4 oz (111.2 kg)  BMI:  Body mass index is 41.29 kg/(m^2). obesity class III  Estimated Nutritional Needs: Kcal: 2404 Protein: 140 gm Fluid: >/= 2 L  Skin: no issues  Diet Order: Diet NPO time specified  Intake/Output Summary (Last 24 hours) at 09/20/14 0858 Last data filed at 09/20/14 0800  Gross per 24 hour  Intake 4712.4 ml  Output   1100 ml  Net 3612.4 ml    Last BM: PTA   Labs:   Recent Labs Lab 09/17/14 2150  09/19/14 0745 09/19/14 1600 09/20/14 0013  NA 134*  < > 135 134* 134*  K 4.4  < > 3.8 4.1 3.5  CL 103  < > 103  102 103  CO2 19  < > 19 22 19   BUN 50*  < > 74* 78* 82*  CREATININE 3.63*  < > 3.67* 3.52* 3.34*  CALCIUM 6.8*  < > 7.1* 7.2* 7.3*  MG 1.9  --   --   --   --   PHOS 4.4  --   --   --   --   GLUCOSE 198*  < > 140* 173* 217*  < > = values in this interval not displayed.  CBG (last 3)   Recent Labs  09/19/14 2340 09/20/14 0426 09/20/14 0702  GLUCAP 207* 209* 188*    Scheduled Meds: . antiseptic oral rinse  7 mL Mouth Rinse QID  . chlorhexidine  15 mL Mouth Rinse BID  . clindamycin (CLEOCIN) IV  900 mg Intravenous 3 times per day  . feeding supplement (OXEPA)  1,000 mL Per Tube Q24H  . fentaNYL  50 mcg Intravenous Once  . hydrocortisone sod succinate (SOLU-CORTEF) inj  50 mg Intravenous Q6H  . insulin aspart  0-15 Units Subcutaneous 6 times per day  . ipratropium-albuterol  3 mL Nebulization Q6H  . pantoprazole (PROTONIX) IV  40 mg Intravenous QHS  . piperacillin-tazobactam (ZOSYN)  IV  3.375 g Intravenous 3 times per day    Continuous Infusions: . sodium chloride 50 mL/hr at 09/20/14 0806  . fentaNYL infusion INTRAVENOUS 350 mcg/hr (09/20/14 0806)  . midazolam (VERSED) infusion 6 mg/hr (09/20/14 0835)  .  norepinephrine (LEVOPHED) Adult infusion 4 mcg/min (09/20/14 0828)  . vasopressin (PITRESSIN) infusion - *FOR SHOCK* 0.03 Units/min (09/19/14 2357)    Wynona Dove, MS Dietetic Intern Pager: [phone]

## 2014-09-20 NOTE — Progress Notes (Signed)
eLink Physician-Brief Progress Note Patient Name: Jamie Fields DOB: October 31, 1955 MRN: [id number]   Date of Service  09/20/2014  HPI/Events of Note  Severe and worsening thrombocytopenia  eICU Interventions  DIC panel ordered Rounding MD to consider whether this could be medication-related (?vanc)     Intervention Category Intermediate Interventions: Diagnostic test evaluation  Merton Border 09/20/2014, 5:55 AM

## 2014-09-21 ENCOUNTER — Inpatient Hospital Stay (HOSPITAL_COMMUNITY): Payer: Commercial Managed Care - HMO

## 2014-09-21 LAB — GLUCOSE, CAPILLARY
GLUCOSE-CAPILLARY: 191 mg/dL — AB (ref 70–99)
GLUCOSE-CAPILLARY: 149 mg/dL — AB (ref 70–99)
GLUCOSE-CAPILLARY: 139 mg/dL — AB (ref 70–99)
Glucose-Capillary: 160 mg/dL — ABNORMAL HIGH (ref 70–99)
Glucose-Capillary: 164 mg/dL — ABNORMAL HIGH (ref 70–99)
Glucose-Capillary: 245 mg/dL — ABNORMAL HIGH (ref 70–99)

## 2014-09-21 LAB — BLOOD GAS, ARTERIAL
ACID-BASE DEFICIT: 5.5 mmol/L — AB (ref 0.0–2.0)
Bicarbonate: 20.6 mEq/L (ref 20.0–24.0)
Drawn by: 36496
FIO2: 0.8 %
LHR: 35 {breaths}/min
MECHVT: 430 mL
O2 Saturation: 94 %
PCO2 ART: 47.5 mmHg — AB (ref 35.0–45.0)
PEEP/CPAP: 12 cmH2O
PH ART: 7.256 — AB (ref 7.350–7.450)
Patient temperature: 97.6
TCO2: 22.1 mmol/L (ref 0–100)
pO2, Arterial: 78.3 mmHg — ABNORMAL LOW (ref 80.0–100.0)

## 2014-09-21 LAB — CBC
HCT: 21.8 % — ABNORMAL LOW (ref 36.0–46.0)
HCT: 23.4 % — ABNORMAL LOW (ref 36.0–46.0)
HEMOGLOBIN: 7.8 g/dL — AB (ref 12.0–15.0)
Hemoglobin: 7.3 g/dL — ABNORMAL LOW (ref 12.0–15.0)
MCH: 26.5 pg (ref 26.0–34.0)
MCH: 26.4 pg (ref 26.0–34.0)
MCHC: 33.5 g/dL (ref 30.0–36.0)
MCHC: 33.3 g/dL (ref 30.0–36.0)
MCV: 79.3 fL (ref 78.0–100.0)
MCV: 79.1 fL (ref 78.0–100.0)
Platelets: 29 10*3/uL — CL (ref 150–400)
Platelets: 53 10*3/uL — ABNORMAL LOW (ref 150–400)
RBC: 2.75 MIL/uL — ABNORMAL LOW (ref 3.87–5.11)
RBC: 2.96 MIL/uL — AB (ref 3.87–5.11)
RDW: 16.4 % — AB (ref 11.5–15.5)
RDW: 16.5 % — ABNORMAL HIGH (ref 11.5–15.5)
WBC: 14.3 10*3/uL — ABNORMAL HIGH (ref 4.0–10.5)
WBC: 33.6 10*3/uL — AB (ref 4.0–10.5)

## 2014-09-21 LAB — COMPREHENSIVE METABOLIC PANEL
ALT: 219 U/L — ABNORMAL HIGH (ref 0–35)
AST: 228 U/L — ABNORMAL HIGH (ref 0–37)
Albumin: 1.2 g/dL — ABNORMAL LOW (ref 3.5–5.2)
Alkaline Phosphatase: 101 U/L (ref 39–117)
Anion gap: 9 (ref 5–15)
BUN: 107 mg/dL — AB (ref 6–23)
CHLORIDE: 106 mmol/L (ref 96–112)
CO2: 20 mmol/L (ref 19–32)
CREATININE: 3.52 mg/dL — AB (ref 0.50–1.10)
Calcium: 9.7 mg/dL (ref 8.4–10.5)
GFR, EST AFRICAN AMERICAN: 15 mL/min — AB (ref >90)
GFR, EST NON AFRICAN AMERICAN: 13 mL/min — AB (ref >90)
GLUCOSE: 347 mg/dL — AB (ref 70–99)
Potassium: 4.4 mmol/L (ref 3.5–5.1)
Sodium: 135 mmol/L (ref 135–145)
Total Bilirubin: 2.1 mg/dL — ABNORMAL HIGH (ref 0.3–1.2)
Total Protein: 6.2 g/dL (ref 6.0–8.3)

## 2014-09-21 LAB — POCT I-STAT 3, ART BLOOD GAS (G3+)
ACID-BASE DEFICIT: 8 mmol/L — AB (ref 0.0–2.0)
Acid-base deficit: 8 mmol/L — ABNORMAL HIGH (ref 0.0–2.0)
BICARBONATE: 18.7 meq/L — AB (ref 20.0–24.0)
Bicarbonate: 22.7 mEq/L (ref 20.0–24.0)
O2 Saturation: 95 %
O2 Saturation: 69 %
PCO2 ART: 44.9 mmHg (ref 35.0–45.0)
PCO2 ART: 80.5 mmHg — AB (ref 35.0–45.0)
PH ART: 7.227 — AB (ref 7.350–7.450)
PO2 ART: 88 mmHg (ref 80.0–100.0)
PO2 ART: 51 mmHg — AB (ref 80.0–100.0)
Patient temperature: 37
Patient temperature: 97.3
TCO2: 20 mmol/L (ref 0–100)
TCO2: 25 mmol/L (ref 0–100)
pH, Arterial: 7.053 — CL (ref 7.350–7.450)

## 2014-09-21 LAB — TROPONIN I: Troponin I: 0.28 ng/mL — ABNORMAL HIGH (ref <0.031)

## 2014-09-21 LAB — BASIC METABOLIC PANEL
Anion gap: 7 (ref 5–15)
BUN: 99 mg/dL — ABNORMAL HIGH (ref 6–23)
CHLORIDE: 105 mmol/L (ref 96–112)
CO2: 20 mmol/L (ref 19–32)
CREATININE: 3.27 mg/dL — AB (ref 0.50–1.10)
Calcium: 7.5 mg/dL — ABNORMAL LOW (ref 8.4–10.5)
GFR calc non Af Amer: 14 mL/min — ABNORMAL LOW (ref >90)
GFR, EST AFRICAN AMERICAN: 17 mL/min — AB (ref >90)
Glucose, Bld: 193 mg/dL — ABNORMAL HIGH (ref 70–99)
POTASSIUM: 3.4 mmol/L — AB (ref 3.5–5.1)
Sodium: 132 mmol/L — ABNORMAL LOW (ref 135–145)

## 2014-09-21 LAB — MAGNESIUM: Magnesium: 2.3 mg/dL (ref 1.5–2.5)

## 2014-09-21 LAB — PROTIME-INR
INR: 1.39 (ref 0.00–1.49)
Prothrombin Time: 17.2 seconds — ABNORMAL HIGH (ref 11.6–15.2)

## 2014-09-21 LAB — LACTIC ACID, PLASMA
LACTIC ACID, VENOUS: 3.3 mmol/L — AB (ref 0.5–2.0)
Lactic Acid, Venous: 1.4 mmol/L (ref 0.5–2.0)

## 2014-09-21 LAB — PHOSPHORUS: PHOSPHORUS: 5.1 mg/dL — AB (ref 2.3–4.6)

## 2014-09-21 MED ORDER — POTASSIUM CHLORIDE 20 MEQ/15ML (10%) PO SOLN
40.0000 meq | Freq: Three times a day (TID) | ORAL | Status: AC
  Administered 2014-09-21 (×2): 40 meq
  Filled 2014-09-21 (×3): qty 30

## 2014-09-21 MED ORDER — DEXTROSE 5 % IV SOLN
200.0000 mg | Freq: Four times a day (QID) | INTRAVENOUS | Status: DC
  Administered 2014-09-22 – 2014-09-23 (×4): 200 mg via INTRAVENOUS
  Filled 2014-09-21 (×9): qty 20

## 2014-09-21 MED ORDER — PNEUMOCOCCAL VAC POLYVALENT 25 MCG/0.5ML IJ INJ
0.5000 mL | INJECTION | INTRAMUSCULAR | Status: AC
  Administered 2014-09-22: 0.5 mL via INTRAMUSCULAR
  Filled 2014-09-21: qty 0.5

## 2014-09-21 MED ORDER — SODIUM BICARBONATE 8.4 % IV SOLN
50.0000 meq | Freq: Once | INTRAVENOUS | Status: AC
  Administered 2014-09-21: 50 meq via INTRAVENOUS

## 2014-09-21 MED ORDER — VITAL HIGH PROTEIN PO LIQD
1000.0000 mL | ORAL | Status: DC
  Administered 2014-09-21 – 2014-10-02 (×10): 1000 mL
  Filled 2014-09-21 (×22): qty 1000

## 2014-09-21 MED ORDER — INFLUENZA VAC SPLIT QUAD 0.5 ML IM SUSY
0.5000 mL | PREFILLED_SYRINGE | INTRAMUSCULAR | Status: AC
  Administered 2014-09-22: 0.5 mL via INTRAMUSCULAR
  Filled 2014-09-21: qty 0.5

## 2014-09-21 MED ORDER — SODIUM CHLORIDE 0.9 % IV SOLN
1.5000 g | Freq: Two times a day (BID) | INTRAVENOUS | Status: DC
  Administered 2014-09-21 (×2): 1.5 g via INTRAVENOUS
  Filled 2014-09-21 (×4): qty 1.5

## 2014-09-21 MED ORDER — VECURONIUM BROMIDE 10 MG IV SOLR
INTRAVENOUS | Status: AC
  Filled 2014-09-21: qty 10

## 2014-09-21 MED ORDER — METOLAZONE 5 MG PO TABS
5.0000 mg | ORAL_TABLET | Freq: Every day | ORAL | Status: AC
  Administered 2014-09-21: 5 mg via ORAL
  Filled 2014-09-21 (×2): qty 1

## 2014-09-21 MED ORDER — FUROSEMIDE 10 MG/ML IJ SOLN
10.0000 mg/h | INTRAVENOUS | Status: DC
  Administered 2014-09-21: 10 mg/h via INTRAVENOUS
  Filled 2014-09-21 (×2): qty 25

## 2014-09-21 MED ORDER — NOREPINEPHRINE BITARTRATE 1 MG/ML IV SOLN
0.0000 ug/min | INTRAVENOUS | Status: DC
  Administered 2014-09-21: 5 ug/min via INTRAVENOUS
  Filled 2014-09-21: qty 4

## 2014-09-21 MED ORDER — CLINDAMYCIN PHOSPHATE 900 MG/50ML IV SOLN
900.0000 mg | Freq: Three times a day (TID) | INTRAVENOUS | Status: AC
  Administered 2014-09-21 (×2): 900 mg via INTRAVENOUS
  Filled 2014-09-21 (×2): qty 50

## 2014-09-21 MED FILL — Medication: Qty: 1 | Status: AC

## 2014-09-21 NOTE — Consult Note (Signed)
59 y.o. F who was taken to [facility] ED early AM hours 2/22 for respiratory distress. She was admitted and placed on BiPAP. Was also found to have acute renal failure, AG metabolic acidosis, Overnight, her acidosis and respiratory status worsened to the point she required intubation. She was also placed on levophed for persistent shock. Later in day she was transferred to [facility] for further management. She remains ventilatory dependent.  Blood cultures are positive for group a streptococcus.  She has cutaneous bullae and a localized area of cellulitis in the RLE followed by Ortho with a negative CT scan.  Creat was 3.59m/dl on admission. Creat today is 3.27 and she is positive 14 liters.  She was started on a lasix drip today.  She is oliguric. FiO2 60%.  No past medical history on file. No past surgical history on file. Social History:  has no tobacco, alcohol, and drug history on file. Allergies: No Known Allergies No family history on file.  Medications:  Prior to Admission:  Prescriptions prior to admission  Medication Sig Dispense Refill Last Dose  . albuterol (PROVENTIL) (2.5 MG/3ML) 0.083% nebulizer solution Take 2.5 mg by nebulization every 4 (four) hours as needed for wheezing or shortness of breath.    unknown  . amitriptyline (ELAVIL) 25 MG tablet Take 50 mg by mouth at bedtime.    unknown  . atorvastatin (LIPITOR) 10 MG tablet Take 10 mg by mouth at bedtime.    unknown  . diclofenac (VOLTAREN) 75 MG EC tablet Take 75 mg by mouth 2 (two) times daily as needed (pain).    unknown  . DULoxetine (CYMBALTA) 30 MG capsule Take 60 mg by mouth daily.    unknown  . esomeprazole (NEXIUM) 40 MG capsule Take 40 mg by mouth daily.    unknown  . gabapentin (NEURONTIN) 800 MG tablet Take 800 mg by mouth 3 (three) times daily.    unknown  . ibuprofen (ADVIL,MOTRIN) 200 MG tablet Take 200 mg by mouth every 4 (four) hours as needed for fever (pain).   unknown  . Isopropyl Alcohol 95 % LIQD Place 4-5  drops in ear(s) as needed (water in ears).   unknown  . meclizine (ANTIVERT) 25 MG tablet Take 25 mg by mouth daily.    unknown  . morphine (MS CONTIN) 15 MG 12 hr tablet Take 15 mg by mouth every 12 (twelve) hours.    unknown  . ondansetron (ZOFRAN) 8 MG tablet Take 8 mg by mouth 3 (three) times daily as needed for nausea or vomiting.   unknown  . OPANA ER, CRUSH RESISTANT, 10 MG T12A 12 hr tablet Take 10 mg by mouth 2 (two) times daily.    unknown  . oxyCODONE-acetaminophen (PERCOCET) 10-325 MG per tablet Take 1 tablet by mouth every 6 (six) hours as needed for pain.    unknown  . oxymetazoline (AFRIN) 0.05 % nasal spray Place 1 spray into both nostrils 2 (two) times daily.   unknown  . phenylephrine (NEO-SYNEPHRINE) 0.5 % nasal solution Place 2 drops into both nostrils every 12 (twelve) hours as needed for congestion.   unknown  . Phenylephrine-Guaifenesin 5-200 MG TABS Take 2 tablets by mouth every 4 (four) hours as needed (congestion/cough).   unknown  . sertraline (ZOLOFT) 100 MG tablet Take 200 mg by mouth daily.    unknown  . SUMAtriptan (IMITREX) 50 MG tablet Take 50 mg by mouth every 2 (two) hours as needed for migraine. Do not exceed 200 mg in 24  hours   unknown  . tamsulosin (FLOMAX) 0.4 MG CAPS capsule Take 0.4 mg by mouth daily. 30 minutes after the same meal every day   unknown  . traMADol (ULTRAM) 50 MG tablet Take 50-100 mg by mouth 2 (two) times daily. Alternate with oxycodone - do not take together   unknown   Scheduled: . ampicillin-sulbactam (UNASYN) IV  1.5 g Intravenous Q12H  . antiseptic oral rinse  7 mL Mouth Rinse QID  . chlorhexidine  15 mL Mouth Rinse BID  . clindamycin (CLEOCIN) IV  900 mg Intravenous 3 times per day  . hydrocortisone sod succinate (SOLU-CORTEF) inj  50 mg Intravenous Q6H  . [START ON 09/22/2014] Influenza vac split quadrivalent PF  0.5 mL Intramuscular Tomorrow-1000  . insulin aspart  0-15 Units Subcutaneous 6 times per day  . ipratropium-albuterol   3 mL Nebulization Q6H  . pantoprazole (PROTONIX) IV  40 mg Intravenous QHS  . [START ON 09/22/2014] pneumococcal 23 valent vaccine  0.5 mL Intramuscular Tomorrow-1000   ROS: NA  Blood pressure 95/60, pulse 95, temperature 97.3 F (36.3 C), temperature source Oral, resp. rate 35, height 5' 7" (1.702 m), weight 122.7 kg (270 lb 8.1 oz), SpO2 98 %.  General appearance: unconscience Head: ATNC Throat: OT tube Resp:Decreased vented sounds Cardio: RRR GI: obese, no masses Extremities: bandage RLE with edema and blistes/bullae on extrems Skin: as above with bulli Neurologic:unresponsive  Results for orders placed or performed during the hospital encounter of 09/17/14 (from the past 48 hour(s))  Blood culture (routine x 2)     Status: None (Preliminary result)   Collection Time: 09/19/14  5:00 PM  Result Value Ref Range   Specimen Description BLOOD RIGHT ARM    Special Requests BOTTLES DRAWN AEROBIC ONLY 10CC    Culture             BLOOD CULTURE RECEIVED NO GROWTH TO DATE CULTURE WILL BE HELD FOR 5 DAYS BEFORE ISSUING A FINAL NEGATIVE REPORT Performed at [facility]    Report Status PENDING   Blood culture (routine x 2)     Status: None (Preliminary result)   Collection Time: 09/19/14  5:20 PM  Result Value Ref Range   Specimen Description BLOOD RIGHT HAND    Special Requests BOTTLES DRAWN AEROBIC ONLY 5CC    Culture             BLOOD CULTURE RECEIVED NO GROWTH TO DATE CULTURE WILL BE HELD FOR 5 DAYS BEFORE ISSUING A FINAL NEGATIVE REPORT Performed at [facility]    Report Status PENDING   Glucose, capillary     Status: Abnormal   Collection Time: 09/19/14  7:28 PM  Result Value Ref Range   Glucose-Capillary 161 (H) 70 - 99 mg/dL  Glucose, capillary     Status: Abnormal   Collection Time: 09/19/14 11:40 PM  Result Value Ref Range   Glucose-Capillary 207 (H) 70 - 99 mg/dL   Comment 1 Notify RN   Basic metabolic panel     Status: Abnormal   Collection Time:  09/20/14 12:13 AM  Result Value Ref Range   Sodium 134 (L) 135 - 145 mmol/L   Potassium 3.5 3.5 - 5.1 mmol/L   Chloride 103 96 - 112 mmol/L   CO2 19 19 - 32 mmol/L   Glucose, Bld 217 (H) 70 - 99 mg/dL   BUN 82 (H) 6 - 23 mg/dL   Creatinine, Ser 3.34 (H) 0.50 - 1.10 mg/dL   Calcium  7.3 (L) 8.4 - 10.5 mg/dL   GFR calc non Af Amer 14 (L) >90 mL/min   GFR calc Af Amer 16 (L) >90 mL/min    Comment: (NOTE) The eGFR has been calculated using the CKD EPI equation. This calculation has not been validated in all clinical situations. eGFR's persistently <90 mL/min signify possible Chronic Kidney Disease.    Anion gap 12 5 - 15  CBC with Differential     Status: Abnormal   Collection Time: 09/20/14  3:30 AM  Result Value Ref Range   WBC 13.5 (H) 4.0 - 10.5 K/uL   RBC 2.82 (L) 3.87 - 5.11 MIL/uL   Hemoglobin 7.4 (L) 12.0 - 15.0 g/dL   HCT 22.0 (L) 36.0 - 46.0 %   MCV 78.0 78.0 - 100.0 fL   MCH 26.2 26.0 - 34.0 pg   MCHC 33.6 30.0 - 36.0 g/dL   RDW 15.8 (H) 11.5 - 15.5 %   Platelets 18 (LL) 150 - 400 K/uL    Comment: PLATELET COUNT CONFIRMED BY SMEAR SPECIMEN CHECKED FOR CLOTS DELTA CHECK NOTED CRITICAL RESULT CALLED TO, READ BACK BY AND VERIFIED WITH: KRISTINA KELLY RN _0  02.25.16 KCLARK    Neutrophils Relative % 85 (H) 43 - 77 %   Neutro Abs 11.5 (H) 1.7 - 7.7 K/uL   Lymphocytes Relative 7 (L) 12 - 46 %   Lymphs Abs 0.9 0.7 - 4.0 K/uL   Monocytes Relative 8 3 - 12 %   Monocytes Absolute 1.1 (H) 0.1 - 1.0 K/uL   Eosinophils Relative 0 0 - 5 %   Eosinophils Absolute 0.0 0.0 - 0.7 K/uL   Basophils Relative 0 0 - 1 %   Basophils Absolute 0.0 0.0 - 0.1 K/uL  Lactic acid, plasma     Status: None   Collection Time: 09/20/14  3:30 AM  Result Value Ref Range   Lactic Acid, Venous 1.7 0.5 - 2.0 mmol/L  HIV antibody     Status: None   Collection Time: 09/20/14  3:30 AM  Result Value Ref Range   HIV Screen 4th Generation wRfx Non Reactive Non Reactive    Comment: (NOTE) Performed  At: [facility] [place], [place] [id number] Lindon Romp MD ZW:[id number]   Glucose, capillary     Status: Abnormal   Collection Time: 09/20/14  4:26 AM  Result Value Ref Range   Glucose-Capillary 209 (H) 70 - 99 mg/dL   Comment 1 Notify RN   Blood gas, arterial     Status: Abnormal   Collection Time: 09/20/14  4:46 AM  Result Value Ref Range   FIO2 0.60 %   Delivery systems VENTILATOR    Mode PRESSURE REGULATED VOLUME CONTROL    VT 370 mL   Rate 30 resp/min   Peep/cpap 10.0 cm H20   pH, Arterial 7.209 (L) 7.350 - 7.450   pCO2 arterial 50.4 (H) 35.0 - 45.0 mmHg   pO2, Arterial 76.9 (L) 80.0 - 100.0 mmHg   Bicarbonate 19.4 (L) 20.0 - 24.0 mEq/L   TCO2 20.9 0 - 100 mmol/L   Acid-base deficit 7.2 (H) 0.0 - 2.0 mmol/L   O2 Saturation 92.5 %   Patient temperature 98.6    Collection site A-LINE    Drawn by [phone]    Sample type ARTERIAL DRAW    Allens test (pass/fail) PASS PASS  DIC (disseminated intravasc coag) panel     Status: Abnormal   Collection Time: 09/20/14  6:10 AM  Result  Value Ref Range   Prothrombin Time 16.3 (H) 11.6 - 15.2 seconds   INR 1.30 0.00 - 1.49   aPTT 37 24 - 37 seconds    Comment:        IF BASELINE aPTT IS ELEVATED, SUGGEST PATIENT RISK ASSESSMENT BE USED TO DETERMINE APPROPRIATE ANTICOAGULANT THERAPY.    Fibrinogen 642 (H) 204 - 475 mg/dL   D-Dimer, Quant 3.99 (H) 0.00 - 0.48 ug/mL-FEU    Comment:        AT THE INHOUSE ESTABLISHED CUTOFF VALUE OF 0.48 ug/mL FEU, THIS ASSAY HAS BEEN DOCUMENTED IN THE LITERATURE TO HAVE A SENSITIVITY AND NEGATIVE PREDICTIVE VALUE OF AT LEAST 98 TO 99%.  THE TEST RESULT SHOULD BE CORRELATED WITH AN ASSESSMENT OF THE CLINICAL PROBABILITY OF DVT / VTE.    Platelets 21 (LL) 150 - 400 K/uL    Comment: PLATELET COUNT CONFIRMED BY SMEAR CRITICAL VALUE NOTED.  VALUE IS CONSISTENT WITH PREVIOUSLY REPORTED AND CALLED VALUE.    Smear Review NO SCHISTOCYTES SEEN   Glucose, capillary      Status: Abnormal   Collection Time: 09/20/14  7:02 AM  Result Value Ref Range   Glucose-Capillary 188 (H) 70 - 99 mg/dL  Basic metabolic panel     Status: Abnormal   Collection Time: 09/20/14  9:05 AM  Result Value Ref Range   Sodium 133 (L) 135 - 145 mmol/L   Potassium 3.5 3.5 - 5.1 mmol/L   Chloride 106 96 - 112 mmol/L   CO2 21 19 - 32 mmol/L   Glucose, Bld 224 (H) 70 - 99 mg/dL   BUN 89 (H) 6 - 23 mg/dL   Creatinine, Ser 3.29 (H) 0.50 - 1.10 mg/dL   Calcium 7.4 (L) 8.4 - 10.5 mg/dL   GFR calc non Af Amer 14 (L) >90 mL/min   GFR calc Af Amer 17 (L) >90 mL/min    Comment: (NOTE) The eGFR has been calculated using the CKD EPI equation. This calculation has not been validated in all clinical situations. eGFR's persistently <90 mL/min signify possible Chronic Kidney Disease.    Anion gap 6 5 - 15  Glucose, capillary     Status: Abnormal   Collection Time: 09/20/14 11:02 AM  Result Value Ref Range   Glucose-Capillary 184 (H) 70 - 99 mg/dL  Vancomycin, random     Status: None   Collection Time: 09/20/14 11:58 AM  Result Value Ref Range   Vancomycin Rm 9.5 ug/mL    Comment:        Random Vancomycin therapeutic range is dependent on dosage and time of specimen collection. A peak range is 20.0-40.0 ug/mL A trough range is 5.0-15.0 ug/mL          Glucose, capillary     Status: Abnormal   Collection Time: 09/20/14  4:04 PM  Result Value Ref Range   Glucose-Capillary 212 (H) 70 - 99 mg/dL  I-STAT 3, arterial blood gas (G3+)     Status: Abnormal   Collection Time: 09/20/14  4:25 PM  Result Value Ref Range   pH, Arterial 7.135 (LL) 7.350 - 7.450   pCO2 arterial 57.1 (HH) 35.0 - 45.0 mmHg   pO2, Arterial 69.0 (L) 80.0 - 100.0 mmHg   Bicarbonate 19.4 (L) 20.0 - 24.0 mEq/L   TCO2 21 0 - 100 mmol/L   O2 Saturation 88.0 %   Acid-base deficit 10.0 (H) 0.0 - 2.0 mmol/L   Patient temperature 97.3 F    Collection site RADIAL, ALLEN'S  TEST ACCEPTABLE    Drawn by VENIPUNCTURE     Sample type ARTERIAL    Comment NOTIFIED PHYSICIAN   Glucose, capillary     Status: Abnormal   Collection Time: 09/20/14  8:05 PM  Result Value Ref Range   Glucose-Capillary 210 (H) 70 - 99 mg/dL  Glucose, capillary     Status: Abnormal   Collection Time: 09/20/14 11:56 PM  Result Value Ref Range   Glucose-Capillary 245 (H) 70 - 99 mg/dL   Comment 1 Notify RN   Blood gas, arterial     Status: Abnormal (Preliminary result)   Collection Time: 09/21/14  3:30 AM  Result Value Ref Range   FIO2 0.60 %   Delivery systems VENTILATOR    Mode PRESSURE REGULATED VOLUME CONTROL    VT 370 mL   Rate 35 resp/min   Peep/cpap 10.0 cm H20   pH, Arterial 7.176 (LL) 7.350 - 7.450    Comment: CRITICAL RESULT CALLED TO, READ BACK BY AND VERIFIED WITH: KIM CLIFTON RRT AT 0340 BY KASEY JOYCE RRT ON 09/21/2014    pCO2 arterial 51.7 (H) 35.0 - 45.0 mmHg   pO2, Arterial 73.4 (L) 80.0 - 100.0 mmHg   Bicarbonate 18.4 (L) 20.0 - 24.0 mEq/L   TCO2 20.0 0 - 100 mmol/L   Acid-base deficit 8.6 (H) 0.0 - 2.0 mmol/L   O2 Saturation 92.4 %   Patient temperature 98.6    Collection site ARTERIAL LINE    Drawn by [phone]    Sample type ARTERIAL    Allens test (pass/fail) PENDING PASS  Glucose, capillary     Status: Abnormal   Collection Time: 09/21/14  4:10 AM  Result Value Ref Range   Glucose-Capillary 191 (H) 70 - 99 mg/dL   Comment 1 Notify RN   CBC     Status: Abnormal   Collection Time: 09/21/14  5:05 AM  Result Value Ref Range   WBC 14.3 (H) 4.0 - 10.5 K/uL   RBC 2.75 (L) 3.87 - 5.11 MIL/uL   Hemoglobin 7.3 (L) 12.0 - 15.0 g/dL   HCT 21.8 (L) 36.0 - 46.0 %   MCV 79.3 78.0 - 100.0 fL   MCH 26.5 26.0 - 34.0 pg   MCHC 33.5 30.0 - 36.0 g/dL   RDW 16.4 (H) 11.5 - 15.5 %   Platelets 29 (LL) 150 - 400 K/uL    Comment: PLATELET COUNT CONFIRMED BY SMEAR SPECIMEN CHECKED FOR CLOTS DELTA CHECK NOTED CRITICAL RESULT CALLED TO, READ BACK BY AND VERIFIED WITH: MINDY HARPER RN_0  02.26.16 KCLARK   Basic  metabolic panel     Status: Abnormal   Collection Time: 09/21/14  5:05 AM  Result Value Ref Range   Sodium 132 (L) 135 - 145 mmol/L   Potassium 3.4 (L) 3.5 - 5.1 mmol/L   Chloride 105 96 - 112 mmol/L   CO2 20 19 - 32 mmol/L   Glucose, Bld 193 (H) 70 - 99 mg/dL   BUN 99 (H) 6 - 23 mg/dL   Creatinine, Ser 3.27 (H) 0.50 - 1.10 mg/dL   Calcium 7.5 (L) 8.4 - 10.5 mg/dL   GFR calc non Af Amer 14 (L) >90 mL/min   GFR calc Af Amer 17 (L) >90 mL/min    Comment: (NOTE) The eGFR has been calculated using the CKD EPI equation. This calculation has not been validated in all clinical situations. eGFR's persistently <90 mL/min signify possible Chronic Kidney Disease.    Anion gap 7 5 - 15  Magnesium     Status: None   Collection Time: 09/21/14  5:05 AM  Result Value Ref Range   Magnesium 2.3 1.5 - 2.5 mg/dL  Phosphorus     Status: Abnormal   Collection Time: 09/21/14  5:05 AM  Result Value Ref Range   Phosphorus 5.1 (H) 2.3 - 4.6 mg/dL  Glucose, capillary     Status: Abnormal   Collection Time: 09/21/14  8:52 AM  Result Value Ref Range   Glucose-Capillary 164 (H) 70 - 99 mg/dL  I-STAT 3, arterial blood gas (G3+)     Status: Abnormal   Collection Time: 09/21/14 10:37 AM  Result Value Ref Range   pH, Arterial 7.227 (L) 7.350 - 7.450   pCO2 arterial 44.9 35.0 - 45.0 mmHg   pO2, Arterial 88.0 80.0 - 100.0 mmHg   Bicarbonate 18.7 (L) 20.0 - 24.0 mEq/L   TCO2 20 0 - 100 mmol/L   O2 Saturation 95.0 %   Acid-base deficit 8.0 (H) 0.0 - 2.0 mmol/L   Patient temperature 37.0 C    Collection site ARTERIAL LINE    Drawn by Operator    Sample type ARTERIAL   Glucose, capillary     Status: Abnormal   Collection Time: 09/21/14 12:23 PM  Result Value Ref Range   Glucose-Capillary 160 (H) 70 - 99 mg/dL  Glucose, capillary     Status: Abnormal   Collection Time: 09/21/14  4:01 PM  Result Value Ref Range   Glucose-Capillary 149 (H) 70 - 99 mg/dL   Dg Chest Port 1 View  09/21/2014   CLINICAL  DATA:  Hypoxia  EXAM: PORTABLE CHEST - 1 VIEW  COMPARISON:  September 20, 2014  FINDINGS: Endotracheal tube tip is 4.5 cm above the carina. Central catheter tip is in the superior cava slightly beyond the junction with the left innominate vein. Nasogastric tube tip and side port are in the stomach. No pneumothorax. There is slightly less alveolar consolidation on the left compared to 1 day prior. There is mild atelectasis in the lung right upper lobe. Right lung is otherwise clear. Heart is mildly enlarged with pulmonary vascularity within normal limits.  IMPRESSION: Tube and catheter positions as described without pneumothorax. There is patchy consolidation throughout the left lung, less pronounced compared to 1 day prior. Mild atelectasis right upper lobe. No new opacity. No change in cardiac silhouette.   Electronically Signed   By: Lowella Grip III M.D.   On: 09/21/2014 07:11   Dg Chest Port 1 View  09/20/2014   CLINICAL DATA:  Acute respiratory failure with hypoxia. Shock. Acute renal failure.  EXAM: PORTABLE CHEST - 1 VIEW  COMPARISON:  09/19/2014  FINDINGS: Support lines and tubes in appropriate position. No pneumothorax identified.  Worsening airspace disease is seen throughout the majority of the left lung and in the right upper lobe. Low lung volumes again noted. Heart size remains stable.  IMPRESSION: Worsening asymmetric airspace disease throughout left lung and right upper lobe.   Electronically Signed   By: Earle Gell M.D.   On: 09/20/2014 09:38    Assessment:  1 Renal Failure 2 Group A strep sepsis 3 SIRS 4 Volume Overload Plan: 1 If she fails high dose diuretics we should begin CRRT.  Discussed with Dr. Nelda Marseille.  I spoke to pt's son.  Tavian Callander C 09/21/2014, 4:22 PM

## 2014-09-21 NOTE — Progress Notes (Signed)
PULMONARY / CRITICAL CARE MEDICINE   Name: Jamie Fields MRN: [id number] DOB: 14-Jul-1956    ADMISSION DATE:  09/17/2014 CONSULTATION DATE:  09/17/2014   REFERRING MD :  [facility]  CHIEF COMPLAINT:  Respiratory Distress  INITIAL PRESENTATION: 59 y.o. F who was taken to [facility] ED early AM hours 2/22 for respiratory distress.  She was admitted and placed on BiPAP.  Was also found to have acute renal failure, AG metabolic acidosis, Overnight, her acidosis and respiratory status worsened to the point she required intubation.  She was also placed on levophed for persistent shock.  Later in day was transferred to [facility] for further management.  STUDIES:  CT Chest 2/22 >>> no acute process CT abd / pelv 2/22 >>> iliac lymph nodes likely reactive.  No acute process CXR 2/23  >> Persistent mild vascular congestion and mild interstitial edema bilaterally. Superimposed infiltrate right upper lobe and left base cannot be excluded. Echo 0/62 >> Systolic function was normal. EF 60% to 65%. Right Tib/Fib DG >> No evidence of osteo; Mild soft tissue swelling RUQ [place] 2/23>> Liver echogenicity is diffusely increased and somewhat heterogeneous. These findings most likely are indicative of hepatic steatosis CT tib/fib Rt >> Negative for osteomyelitis or necrotizing fasciitis. Superficial edema and blistering  SIGNIFICANT EVENTS: 2/22 - admitted at [place]; respiratory failure-intubated; shock; transferred to [facility] 2/23 - [place]; intubated; Fever, CXR infiltrate = sepsis 2/24- group A strep just called from [place]- add Clinda; CT neg for nec fas; Tube feeds started 2/25 >>  SUBJECTIVE: Intubated and sedated. Does open eyes or follow commands. Afeb in last 24hrs. Levo off  VITAL SIGNS: Temp:  [95.9 F (35.5 C)-97.8 F (36.6 C)] 97.4 F (36.3 C) (02/26 0408) Pulse Rate:  [84-107] 97 (02/26 0600) Resp:  [28-35] 35 (02/26 0600) BP: (71-170)/(45-79) 100/61 mmHg (02/26 0600) SpO2:  [87 %-100  %] 97 % (02/26 0600) Arterial Line BP: (67-182)/(43-83) 112/63 mmHg (02/26 0645) FiO2 (%):  [40 %-60 %] 60 % (02/26 0341) Weight:  [270 lb 8.1 oz (122.7 kg)] 270 lb 8.1 oz (122.7 kg) (02/26 0435)   HEMODYNAMICS:     VENTILATOR SETTINGS: Vent Mode:  [-] PRVC FiO2 (%):  [40 %-60 %] 60 % Set Rate:  [35 bmp] 35 bmp Vt Set:  [370 mL] 370 mL PEEP:  [8 cmH20-10 cmH20] 10 cmH20 Plateau Pressure:  [25 cmH20-30 cmH20] 28 cmH20   INTAKE / OUTPUT:  Intake/Output Summary (Last 24 hours) at 09/21/14 6948 Last data filed at 09/21/14 0800  Gross per 24 hour  Intake 3959.53 ml  Output    510 ml  Net 3449.53 ml   PHYSICAL EXAMINATION: General: Obese female, restless Neuro: Sedated on vent. Rass -4 HEENT: [place]/AT. PERRL, sclerae anicteric. Cardiovascular: RRR, no M/R/G.  Lungs: coarse distant Abdomen: Obese, BS x 4, soft, NT/ND.  Musculoskeletal: No gross deformities, no edema.  Skin: erythema on ant right shin now with bulla  LABS:  CBC  Recent Labs Lab 09/19/14 0327 09/20/14 0330 09/20/14 0610 09/21/14 0505  WBC 13.3* 13.5*  --  14.3*  HGB 8.3* 7.4*  --  7.3*  HCT 24.8* 22.0*  --  21.8*  PLT 45* 18* 21* 29*   Coag's  Recent Labs Lab 09/17/14 2150 09/20/14 0610  APTT  --  37  INR 1.63* 1.30   BMET  Recent Labs Lab 09/20/14 0013 09/20/14 0905 09/21/14 0505  NA 134* 133* 132*  K 3.5 3.5 3.4*  CL 103 106 105  CO2 19 21 20   BUN 82* 89* 99*  CREATININE 3.34* 3.29* 3.27*  GLUCOSE 217* 224* 193*   Electrolytes  Recent Labs Lab 09/17/14 2150  09/20/14 0013 09/20/14 0905 09/21/14 0505  CALCIUM 6.8*  < > 7.3* 7.4* 7.5*  MG 1.9  --   --   --  2.3  PHOS 4.4  --   --   --  5.1*  < > = values in this interval not displayed. Sepsis Markers  Recent Labs Lab 09/17/14 2200  09/18/14 1300 09/18/14 1804 09/20/14 0330  LATICACIDVEN 4.8*  < > 4.4* 3.8* 1.7  PROCALCITON 64.94  --   --   --   --   < > = values in this interval not displayed. ABG  Recent  Labs Lab 09/20/14 0446 09/20/14 1625 09/21/14 0330  PHART 7.209* 7.135* 7.176*  PCO2ART 50.4* 57.1* 51.7*  PO2ART 76.9* 69.0* 73.4*   Liver Enzymes  Recent Labs Lab 09/17/14 2150 09/18/14 0500 09/19/14 0327  AST 123* 113* 62*  ALT 86* 83* 61*  ALKPHOS 24* 29* 36*  BILITOT 2.5* 2.4* 2.3*  ALBUMIN 2.0* 2.1* 1.8*   Cardiac Enzymes  Recent Labs Lab 09/18/14 0730 09/19/14 0745  TROPONINI 0.10* 0.09*   Glucose  Recent Labs Lab 09/20/14 0702 09/20/14 1102 09/20/14 1604 09/20/14 2005 09/20/14 2356 09/21/14 0410  GLUCAP 188* 184* 212* 210* 245* 191*    Imaging Dg Chest Port 1 View  09/20/2014   CLINICAL DATA:  Acute respiratory failure with hypoxia. Shock. Acute renal failure.  EXAM: PORTABLE CHEST - 1 VIEW  COMPARISON:  09/19/2014  FINDINGS: Support lines and tubes in appropriate position. No pneumothorax identified.  Worsening airspace disease is seen throughout the majority of the left lung and in the right upper lobe. Low lung volumes again noted. Heart size remains stable.  IMPRESSION: Worsening asymmetric airspace disease throughout left lung and right upper lobe.   Electronically Signed   By: Earle Gell M.D.   On: 09/20/2014 09:38   ASSESSMENT / PLAN:  PULMONARY OETT 2/22 >>> A: Acute hypoxic respiratory failure COPD without evidence of exacerbation ARDS P:   Full vent support.  Increase Tv to 1 cc/kg above current. CVP 24. Keep plat less 30, increase Tv to 7 cc/kg, some permissive hypecapnia DuoNebs / Albuterol- limit as able with ards pCXR in am  Active diureses as below.  CARDIOVASCULAR CVL R fem 2/22 >>>2/23 CVL L IJ 2/23 >> Aline L rad 2/23 >> A:  Shock - Septic +/-  Hypovolemic Hx HTN R/o rel AI (got roids before level) - treat Sinus tachy P:  Levophed: weaned off D/C vasopressin  Goal MAP > 65. Lactatic acidosis - resolved = 1.7 (2/25) Empiric steroids started 2/23, maintain Hold outpatient atorvastatin. CVP 24. Diureses as  below.  RENAL A:   Mixed metabolic acidosis - due to lactate + renal failure Acute renal failure; Baseline Cr unknown Rhabdo - improving with IVF resuscitation Hyponatremia Hypokalemia - repleted at [facility] - corrects to 7.7 P:   CK: 1482 (2/22) Cr plateau 3.27 2/26; following BMP  No hydro on CT  Lasix 10 mg/hr IV drip x24 hours. Zaroxolyn 5 mg PO x1. KVO IVF. UA, urine na, osm: Granular cast; FENa 0.36 % Renal consult called.  GASTROINTESTINAL A:  Transaminitis - Improving GERD P:   Following LFT's   Pantoprazole. Tube feeds started 2/24 + SSI See ID  HEMATOLOGIC A:   Anemia VTE prophylaxis  thrombocytopenia worsening plts 18 (2/25) P:  Transfuse per usual ICU guidelines. SCD's; holding subq Heparin 2/24 - dc Platelet 29, no evidence of bleeding.  Will not transfuse unless hemorrhage is noted.If to OR transfuse. CBC in AM.  INFECTIOUS A:   Septic Shock Source: group A strep just back, r./o nec fasc rt leg P:   BCx2 2/22 >>> NGTD UC 2/22 >>> RVP >> Neg Resp sputum >>> RARE GROUP A STREP (S.PYOGENES) ISOLATED Levofloxacin 2/23>>>2/24 Abx: Vanc, start date 2/22>>>2/26 Abx: Zosyn, start date 2/22>>>2/26 Abx: Clinda 2/24 >> Day 3/3 Unasyn 2/26>>> PCT = 64.9 IVIG 2/24 >> 2/25  Ortho consult - No indication for debridement at this time, CT does not show gas or abscess  ENDOCRINE A:   R/o rel AI R/o sick euthryoid P:   SSI while on tube feeds TSH low: 0.258,  T3 Low (37); free T4 low (0.74)  Cortisol 58;  Solu-Cortef 50 q6hrs continued  NEUROLOGIC A:   Acute metabolic encephalopathy Hx Depression, Anxiety, Pain P:   Sedation:  Fentanyl gtt / Versed PRN. RASS goal: -4 Severe dyschrony, int vec NO WUA UDS: Positive for opiods Hold outpatient gabapentin, meclizine, sumatriptan, amitriptyline, duloxetine, MS Contin, percocet, opana ER, sertraline.  FAMILY  - Updates: son updated  - Inter-disciplinary family meet or  Palliative Care meeting due by:  2/28.  Olam Idler, MD 09/21/2014, 8:23 AM PGY-2, [place]  Will start active diureses today, replace electrolytes, increase Tv to 7 cc/kg.  Repeat ABG at 11 AM.  Hope to diurese enough to decrease PEEP and FiO2 to the point when we can start weaning.  KVO all IVF.  Monitor.  The patient is critically ill with multiple organ systems failure and requires high complexity decision making for assessment and support, frequent evaluation and titration of therapies, application of advanced monitoring technologies and extensive interpretation of multiple databases.   Critical Care Time devoted to patient care services described in this note is  35  Minutes. This time reflects time of care of this signee Dr Jennet Maduro. This critical care time does not reflect procedure time, or teaching time or supervisory time of PA/NP/Med student/Med Resident etc but could involve care discussion time.  Rush Farmer, M.D. [facility] Pulmonary/Critical Care Medicine. Pager: [phone]. After hours pager: [phone].

## 2014-09-21 NOTE — Progress Notes (Signed)
CRITICAL VALUE ALERT  Critical value received:  Lactic acid 3.3  Date of notification: 09/21/14  Time of notification:  1812  Critical value read back: yes  Nurse who received alert:  Rockne Menghini  MD notified Dr. Halford Chessman  Time of first page:  [phone]

## 2014-09-21 NOTE — Progress Notes (Signed)
UR Completed.  [phone]  

## 2014-09-21 NOTE — Progress Notes (Signed)
Inpatient Diabetes Program Recommendations  AACE/ADA: New Consensus Statement on Inpatient Glycemic Control (2013)  Target Ranges:  Prepandial:   less than 140 mg/dL      Peak postprandial:   less than 180 mg/dL (1-2 hours)      Critically ill patients:  140 - 180 mg/dL   Results for HARBOR, PASTER (MRN [id number]) as of 09/21/2014 12:45  Ref. Range 09/20/2014 07:02 09/20/2014 11:02 09/20/2014 16:04 09/20/2014 20:05 09/20/2014 23:56 09/21/2014 04:10 09/21/2014 08:52 09/21/2014 12:23  Glucose-Capillary Latest Range: 70-99 mg/dL 188 (H) 184 (H) 212 (H) 210 (H) 245 (H) 191 (H) 164 (H) 160 (H)    Diabetes history: No Outpatient Diabetes medications: NA Current orders for Inpatient glycemic control: Novolog 0-15 units Q4H  Inpatient Diabetes Program Recommendations Tube Feeding Coverage: Please consider ordering Novolog 3 units Q4H for tube feeding coverage (in addition to correction insulin).  Thanks, Barnie Alderman, RN, MSN, CCRN, CDE Diabetes Coordinator Inpatient Diabetes Program [phone] (Team Pager) [phone] (AP office) [phone] [facility] office)

## 2014-09-21 NOTE — Procedures (Signed)
Code Blue Note  Called urgently to bedside after pt experienced bradycardia and then PEA. CPR had been started already on my arrival. She has a hx of obesity, COPD, was admitted with GPC bacteremia, renal failure and mixed acidosis requiring MV. She received rounds of epi, bicarb x 2, calcium, then d50 + insulin 10u. Pulse and BP were achieved after 4 minutes.  She received bag-mask ventilation with SpO2 in the 70's. PEEP valve 12 cmH2O added without much improvement.  ABG and labs sent, CXR pending.    Recent Labs Lab 09/20/14 0446 09/20/14 1625 09/21/14 0330 09/21/14 1037 09/21/14 1655  PHART 7.209* 7.135* 7.176* 7.227* 7.053*  PCO2ART 50.4* 57.1* 51.7* 44.9 80.5*  PO2ART 76.9* 69.0* 73.4* 88.0 51.0*  HCO3 19.4* 19.4* 18.4* 18.7* 22.7  TCO2 20.9 21 20.0 20 25  O2SAT 92.5 88.0 92.4 95.0 69.0    Recent Labs Lab 09/17/14 2150  09/19/14 0745 09/19/14 1600 09/20/14 0013 09/20/14 0905 09/21/14 0505  NA 134*  < > 135 134* 134* 133* 132*  K 4.4  < > 3.8 4.1 3.5 3.5 3.4*  CL 103  < > 103 102 103 106 105  CO2 19  < > 19 22 19 21 20   GLUCOSE 198*  < > 140* 173* 217* 224* 193*  BUN 50*  < > 74* 78* 82* 89* 99*  CREATININE 3.63*  < > 3.67* 3.52* 3.34* 3.29* 3.27*  CALCIUM 6.8*  < > 7.1* 7.2* 7.3* 7.4* 7.5*  MG 1.9  --   --   --   --   --  2.3  PHOS 4.4  --   --   --   --   --  5.1*  < > = values in this interval not displayed.  Of note she this is not the first time she has become acutely unstable - RN's report that she has had hypoxic events associated with bradycardia 2/25, but never to point of PEA. She has been evaluated and considered for HD but there has not been an urgent indication to start. Depending on BMP results will consider HD this pm +/- a bicarb gtt. Based on ABG would try to increase her Ve as able.    Baltazar Apo, MD, PhD 09/21/2014, 5:17 PM [place] [facility] [phone] or if no answer [phone]

## 2014-09-21 NOTE — Progress Notes (Signed)
CRITICAL VALUE ALERT  Critical value received:  Platelets-29  Date of notification:  09/21/2014  Time of notification:  6:32 AM  Critical value read back:Yes.    Nurse who received alert:  Dillard Essex  MD notified (1st page):  Dr. Alva Garnet  Time of first page:  6:32 AM  MD notified (2nd page):  Time of second page:  Responding MD:  Dr. Alva Garnet  Time MD responded:  6:32 AM

## 2014-09-21 NOTE — Progress Notes (Signed)
Pt's HR 30s; no pulse palpated or dopplerable; See vital signs and Code Blue called; See code blue sheet.  Lenor Coffin, RN

## 2014-09-21 NOTE — Progress Notes (Signed)
ANTIBIOTIC CONSULT NOTE - INITIAL  Pharmacy Consult for Unasyn Indication: GAS bacteremia, pna  No Known Allergies  Patient Measurements: Height: 5\' 7"  (170.2 cm) Weight: 270 lb 8.1 oz (122.7 kg) IBW/kg (Calculated) : 61.6  Vital Signs: Temp: 98.6 F (37 C) (02/26 1225) Temp Source: Oral (02/26 1225) BP: 90/53 mmHg (02/26 1220) Pulse Rate: 93 (02/26 1220) Intake/Output from previous day: 02/25 0701 - 02/26 0700 In: 4190.7 [I.V.:2330.7; NG/GT:1050; IV Piggyback:800] Out: 550 [Urine:550] Intake/Output from this shift: Total I/O In: 489 [I.V.:189; NG/GT:250; IV Piggyback:50] Out: 75 [Urine:75]  Labs:  Recent Labs  09/19/14 0327  09/20/14 0013 09/20/14 0330 09/20/14 0610 09/20/14 0905 09/21/14 0505  WBC 13.3*  --   --  13.5*  --   --  14.3*  HGB 8.3*  --   --  7.4*  --   --  7.3*  PLT 45*  --   --  18* 21*  --  29*  CREATININE  --   < > 3.34*  --   --  3.29* 3.27*  < > = values in this interval not displayed. Estimated Creatinine Clearance: 25.1 mL/min (by C-G formula based on Cr of 3.27).  Recent Labs  09/20/14 1158  VANCORANDOM 9.5     Microbiology: Recent Results (from the past 720 hour(s))  MRSA PCR Screening     Status: None   Collection Time: 09/17/14  9:25 PM  Result Value Ref Range Status   MRSA by PCR NEGATIVE NEGATIVE Final    Comment:        The GeneXpert MRSA Assay (FDA approved for NASAL specimens only), is one component of a comprehensive MRSA colonization surveillance program. It is not intended to diagnose MRSA infection nor to guide or monitor treatment for MRSA infections.   Culture, Urine     Status: None   Collection Time: 09/18/14  8:26 AM  Result Value Ref Range Status   Specimen Description URINE, CATHETERIZED  Final   Special Requests Normal  Final   Colony Count NO GROWTH Performed at [facility]   Final   Culture NO GROWTH Performed at [facility]   Final   Report Status 09/19/2014 FINAL  Final   Blood culture (routine x 2)     Status: None (Preliminary result)   Collection Time: 09/18/14  9:05 AM  Result Value Ref Range Status   Specimen Description BLOOD LEFT HAND  Final   Special Requests BOTTLES DRAWN AEROBIC ONLY 2CC  Final   Culture   Final           BLOOD CULTURE RECEIVED NO GROWTH TO DATE CULTURE WILL BE HELD FOR 5 DAYS BEFORE ISSUING A FINAL NEGATIVE REPORT Performed at [facility]    Report Status PENDING  Incomplete  Respiratory virus panel (routine influenza)     Status: None   Collection Time: 09/18/14  9:12 AM  Result Value Ref Range Status   Source - RVPAN NOSE  Corrected   Respiratory Syncytial Virus A Negative Negative Final   Respiratory Syncytial Virus B Negative Negative Final   Influenza A Negative Negative Final   Influenza B Negative Negative Final   Parainfluenza 1 Negative Negative Final   Parainfluenza 2 Negative Negative Final   Parainfluenza 3 Negative Negative Final   Metapneumovirus Negative Negative Final   Rhinovirus Negative Negative Final   Adenovirus Negative Negative Final    Comment: (NOTE) Performed At: [facility] [address] [place], [place] [id number] Lindon Romp  MD VQ:[id number]   Blood culture (routine x 2)     Status: None (Preliminary result)   Collection Time: 09/18/14 10:30 AM  Result Value Ref Range Status   Specimen Description BLOOD LEFT CENTRAL LINE  Final   Special Requests BOTTLES DRAWN AEROBIC AND ANAEROBIC 10CC  Final   Culture   Final           BLOOD CULTURE RECEIVED NO GROWTH TO DATE CULTURE WILL BE HELD FOR 5 DAYS BEFORE ISSUING A FINAL NEGATIVE REPORT Performed at [facility]    Report Status PENDING  Incomplete  Culture, respiratory (NON-Expectorated)     Status: None   Collection Time: 09/18/14 11:34 AM  Result Value Ref Range Status   Specimen Description TRACHEAL ASPIRATE  Final   Special Requests NONE  Final   Gram Stain   Final    ABUNDANT WBC PRESENT,BOTH PMN AND  MONONUCLEAR RARE SQUAMOUS EPITHELIAL CELLS PRESENT RARE YEAST Performed at [facility]    Culture   Final    FEW YEAST CONSISTENT WITH CANDIDA SPECIES RARE GROUP A STREP (S.PYOGENES) ISOLATED Performed at [facility]    Report Status 09/20/2014 FINAL  Final  Blood culture (routine x 2)     Status: None (Preliminary result)   Collection Time: 09/19/14  5:00 PM  Result Value Ref Range Status   Specimen Description BLOOD RIGHT ARM  Final   Special Requests BOTTLES DRAWN AEROBIC ONLY 10CC  Final   Culture   Final           BLOOD CULTURE RECEIVED NO GROWTH TO DATE CULTURE WILL BE HELD FOR 5 DAYS BEFORE ISSUING A FINAL NEGATIVE REPORT Performed at [facility]    Report Status PENDING  Incomplete  Blood culture (routine x 2)     Status: None (Preliminary result)   Collection Time: 09/19/14  5:20 PM  Result Value Ref Range Status   Specimen Description BLOOD RIGHT HAND  Final   Special Requests BOTTLES DRAWN AEROBIC ONLY 5CC  Final   Culture   Final           BLOOD CULTURE RECEIVED NO GROWTH TO DATE CULTURE WILL BE HELD FOR 5 DAYS BEFORE ISSUING A FINAL NEGATIVE REPORT Performed at [facility]    Report Status PENDING  Incomplete    Assessment: 59 y.o. female on Vancomycin, Zosyn (Day #5), and Clindamycin (Day #3) for GAS in blood at [facility] and GAS in trach asp here also. Afeb. WBC up slightly to 14.3. LA down. PCT 65. No debridement at this time per ortho. Received IVIG 1gm/kg on 2/24 and 2/25. To narrow abx to Unasyn today.  Pt with SCr 3.27 (peak 3.64), est CrCl 20-25 ml/min. UOP down to 0.2 ml/kg/hr.  2/22 Vanc >>2/26 2/22 Zosyn >>2/26 2/22 Levaquin >>2/24 2/23 Tamiflu >>2/24 2/24 Clindamycin >>2/26 Unasyn 2/26>>  2/23 MRSA PCR neg 2/23 Flu neg 2/23 Urine >>neg 2/23 TA >> few candida albicans; rare GAS 2/23 Blood >> ngtd Johnson blood >> 1/2 GAS (do not conduct sensirivities)  Goal of Therapy:  Resolution of infection  Plan:   Unasyn 1.5gm IV q12h Clinda 900 mg IV q8h for toxin inhibition (ends today) F/u renal function, micro data, pt's clinical condition  Sherlon Handing, PharmD, BCPS Clinical pharmacist, pager [phone] 09/21/2014,1:32 PM

## 2014-09-22 ENCOUNTER — Inpatient Hospital Stay (HOSPITAL_COMMUNITY): Payer: Commercial Managed Care - HMO

## 2014-09-22 DIAGNOSIS — I469 Cardiac arrest, cause unspecified: Secondary | ICD-10-CM | POA: Diagnosis not present

## 2014-09-22 DIAGNOSIS — J9601 Acute respiratory failure with hypoxia: Secondary | ICD-10-CM | POA: Diagnosis not present

## 2014-09-22 DIAGNOSIS — R6521 Severe sepsis with septic shock: Secondary | ICD-10-CM | POA: Diagnosis not present

## 2014-09-22 DIAGNOSIS — A409 Streptococcal sepsis, unspecified: Secondary | ICD-10-CM | POA: Diagnosis not present

## 2014-09-22 DIAGNOSIS — R532 Functional quadriplegia: Secondary | ICD-10-CM | POA: Diagnosis not present

## 2014-09-22 DIAGNOSIS — E43 Unspecified severe protein-calorie malnutrition: Secondary | ICD-10-CM | POA: Diagnosis not present

## 2014-09-22 DIAGNOSIS — J154 Pneumonia due to other streptococci: Secondary | ICD-10-CM | POA: Diagnosis not present

## 2014-09-22 DIAGNOSIS — G9341 Metabolic encephalopathy: Secondary | ICD-10-CM | POA: Diagnosis not present

## 2014-09-22 DIAGNOSIS — L03119 Cellulitis of unspecified part of limb: Secondary | ICD-10-CM

## 2014-09-22 DIAGNOSIS — N170 Acute kidney failure with tubular necrosis: Secondary | ICD-10-CM | POA: Diagnosis not present

## 2014-09-22 LAB — RENAL FUNCTION PANEL
ALBUMIN: 1.4 g/dL — AB (ref 3.5–5.2)
ANION GAP: 12 (ref 5–15)
BUN: 132 mg/dL — AB (ref 6–23)
CHLORIDE: 105 mmol/L (ref 96–112)
CO2: 24 mmol/L (ref 19–32)
CREATININE: 3.49 mg/dL — AB (ref 0.50–1.10)
Calcium: 8.3 mg/dL — ABNORMAL LOW (ref 8.4–10.5)
GFR, EST AFRICAN AMERICAN: 15 mL/min — AB (ref >90)
GFR, EST NON AFRICAN AMERICAN: 13 mL/min — AB (ref >90)
Glucose, Bld: 167 mg/dL — ABNORMAL HIGH (ref 70–99)
Phosphorus: 5.5 mg/dL — ABNORMAL HIGH (ref 2.3–4.6)
Potassium: 3.1 mmol/L — ABNORMAL LOW (ref 3.5–5.1)
SODIUM: 141 mmol/L (ref 135–145)

## 2014-09-22 LAB — BASIC METABOLIC PANEL
Anion gap: 8 (ref 5–15)
BUN: 125 mg/dL — AB (ref 6–23)
CO2: 25 mmol/L (ref 19–32)
CREATININE: 3.6 mg/dL — AB (ref 0.50–1.10)
Calcium: 8.2 mg/dL — ABNORMAL LOW (ref 8.4–10.5)
Chloride: 105 mmol/L (ref 96–112)
GFR calc Af Amer: 15 mL/min — ABNORMAL LOW (ref >90)
GFR calc non Af Amer: 13 mL/min — ABNORMAL LOW (ref >90)
Glucose, Bld: 167 mg/dL — ABNORMAL HIGH (ref 70–99)
POTASSIUM: 3.2 mmol/L — AB (ref 3.5–5.1)
SODIUM: 138 mmol/L (ref 135–145)

## 2014-09-22 LAB — CBC
HCT: 21.3 % — ABNORMAL LOW (ref 36.0–46.0)
Hemoglobin: 7.2 g/dL — ABNORMAL LOW (ref 12.0–15.0)
MCH: 26.3 pg (ref 26.0–34.0)
MCHC: 33.8 g/dL (ref 30.0–36.0)
MCV: 77.7 fL — ABNORMAL LOW (ref 78.0–100.0)
Platelets: 60 K/uL — ABNORMAL LOW (ref 150–400)
RBC: 2.74 MIL/uL — ABNORMAL LOW (ref 3.87–5.11)
RDW: 16.5 % — ABNORMAL HIGH (ref 11.5–15.5)
WBC: 28.7 K/uL — ABNORMAL HIGH (ref 4.0–10.5)

## 2014-09-22 LAB — PHOSPHORUS: Phosphorus: 4.9 mg/dL — ABNORMAL HIGH (ref 2.3–4.6)

## 2014-09-22 LAB — GLUCOSE, CAPILLARY
GLUCOSE-CAPILLARY: 146 mg/dL — AB (ref 70–99)
Glucose-Capillary: 124 mg/dL — ABNORMAL HIGH (ref 70–99)
Glucose-Capillary: 138 mg/dL — ABNORMAL HIGH (ref 70–99)
Glucose-Capillary: 140 mg/dL — ABNORMAL HIGH (ref 70–99)
Glucose-Capillary: 144 mg/dL — ABNORMAL HIGH (ref 70–99)
Glucose-Capillary: 156 mg/dL — ABNORMAL HIGH (ref 70–99)

## 2014-09-22 LAB — MAGNESIUM: Magnesium: 2.3 mg/dL (ref 1.5–2.5)

## 2014-09-22 MED ORDER — PRISMASOL BGK 4/2.5 32-4-2.5 MEQ/L IV SOLN
INTRAVENOUS | Status: DC
  Administered 2014-09-22: 16:00:00 via INTRAVENOUS_CENTRAL
  Filled 2014-09-22 (×3): qty 5000

## 2014-09-22 MED ORDER — HEPARIN SODIUM (PORCINE) 1000 UNIT/ML DIALYSIS
1000.0000 [IU] | INTRAMUSCULAR | Status: DC | PRN
  Filled 2014-09-22: qty 3
  Filled 2014-09-22: qty 6

## 2014-09-22 MED ORDER — POTASSIUM CHLORIDE 20 MEQ PO PACK: 40.0000 meq | PACK | Freq: Once | ORAL | Status: DC

## 2014-09-22 MED ORDER — SODIUM CHLORIDE 0.9 % IV SOLN
3.0000 g | Freq: Three times a day (TID) | INTRAVENOUS | Status: DC
  Administered 2014-09-22 – 2014-09-24 (×6): 3 g via INTRAVENOUS
  Filled 2014-09-22 (×8): qty 3

## 2014-09-22 MED ORDER — POTASSIUM CHLORIDE CRYS ER 20 MEQ PO TBCR: 40.0000 meq | EXTENDED_RELEASE_TABLET | Freq: Once | ORAL | Status: DC

## 2014-09-22 MED ORDER — POTASSIUM CHLORIDE 20 MEQ/15ML (10%) PO SOLN
40.0000 meq | Freq: Once | ORAL | Status: AC
  Administered 2014-09-22: 40 meq via ORAL
  Filled 2014-09-22 (×2): qty 30

## 2014-09-22 MED ORDER — PANTOPRAZOLE SODIUM 40 MG PO PACK
40.0000 mg | PACK | Freq: Every day | ORAL | Status: DC
  Administered 2014-09-22 – 2014-10-11 (×20): 40 mg
  Filled 2014-09-22 (×20): qty 20

## 2014-09-22 MED ORDER — SODIUM CHLORIDE 0.9 % FOR CRRT
INTRAVENOUS_CENTRAL | Status: DC | PRN
  Filled 2014-09-22: qty 1000

## 2014-09-22 MED ORDER — PRISMASOL BGK 4/2.5 32-4-2.5 MEQ/L IV SOLN
INTRAVENOUS | Status: DC
  Administered 2014-09-22 – 2014-09-24 (×7): via INTRAVENOUS_CENTRAL
  Filled 2014-09-22 (×13): qty 5000

## 2014-09-22 MED ORDER — POTASSIUM CHLORIDE 20 MEQ/15ML (10%) PO SOLN
40.0000 meq | Freq: Once | ORAL | Status: AC
  Administered 2014-09-22: 40 meq
  Filled 2014-09-22: qty 30

## 2014-09-22 NOTE — Procedures (Signed)
Hemodialysis Catheter Insertion Procedure Note MARYKATHLEEN RUSSI [id number] 09-Dec-1955  Procedure: Insertion of Hemodialysis Catheter Indications: Hemodialysis  Procedure Details Consent: Risks of procedure as well as the alternatives and risks of each were explained to the (patient/caregiver).  Consent for procedure obtained.   Time Out: Verified patient identification, verified procedure, site/side was marked, verified correct patient position, special equipment/implants available, medications/allergies/relevent history reviewed, required imaging and test results available.  Performed  Maximum sterile technique was used including antiseptics, cap, gloves, gown, hand hygiene, mask and sheet.  Skin prep: Chlorhexidine; local anesthetic administered A triple lumen HD catheter was placed in the right internal jugular vein using the Seldinger technique.  Evaluation Blood flow good Complications: No apparent complications Patient did tolerate procedure well. Chest X-ray ordered to verify placement.  CXR: pending.   Procedure performed under direct supervision of Dr. Halford Chessman and with ultrasound guidance for real time vessel cannulation.      Noe Gens, NP-C [place] [facility] Pgr: [phone] or [phone]   09/22/2014, 3:17 PM  I was presented for procedure.  Chesley Mires, MD [facility] Pulmonary/Critical Care 09/22/2014, 3:32 PM Pager:  [phone] After 3pm call: [phone]

## 2014-09-22 NOTE — Progress Notes (Signed)
ANTIBIOTIC CONSULT NOTE - Follow-up  Pharmacy Consult for Unasyn Indication: GAS bacteremia, pna  No Known Allergies  Patient Measurements: Height: 5\' 7"  (170.2 cm) Weight: 268 lb 11.9 oz (121.9 kg) IBW/kg (Calculated) : 61.6  Vital Signs: Temp: 97.7 F (36.5 C) (02/27 0742) Temp Source: Oral (02/27 0742) BP: 116/54 mmHg (02/27 0800) Pulse Rate: 96 (02/27 0800) Intake/Output from previous day: 02/26 0701 - 02/27 0700 In: 2121.9 [I.V.:806.9; NG/GT:1075; IV Piggyback:240] Out: 2300 [Urine:2300] Intake/Output from this shift:    Labs:  Recent Labs  09/21/14 0505 09/21/14 1659 09/22/14 0525  WBC 14.3* 33.6* 28.7*  HGB 7.3* 7.8* 7.2*  PLT 29* 53* 60*  CREATININE 3.27* 3.52* 3.60*   Estimated Creatinine Clearance: 22.8 mL/min (by C-G formula based on Cr of 3.6).  Recent Labs  09/20/14 1158  VANCORANDOM 9.5     Microbiology: Recent Results (from the past 720 hour(s))  MRSA PCR Screening     Status: None   Collection Time: 09/17/14  9:25 PM  Result Value Ref Range Status   MRSA by PCR NEGATIVE NEGATIVE Final    Comment:        The GeneXpert MRSA Assay (FDA approved for NASAL specimens only), is one component of a comprehensive MRSA colonization surveillance program. It is not intended to diagnose MRSA infection nor to guide or monitor treatment for MRSA infections.   Culture, Urine     Status: None   Collection Time: 09/18/14  8:26 AM  Result Value Ref Range Status   Specimen Description URINE, CATHETERIZED  Final   Special Requests Normal  Final   Colony Count NO GROWTH Performed at [facility]   Final   Culture NO GROWTH Performed at [facility]   Final   Report Status 09/19/2014 FINAL  Final  Blood culture (routine x 2)     Status: None (Preliminary result)   Collection Time: 09/18/14  9:05 AM  Result Value Ref Range Status   Specimen Description BLOOD LEFT HAND  Final   Special Requests BOTTLES DRAWN AEROBIC ONLY 2CC  Final    Culture   Final           BLOOD CULTURE RECEIVED NO GROWTH TO DATE CULTURE WILL BE HELD FOR 5 DAYS BEFORE ISSUING A FINAL NEGATIVE REPORT Performed at [facility]    Report Status PENDING  Incomplete  Respiratory virus panel (routine influenza)     Status: None   Collection Time: 09/18/14  9:12 AM  Result Value Ref Range Status   Source - RVPAN NOSE  Corrected   Respiratory Syncytial Virus A Negative Negative Final   Respiratory Syncytial Virus B Negative Negative Final   Influenza A Negative Negative Final   Influenza B Negative Negative Final   Parainfluenza 1 Negative Negative Final   Parainfluenza 2 Negative Negative Final   Parainfluenza 3 Negative Negative Final   Metapneumovirus Negative Negative Final   Rhinovirus Negative Negative Final   Adenovirus Negative Negative Final    Comment: (NOTE) Performed At: [facility] [place], [place] [id number] Lindon Romp MD FY:[id number]   Blood culture (routine x 2)     Status: None (Preliminary result)   Collection Time: 09/18/14 10:30 AM  Result Value Ref Range Status   Specimen Description BLOOD LEFT CENTRAL LINE  Final   Special Requests BOTTLES DRAWN AEROBIC AND ANAEROBIC 10CC  Final   Culture   Final           BLOOD CULTURE  RECEIVED NO GROWTH TO DATE CULTURE WILL BE HELD FOR 5 DAYS BEFORE ISSUING A FINAL NEGATIVE REPORT Performed at [facility]    Report Status PENDING  Incomplete  Culture, respiratory (NON-Expectorated)     Status: None   Collection Time: 09/18/14 11:34 AM  Result Value Ref Range Status   Specimen Description TRACHEAL ASPIRATE  Final   Special Requests NONE  Final   Gram Stain   Final    ABUNDANT WBC PRESENT,BOTH PMN AND MONONUCLEAR RARE SQUAMOUS EPITHELIAL CELLS PRESENT RARE YEAST Performed at [facility]    Culture   Final    FEW YEAST CONSISTENT WITH CANDIDA SPECIES RARE GROUP A STREP (S.PYOGENES) ISOLATED Performed at [facility]    Report Status 09/20/2014 FINAL  Final  Blood culture (routine x 2)     Status: None (Preliminary result)   Collection Time: 09/19/14  5:00 PM  Result Value Ref Range Status   Specimen Description BLOOD RIGHT ARM  Final   Special Requests BOTTLES DRAWN AEROBIC ONLY 10CC  Final   Culture   Final           BLOOD CULTURE RECEIVED NO GROWTH TO DATE CULTURE WILL BE HELD FOR 5 DAYS BEFORE ISSUING A FINAL NEGATIVE REPORT Performed at [facility]    Report Status PENDING  Incomplete  Blood culture (routine x 2)     Status: None (Preliminary result)   Collection Time: 09/19/14  5:20 PM  Result Value Ref Range Status   Specimen Description BLOOD RIGHT HAND  Final   Special Requests BOTTLES DRAWN AEROBIC ONLY 5CC  Final   Culture   Final           BLOOD CULTURE RECEIVED NO GROWTH TO DATE CULTURE WILL BE HELD FOR 5 DAYS BEFORE ISSUING A FINAL NEGATIVE REPORT Performed at [facility]    Report Status PENDING  Incomplete    Assessment: 59 y.o. female on Unasyn (total abx D#6) for GAS in blood at [facility] and GAS in trach asp here also. Afeb. WBC trending up to 28.7. LA down. PCT 65. No debridement at this time per ortho. Received IVIG 1gm/kg on 2/24 and 2/25.   Pt to begin CRRT today.  2/22 Vanc >>2/26 2/22 Zosyn >>2/26 2/22 Levaquin >>2/24 2/23 Tamiflu >>2/24 2/24 Clindamycin >>2/26 Unasyn 2/26>>  2/23 MRSA PCR neg 2/23 Flu neg 2/23 Urine >>neg 2/23 TA >> few candida albicans; rare GAS 2/23 Blood >> ngtd [place] blood >> 1/2 GAS (do not conduct sensirivities)  Goal of Therapy:  Resolution of infection  Plan:  Change Unasyn to 3gm IV q8h F/u CRRT tolerance, micro data, pt's clinical condition  Sherlon Handing, PharmD, BCPS Clinical pharmacist, pager [phone] 09/22/2014,8:46 AM

## 2014-09-22 NOTE — Progress Notes (Signed)
PULMONARY / CRITICAL CARE MEDICINE   Name: Jamie Fields MRN: [id number] DOB: 03/29/1956    ADMISSION DATE:  09/17/2014 CONSULTATION DATE:  09/17/2014   REFERRING MD :  [facility]  CHIEF COMPLAINT:  Respiratory Distress  INITIAL PRESENTATION: 59 y.o. F who was taken to [facility] ED early AM hours 2/22 for respiratory distress.  She was admitted and placed on BiPAP.  Was also found to have acute renal failure, AG metabolic acidosis, Overnight, her acidosis and respiratory status worsened to the point she required intubation.  She was also placed on levophed for persistent shock.  Later in day 2/22 was transferred to [facility] for further management.  STUDIES:  CT Chest 2/22 >> no acute process CT abd / pelv 2/22 >> iliac lymph nodes likely reactive.  No acute process CXR 2/23  >> Persistent mild vascular congestion and mild interstitial edema bilaterally. Superimposed infiltrate right upper lobe and left base cannot be excluded. Echo 3/22 >> Systolic function was normal. EF 60% to 65%. Right Tib/Fib DG >> No evidence of osteo; Mild soft tissue swelling RUQ [place] 2/23 >> Liver echogenicity is diffusely increased and somewhat heterogeneous. Most likely are indicative of hepatic steatosis CT tib/fib Rt >> Negative for osteomyelitis or necrotizing fasciitis. Superficial edema and blistering  SIGNIFICANT EVENTS: 2/22  Admitted at [place]; respiratory failure-intubated; shock; transferred to [facility] 2/23  On [place]; intubated; Fever, CXR infiltrate = sepsis 2/24  Group A strep just called from micro- add Clinda; CT neg for nec fas; Tube feeds started 2/26  Hypoxia, brady arrest in pm    SUBJECTIVE: RN reports no acute changes.  Remains on fentanyl/versed gtt.   VITAL SIGNS: Temp:  [97.3 F (36.3 C)-98.7 F (37.1 C)] 97.7 F (36.5 C) (02/27 0742) Pulse Rate:  [93-121] 96 (02/27 0800) Resp:  [11-36] 35 (02/27 0800) BP: (83-141)/(49-75) 116/54 mmHg (02/27 0800) SpO2:  [78 %-100 %] 100 %  (02/27 0800) Arterial Line BP: (89-176)/(51-154) 126/59 mmHg (02/27 0700) FiO2 (%):  [60 %-100 %] 70 % (02/27 0800) Weight:  [268 lb 11.9 oz (121.9 kg)] 268 lb 11.9 oz (121.9 kg) (02/27 0600)   HEMODYNAMICS: CVP:  [20 mmHg] 20 mmHg   VENTILATOR SETTINGS: Vent Mode:  [-] PRVC FiO2 (%):  [60 %-100 %] 70 % Set Rate:  [35 bmp] 35 bmp Vt Set:  [430 mL] 430 mL PEEP:  [10 cmH20-12 cmH20] 12 cmH20 Plateau Pressure:  [17 cmH20-34 cmH20] 27 cmH20   INTAKE / OUTPUT:  Intake/Output Summary (Last 24 hours) at 09/22/14 0254 Last data filed at 09/22/14 0700  Gross per 24 hour  Intake 1959.9 ml  Output   2265 ml  Net -305.1 ml   PHYSICAL EXAMINATION: General: Obese female, in NAD Neuro: Sedated on vent. Rass -4 HEENT: [place]/AT. PERRL, sclerae icteric. Cardiovascular: RRR, no M/R/G.  Lungs: even/non-labored on vent, coarse bilaterally Abdomen: Obese, BS x 4, soft, NT/ND.  Musculoskeletal: No gross deformities, no edema.  Skin: RLE dressing c/d/i   LABS:  CBC  Recent Labs Lab 09/21/14 0505 09/21/14 1659 09/22/14 0525  WBC 14.3* 33.6* 28.7*  HGB 7.3* 7.8* 7.2*  HCT 21.8* 23.4* 21.3*  PLT 29* 53* 60*   Coag's  Recent Labs Lab 09/17/14 2150 09/20/14 0610 09/21/14 1659  APTT  --  37  --   INR 1.63* 1.30 1.39   BMET  Recent Labs Lab 09/21/14 0505 09/21/14 1659 09/22/14 0525  NA 132* 135 138  K 3.4* 4.4 3.2*  CL 105 106  105  CO2 20 20 25   BUN 99* 107* 125*  CREATININE 3.27* 3.52* 3.60*  GLUCOSE 193* 347* 167*   Electrolytes  Recent Labs Lab 09/17/14 2150  09/21/14 0505 09/21/14 1659 09/22/14 0525  CALCIUM 6.8*  < > 7.5* 9.7 8.2*  MG 1.9  --  2.3  --  2.3  PHOS 4.4  --  5.1*  --  4.9*  < > = values in this interval not displayed.   Sepsis Markers  Recent Labs Lab 09/17/14 2200  09/20/14 0330 09/21/14 1659 09/21/14 1959  LATICACIDVEN 4.8*  < > 1.7 3.3* 1.4  PROCALCITON 64.94  --   --   --   --   < > = values in this interval not displayed.    ABG  Recent Labs Lab 09/21/14 1655 09/21/14 2108 09/22/14 0500  PHART 7.053* 7.256* 7.254*  PCO2ART 80.5* 47.5* 47.9*  PO2ART 51.0* 78.3* 79.2*   Liver Enzymes  Recent Labs Lab 09/18/14 0500 09/19/14 0327 09/21/14 1659  AST 113* 62* 228*  ALT 83* 61* 219*  ALKPHOS 29* 36* 101  BILITOT 2.4* 2.3* 2.1*  ALBUMIN 2.1* 1.8* 1.2*   Cardiac Enzymes  Recent Labs Lab 09/18/14 0730 09/19/14 0745 09/21/14 1659  TROPONINI 0.10* 0.09* 0.28*   Glucose  Recent Labs Lab 09/21/14 1223 09/21/14 1601 09/21/14 1945 09/21/14 2350 09/22/14 0512 09/22/14 0700  GLUCAP 160* 149* 139* 124* 146* 138*    Imaging Dg Chest Port 1 View  09/21/2014   CLINICAL DATA:  59 year old female with respiratory failure, intubated. Initial encounter.  EXAM: PORTABLE CHEST - 1 VIEW  COMPARISON:  0459 hours today and earlier.  FINDINGS: Portable AP view at 1700 hours. Stable endotracheal tube tip at the level the clavicles. Stable visible enteric tube. Stable left IJ central line. New resuscitation pads or pacing pads project over the chest.  Stable to mildly lower lung volumes. Visible mediastinal contours are stable. No pneumothorax or plural effusion is evident. Streaky perihilar opacity persists, but is regressed compared to 09/20/2014.  IMPRESSION: 1.  Stable lines and tubes. 2. Continued streaky perihilar opacity, improved compared to 09/20/2014.   Electronically Signed   By: Genevie Ann M.D.   On: 09/21/2014 17:12   Dg Chest Port 1 View  09/21/2014   CLINICAL DATA:  Hypoxia  EXAM: PORTABLE CHEST - 1 VIEW  COMPARISON:  September 20, 2014  FINDINGS: Endotracheal tube tip is 4.5 cm above the carina. Central catheter tip is in the superior cava slightly beyond the junction with the left innominate vein. Nasogastric tube tip and side port are in the stomach. No pneumothorax. There is slightly less alveolar consolidation on the left compared to 1 day prior. There is mild atelectasis in the lung right upper  lobe. Right lung is otherwise clear. Heart is mildly enlarged with pulmonary vascularity within normal limits.  IMPRESSION: Tube and catheter positions as described without pneumothorax. There is patchy consolidation throughout the left lung, less pronounced compared to 1 day prior. Mild atelectasis right upper lobe. No new opacity. No change in cardiac silhouette.   Electronically Signed   By: Lowella Grip III M.D.   On: 09/21/2014 07:11   ASSESSMENT / PLAN:  PULMONARY OETT 2/22 >>> A: Acute hypoxic respiratory failure COPD without evidence of exacerbation ARDS P:   PRVC, lung protective ventilation  Keep plat less 30, Vt @ 7 cc/kg  DuoNebs / Albuterol PRN Trend cxr / ABG Active diureses as below, hope to unload volume and then be  able to wean  CARDIOVASCULAR CVL R fem 2/22 >> 2/23 CVL L IJ 2/23 >> Aline L rad 2/23 >> A:  Shock - Septic +/-  Hypovolemic Lactic Acidosis - resolved 2/25 Hx HTN Adrenal Insufficiency - unfortunately received steroids before level, so empirically treat Sinus tachy P:  Goal MAP > 65. Empiric steroids started 2/23 Hold outpatient atorvastatin. Diureses as below.  RENAL A:   Mixed metabolic acidosis - due to lactate + renal failure Acute renal failure - Baseline Cr unknown, no hydro on CT.  UA, urine na, osm: Granular cast; FENa 0.36 % Rhabdo - improving with IVF resuscitation Hyponatremia Hypokalemia  Pseudohypocalcemia - corrects to wnl P:   Trend BMP  Lasix per Renal (200 mg Q6) KVO IVF. Renal consulted, appreciate input   GASTROINTESTINAL A:  Transaminitis - Improving GERD P:   Repeat LFT's in am  Pantoprazole. Tube feeds started 2/24 + SSI See ID  HEMATOLOGIC A:   Anemia VTE prophylaxis Thrombocytopenia - improving P:  Transfuse per usual ICU guidelines. DVT proph:  SCD's Platelet 29, no evidence of bleeding.  Will not transfuse unless hemorrhage is noted or if to OR. CBC in AM.  INFECTIOUS A:   Group A Strep  Septic Shock   P:   BCx2 2/22 >> Resp sputum >>> RARE GROUP A STREP (S.PYOGENES) ISOLATED  Levofloxacin 2/23>>>2/24 Vanc, start date 2/22>>>2/26 Zosyn, start date 2/22>>>2/26 Clinda 2/24 >> 2/26 Unasyn 2/26>>> IVIG 2/24 >> 2/25  Ortho consult appreciated, no indication for debridement at this time, CT does not show gas or abscess  ENDOCRINE A:   R/o rel AI - cortisol 58 but had received dose IV  R/o sick euthryoid - TSH low: 0.258,  T3 Low (37); free T4 low (0.74)  P:   SSI while on tube feeds Solu-Cortef 50 q6hrs  NEUROLOGIC A:   Acute metabolic encephalopathy Hx Depression, Anxiety, Pain P:   Sedation:  Fentanyl gtt / Versed PRN. RASS goal: -4 Severe dyschrony, int vec NO WUA UDS: Positive for opiods Hold outpatient gabapentin, meclizine, sumatriptan, amitriptyline, duloxetine, MS Contin, percocet, opana ER, sertraline.  FAMILY  - Updates: no family available on am rounds 2/27.    - Inter-disciplinary family meet or Palliative Care meeting due by:  2/28.    Noe Gens, NP-C [facility] Pgr: [phone] or [phone]   09/22/2014, 9:26 AM  Reviewed above, examined.  She remains on increased PEEP and FiO2 and is sedated with RASS -4.  B/l rales.  2+ edema.  Wean PEEP/FiO2 as tolerated.  Continue aggressive diuresis per nephrology.  Continue Abx.  CC time by me independent of APP time is 35 minutes.  Chesley Mires, MD [facility] Pulmonary/Critical Care 09/22/2014, 11:31 AM Pager:  [phone] After 3pm call: [phone]

## 2014-09-22 NOTE — Progress Notes (Signed)
Assessment:  1 Renal Failure 2 Group A strep sepsis 3 SIRS 4 Volume Overload 5 VDRF 6 Anemic  Plan: Begin CRRT  Objective: Vital signs in last 24 hours: Temp:  [97.3 F (36.3 C)-98.7 F (37.1 C)] 97.7 F (36.5 C) (02/27 0742) Pulse Rate:  [93-121] 96 (02/27 0800) Resp:  [11-36] 35 (02/27 0800) BP: (83-141)/(49-75) 116/54 mmHg (02/27 0800) SpO2:  [78 %-100 %] 100 % (02/27 0800) Arterial Line BP: (89-176)/(51-154) 126/59 mmHg (02/27 0700) FiO2 (%):  [60 %-100 %] 70 % (02/27 0800) Weight:  [121.9 kg (268 lb 11.9 oz)] 121.9 kg (268 lb 11.9 oz) (02/27 0600) Weight change: -0.8 kg (-1 lb 12.2 oz)  Intake/Output from previous day: 02/26 0701 - 02/27 0700 In: 2121.9 [I.V.:806.9; NG/GT:1075; IV Piggyback:240] Out: 2300 [Urine:2300] Intake/Output this shift:    overloaded  Lab Results:  Recent Labs  09/21/14 1659 09/22/14 0525  WBC 33.6* 28.7*  HGB 7.8* 7.2*  HCT 23.4* 21.3*  PLT 53* 60*   BMET:  Recent Labs  09/21/14 1659 09/22/14 0525  NA 135 138  K 4.4 3.2*  CL 106 105  CO2 20 25  GLUCOSE 347* 167*  BUN 107* 125*  CREATININE 3.52* 3.60*  CALCIUM 9.7 8.2*   No results for input(s): PTH in the last 72 hours. Iron Studies: No results for input(s): IRON, TIBC, TRANSFERRIN, FERRITIN in the last 72 hours. Studies/Results: Dg Chest Port 1 View  09/22/2014   CLINICAL DATA:  Check endotracheal tube placement  EXAM: PORTABLE CHEST - 1 VIEW  COMPARISON:  09/21/2014  FINDINGS: Cardiac shadow is stable. Endotracheal tube and nasogastric catheter are again noted in satisfactory position. The endotracheal tube lies 5.9 cm above the carina. A left jugular central line is stable. Lungs are well aerated bilaterally with persistent bilateral opacities notable in the right upper lobe. The overall appearance is stable from the prior exam. No new focal abnormality is noted.  IMPRESSION: Tubes and lines as described above.  Stable bilateral opacities worst in the right upper lobe.    Electronically Signed   By: Inez Catalina M.D.   On: 09/22/2014 06:45   Dg Chest Port 1 View  09/21/2014   CLINICAL DATA:  59 year old female with respiratory failure, intubated. Initial encounter.  EXAM: PORTABLE CHEST - 1 VIEW  COMPARISON:  0459 hours today and earlier.  FINDINGS: Portable AP view at 1700 hours. Stable endotracheal tube tip at the level the clavicles. Stable visible enteric tube. Stable left IJ central line. New resuscitation pads or pacing pads project over the chest.  Stable to mildly lower lung volumes. Visible mediastinal contours are stable. No pneumothorax or plural effusion is evident. Streaky perihilar opacity persists, but is regressed compared to 09/20/2014.  IMPRESSION: 1.  Stable lines and tubes. 2. Continued streaky perihilar opacity, improved compared to 09/20/2014.   Electronically Signed   By: Genevie Ann M.D.   On: 09/21/2014 17:12   Dg Chest Port 1 View  09/21/2014   CLINICAL DATA:  Hypoxia  EXAM: PORTABLE CHEST - 1 VIEW  COMPARISON:  September 20, 2014  FINDINGS: Endotracheal tube tip is 4.5 cm above the carina. Central catheter tip is in the superior cava slightly beyond the junction with the left innominate vein. Nasogastric tube tip and side port are in the stomach. No pneumothorax. There is slightly less alveolar consolidation on the left compared to 1 day prior. There is mild atelectasis in the lung right upper lobe. Right lung is otherwise clear. Heart is mildly  enlarged with pulmonary vascularity within normal limits.  IMPRESSION: Tube and catheter positions as described without pneumothorax. There is patchy consolidation throughout the left lung, less pronounced compared to 1 day prior. Mild atelectasis right upper lobe. No new opacity. No change in cardiac silhouette.   Electronically Signed   By: Lowella Grip III M.D.   On: 09/21/2014 07:11   Dg Chest Port 1 View  09/20/2014   CLINICAL DATA:  Acute respiratory failure with hypoxia. Shock. Acute renal failure.   EXAM: PORTABLE CHEST - 1 VIEW  COMPARISON:  09/19/2014  FINDINGS: Support lines and tubes in appropriate position. No pneumothorax identified.  Worsening airspace disease is seen throughout the majority of the left lung and in the right upper lobe. Low lung volumes again noted. Heart size remains stable.  IMPRESSION: Worsening asymmetric airspace disease throughout left lung and right upper lobe.   Electronically Signed   By: Earle Gell M.D.   On: 09/20/2014 09:38   Scheduled: . ampicillin-sulbactam (UNASYN) IV  1.5 g Intravenous Q12H  . antiseptic oral rinse  7 mL Mouth Rinse QID  . chlorhexidine  15 mL Mouth Rinse BID  . furosemide  200 mg Intravenous Q6H  . hydrocortisone sod succinate (SOLU-CORTEF) inj  50 mg Intravenous Q6H  . Influenza vac split quadrivalent PF  0.5 mL Intramuscular Tomorrow-1000  . insulin aspart  0-15 Units Subcutaneous 6 times per day  . ipratropium-albuterol  3 mL Nebulization Q6H  . pantoprazole (PROTONIX) IV  40 mg Intravenous QHS  . pneumococcal 23 valent vaccine  0.5 mL Intramuscular Tomorrow-1000     LOS: 5 days   Marlise Fahr C 09/22/2014,8:15 AM

## 2014-09-22 NOTE — Progress Notes (Signed)
eLink Physician-Brief Progress Note Patient Name: Jamie Fields DOB: [birth year] MRN: [id number]   Date of Service  09/22/2014  HPI/Events of Note  Hypokalemia in the setting of renal failure  eICU Interventions  Potassium replaced     Intervention Category Intermediate Interventions: Electrolyte abnormality - evaluation and management  Lissett Favorite 09/22/2014, 6:28 AM

## 2014-09-23 ENCOUNTER — Inpatient Hospital Stay (HOSPITAL_COMMUNITY): Payer: Commercial Managed Care - HMO

## 2014-09-23 DIAGNOSIS — J189 Pneumonia, unspecified organism: Secondary | ICD-10-CM

## 2014-09-23 LAB — GLUCOSE, CAPILLARY
GLUCOSE-CAPILLARY: 154 mg/dL — AB (ref 70–99)
GLUCOSE-CAPILLARY: 178 mg/dL — AB (ref 70–99)
Glucose-Capillary: 184 mg/dL — ABNORMAL HIGH (ref 70–99)
Glucose-Capillary: 189 mg/dL — ABNORMAL HIGH (ref 70–99)
Glucose-Capillary: 159 mg/dL — ABNORMAL HIGH (ref 70–99)
Glucose-Capillary: 181 mg/dL — ABNORMAL HIGH (ref 70–99)

## 2014-09-23 LAB — HEPATIC FUNCTION PANEL
ALK PHOS: 176 U/L — AB (ref 39–117)
ALT: 202 U/L — ABNORMAL HIGH (ref 0–35)
AST: 132 U/L — AB (ref 0–37)
Albumin: 1.5 g/dL — ABNORMAL LOW (ref 3.5–5.2)
BILIRUBIN TOTAL: 4.5 mg/dL — AB (ref 0.3–1.2)
Bilirubin, Direct: 3.1 mg/dL — ABNORMAL HIGH (ref 0.0–0.5)
Indirect Bilirubin: 1.4 mg/dL — ABNORMAL HIGH (ref 0.3–0.9)
TOTAL PROTEIN: 6.8 g/dL (ref 6.0–8.3)

## 2014-09-23 LAB — BLOOD GAS, ARTERIAL
Acid-base deficit: 0.4 mmol/L (ref 0.0–2.0)
BICARBONATE: 25 meq/L — AB (ref 20.0–24.0)
Drawn by: 10006
FIO2: 0.5 %
MECHVT: 430 mL
O2 Saturation: 90.9 %
PATIENT TEMPERATURE: 98.6
PEEP: 10 cmH2O
PH ART: 7.312 — AB (ref 7.350–7.450)
RATE: 22 resp/min
TCO2: 26.6 mmol/L (ref 0–100)
pCO2 arterial: 50.9 mmHg — ABNORMAL HIGH (ref 35.0–45.0)
pO2, Arterial: 68.2 mmHg — ABNORMAL LOW (ref 80.0–100.0)

## 2014-09-23 LAB — TYPE AND SCREEN
ABO/RH(D): O POS
ANTIBODY SCREEN: POSITIVE
DAT, IgG: NEGATIVE
Donor AG Type: NEGATIVE
Donor AG Type: NEGATIVE
PT AG TYPE: NEGATIVE
UNIT DIVISION: 0
Unit division: 0

## 2014-09-23 LAB — CBC
HCT: 22 % — ABNORMAL LOW (ref 36.0–46.0)
Hemoglobin: 7.6 g/dL — ABNORMAL LOW (ref 12.0–15.0)
MCH: 26.3 pg (ref 26.0–34.0)
MCHC: 34.5 g/dL (ref 30.0–36.0)
MCV: 76.1 fL — ABNORMAL LOW (ref 78.0–100.0)
Platelets: 92 10*3/uL — ABNORMAL LOW (ref 150–400)
RBC: 2.89 MIL/uL — AB (ref 3.87–5.11)
RDW: 16.3 % — ABNORMAL HIGH (ref 11.5–15.5)
WBC: 40.3 10*3/uL — ABNORMAL HIGH (ref 4.0–10.5)

## 2014-09-23 LAB — RENAL FUNCTION PANEL
ALBUMIN: 1.8 g/dL — AB (ref 3.5–5.2)
Anion gap: 9 (ref 5–15)
BUN: 100 mg/dL — ABNORMAL HIGH (ref 6–23)
CO2: 28 mmol/L (ref 19–32)
CREATININE: 2.24 mg/dL — AB (ref 0.50–1.10)
Calcium: 8 mg/dL — ABNORMAL LOW (ref 8.4–10.5)
Chloride: 104 mmol/L (ref 96–112)
GFR calc Af Amer: 26 mL/min — ABNORMAL LOW (ref >90)
GFR, EST NON AFRICAN AMERICAN: 23 mL/min — AB (ref >90)
Glucose, Bld: 174 mg/dL — ABNORMAL HIGH (ref 70–99)
POTASSIUM: 3.1 mmol/L — AB (ref 3.5–5.1)
Phosphorus: 3.5 mg/dL (ref 2.3–4.6)
Sodium: 141 mmol/L (ref 135–145)

## 2014-09-23 LAB — MAGNESIUM: Magnesium: 2.2 mg/dL (ref 1.5–2.5)

## 2014-09-23 MED ORDER — WHITE PETROLATUM GEL
Status: AC
  Administered 2014-09-23: 13:00:00
  Filled 2014-09-23: qty 1

## 2014-09-23 MED ORDER — POTASSIUM CHLORIDE 20 MEQ/15ML (10%) PO SOLN
40.0000 meq | Freq: Once | ORAL | Status: AC
  Administered 2014-09-23: 40 meq
  Filled 2014-09-23: qty 30

## 2014-09-23 MED ORDER — POTASSIUM CHLORIDE 10 MEQ/50ML IV SOLN
INTRAVENOUS | Status: AC
  Administered 2014-09-23: 10 meq
  Filled 2014-09-23: qty 50

## 2014-09-23 MED ORDER — VANCOMYCIN HCL 10 G IV SOLR
1250.0000 mg | INTRAVENOUS | Status: DC
  Filled 2014-09-23: qty 1250

## 2014-09-23 MED ORDER — PRO-STAT SUGAR FREE PO LIQD
30.0000 mL | Freq: Two times a day (BID) | ORAL | Status: DC
  Administered 2014-09-23 – 2014-09-27 (×9): 30 mL via ORAL
  Filled 2014-09-23 (×10): qty 30

## 2014-09-23 MED ORDER — POTASSIUM CHLORIDE 10 MEQ/50ML IV SOLN
INTRAVENOUS | Status: AC
  Filled 2014-09-23: qty 50

## 2014-09-23 MED ORDER — SODIUM CHLORIDE 0.9 % IV SOLN
1750.0000 mg | INTRAVENOUS | Status: AC
  Administered 2014-09-23: 1750 mg via INTRAVENOUS
  Filled 2014-09-23: qty 1750

## 2014-09-23 MED ORDER — POTASSIUM CHLORIDE 10 MEQ/50ML IV SOLN
10.0000 meq | INTRAVENOUS | Status: AC
  Administered 2014-09-23 – 2014-09-24 (×6): 10 meq via INTRAVENOUS
  Filled 2014-09-23 (×6): qty 50

## 2014-09-23 MED ORDER — POTASSIUM CHLORIDE 10 MEQ/50ML IV SOLN
10.0000 meq | INTRAVENOUS | Status: AC
Start: 2014-09-23 — End: 2014-09-23
  Administered 2014-09-23 (×6): 10 meq via INTRAVENOUS
  Filled 2014-09-23 (×5): qty 50

## 2014-09-23 NOTE — Progress Notes (Signed)
CRITICAL VALUE ALERT  Critical value received:  K= 2.6  Date of notification:  09/23/14   Time of notification:  4707  Critical value read back:Yes.    Nurse who received alert:  Delphia Grates  MD notified (1st page):  Dr Johnette Abraham [place]eter[place]  Time of first page:  [phone]  MD notified (2nd page):  Time of second page:  Responding MD:  Dr Patricia Nettle  Time MD responded:  [phone]

## 2014-09-23 NOTE — Progress Notes (Signed)
Assessment:  1 Renal Failure 2 Group A strep sepsis 3 SIRS 4 Volume Overload 5 VDRF, ARDS ?PNA 6 Anemic  Plan:  Continue fluid removal, stop furosemide, k replace  Subjective: Interval History: Good UOP and fluid removal  Objective: Vital signs in last 24 hours: Temp:  [97.3 F (36.3 C)-98.7 F (37.1 C)] 97.5 F (36.4 C) (02/28 0726) Pulse Rate:  [89-108] 98 (02/28 0746) Resp:  [15-32] 30 (02/28 0746) BP: (109-158)/(52-73) 143/58 mmHg (02/28 0746) SpO2:  [89 %-100 %] 94 % (02/28 0746) Arterial Line BP: (121-176)/(53-70) 157/62 mmHg (02/28 0700) FiO2 (%):  [50 %-70 %] 50 % (02/28 0746) Weight:  [116 kg (255 lb 11.7 oz)] 116 kg (255 lb 11.7 oz) (02/28 0400) Weight change: -5.9 kg (-13 lb 0.1 oz)  Intake/Output from previous day: 02/27 0701 - 02/28 0700 In: 2989.3 [I.V.:729.3; NG/GT:1580; IV Piggyback:680] Out: 4332 [Urine:6445] Intake/Output this shift:    General appearance: intubated  Anasarca less Sedated   Lab Results:  Recent Labs  09/22/14 0525 09/23/14 0420  WBC 28.7* 40.3*  HGB 7.2* 7.6*  HCT 21.3* 22.0*  PLT 60* 92*   BMET:  Recent Labs  09/22/14 1730 09/23/14 0420  NA 141 141  K 3.1* 2.6*  CL 105 102  CO2 24 28  GLUCOSE 167* 197*  BUN 132* 113*  CREATININE 3.49* 2.79*  CALCIUM 8.3* 8.0*   No results for input(s): PTH in the last 72 hours. Iron Studies: No results for input(s): IRON, TIBC, TRANSFERRIN, FERRITIN in the last 72 hours. Studies/Results: Dg Chest Port 1 View  09/22/2014   CLINICAL DATA:  Acute respiratory failure, check HD catheter placement  EXAM: PORTABLE CHEST - 1 VIEW  COMPARISON:  09/22/2014  FINDINGS: Multifocal patchy opacities in the left lung and right upper lobe, suspicious for multifocal pneumonia.  No pleural effusion or pneumothorax.  Endotracheal tube terminates 4.5 cm above the carina.  Left IJ venous catheter terminates in the SVC. Right IJ hemodialysis catheter terminates in the mid SVC.  IMPRESSION: Right IJ  hemodialysis catheter terminates in the mid SVC.  Endotracheal tube terminates 4.5 cm above the carina.  Multifocal patchy opacities in the left lung and right upper lobe, suspicious for multifocal pneumonia.   Electronically Signed   By: Julian Hy M.D.   On: 09/22/2014 15:59   Dg Chest Port 1 View  09/22/2014   CLINICAL DATA:  Check endotracheal tube placement  EXAM: PORTABLE CHEST - 1 VIEW  COMPARISON:  09/21/2014  FINDINGS: Cardiac shadow is stable. Endotracheal tube and nasogastric catheter are again noted in satisfactory position. The endotracheal tube lies 5.9 cm above the carina. A left jugular central line is stable. Lungs are well aerated bilaterally with persistent bilateral opacities notable in the right upper lobe. The overall appearance is stable from the prior exam. No new focal abnormality is noted.  IMPRESSION: Tubes and lines as described above.  Stable bilateral opacities worst in the right upper lobe.   Electronically Signed   By: Inez Catalina M.D.   On: 09/22/2014 06:45   Dg Chest Port 1 View  09/21/2014   CLINICAL DATA:  59 year old female with respiratory failure, intubated. Initial encounter.  EXAM: PORTABLE CHEST - 1 VIEW  COMPARISON:  0459 hours today and earlier.  FINDINGS: Portable AP view at 1700 hours. Stable endotracheal tube tip at the level the clavicles. Stable visible enteric tube. Stable left IJ central line. New resuscitation pads or pacing pads project over the chest.  Stable to mildly  lower lung volumes. Visible mediastinal contours are stable. No pneumothorax or plural effusion is evident. Streaky perihilar opacity persists, but is regressed compared to 09/20/2014.  IMPRESSION: 1.  Stable lines and tubes. 2. Continued streaky perihilar opacity, improved compared to 09/20/2014.   Electronically Signed   By: Genevie Ann M.D.   On: 09/21/2014 17:12    Scheduled: . ampicillin-sulbactam (UNASYN) IV  3 g Intravenous 3 times per day  . antiseptic oral rinse  7 mL Mouth  Rinse QID  . chlorhexidine  15 mL Mouth Rinse BID  . furosemide  200 mg Intravenous Q6H  . hydrocortisone sod succinate (SOLU-CORTEF) inj  50 mg Intravenous Q6H  . insulin aspart  0-15 Units Subcutaneous 6 times per day  . ipratropium-albuterol  3 mL Nebulization Q6H  . pantoprazole sodium  40 mg Per Tube Daily  . potassium chloride  10 mEq Intravenous Q1 Hr x 6     LOS: 6 days   Jarae Nemmers C 09/23/2014,8:42 AM

## 2014-09-23 NOTE — Progress Notes (Signed)
PULMONARY / CRITICAL CARE MEDICINE   Name: Jamie Fields MRN: [id number] DOB: June 08, 1956    ADMISSION DATE:  09/17/2014 CONSULTATION DATE:  09/17/2014   REFERRING MD :  [facility]  CHIEF COMPLAINT:  Respiratory Distress  INITIAL PRESENTATION: 59 y.o. F who was taken to [facility] ED early AM hours 2/22 for respiratory distress.  She was admitted and placed on BiPAP.  Was also found to have acute renal failure, AG metabolic acidosis, Overnight, her acidosis and respiratory status worsened to the point she required intubation.  She was also placed on levophed for persistent shock.  Later in day 2/22 was transferred to [facility] for further management.  STUDIES:  CT Chest 2/22 >> no acute process CT abd / pelv 2/22 >> iliac lymph nodes likely reactive.  No acute process CXR 2/23  >> Persistent mild vascular congestion and mild interstitial edema bilaterally. Superimposed infiltrate right upper lobe and left base cannot be excluded. Echo 6/71 >> Systolic function was normal. EF 60% to 65%. Right Tib/Fib DG >> No evidence of osteo; Mild soft tissue swelling RUQ [place] 2/23 >> Liver echogenicity is diffusely increased and somewhat heterogeneous. Most likely are indicative of hepatic steatosis CT tib/fib Rt >> Negative for osteomyelitis or necrotizing fasciitis. Superficial edema and blistering  SIGNIFICANT EVENTS: 2/22  Admitted at [place]; respiratory failure-intubated; shock; transferred to [facility] 2/23  On [place]; intubated; Fever, CXR infiltrate = sepsis 2/24  Group A strep, added Clinda; CT neg for nec fas; Tube feeds started 2/26  Hypoxia, brady arrest in pm  2/27  CVVHD initiated  2/28  Pt net neg 6L   SUBJECTIVE: RN reports pt net neg 6L with CVVVHD + lasix, required increase in sedation overnight due to vent asynchrony.  WUA with dyschrony with vent, no waking or follow commands  VITAL SIGNS: Temp:  [97.3 F (36.3 C)-98.7 F (37.1 C)] 97.5 F (36.4 C) (02/28 0726) Pulse Rate:   [89-108] 98 (02/28 0746) Resp:  [15-32] 30 (02/28 0746) BP: (109-158)/(52-73) 143/58 mmHg (02/28 0746) SpO2:  [89 %-100 %] 94 % (02/28 0746) Arterial Line BP: (121-176)/(53-70) 157/62 mmHg (02/28 0700) FiO2 (%):  [50 %-70 %] 50 % (02/28 0746) Weight:  [255 lb 11.7 oz (116 kg)] 255 lb 11.7 oz (116 kg) (02/28 0400)   HEMODYNAMICS: CVP:  [13 mmHg] 13 mmHg   VENTILATOR SETTINGS: Vent Mode:  [-] PRVC FiO2 (%):  [50 %-70 %] 50 % Set Rate:  [22 bmp] 22 bmp Vt Set:  [430 mL] 430 mL PEEP:  [10 cmH20] 10 cmH20 Plateau Pressure:  [26 cmH20] 26 cmH20   INTAKE / OUTPUT:  Intake/Output Summary (Last 24 hours) at 09/23/14 0847 Last data filed at 09/23/14 0700  Gross per 24 hour  Intake 2887.25 ml  Output   9263 ml  Net -6375.75 ml   PHYSICAL EXAMINATION: General: Obese female, in NAD Neuro: Sedated on vent. Rass -3 HEENT: [place]/AT. PERRL, sclerae icteric. Cardiovascular: RRR, no M/R/G.  Lungs: even/non-labored on vent, coarse bilaterally Abdomen: Obese, BS x 4, soft, NT/ND.  Musculoskeletal: No gross deformities, generalized edema improved.  Skin: RLE dressing c/d/i, bulla on R inner thigh ruptured  LABS:  CBC  Recent Labs Lab 09/21/14 1659 09/22/14 0525 09/23/14 0420  WBC 33.6* 28.7* 40.3*  HGB 7.8* 7.2* 7.6*  HCT 23.4* 21.3* 22.0*  PLT 53* 60* 92*   Coag's  Recent Labs Lab 09/17/14 2150 09/20/14 0610 09/21/14 1659  APTT  --  37  --   INR  1.63* 1.30 1.39   BMET  Recent Labs Lab 09/22/14 0525 09/22/14 1730 09/23/14 0420  NA 138 141 141  K 3.2* 3.1* 2.6*  CL 105 105 102  CO2 25 24 28   BUN 125* 132* 113*  CREATININE 3.60* 3.49* 2.79*  GLUCOSE 167* 167* 197*   Electrolytes  Recent Labs Lab 09/21/14 0505  09/22/14 0525 09/22/14 1730 09/23/14 0420  CALCIUM 7.5*  < > 8.2* 8.3* 8.0*  MG 2.3  --  2.3  --  2.2  PHOS 5.1*  --  4.9* 5.5* 4.4  < > = values in this interval not displayed.   Sepsis Markers  Recent Labs Lab 09/17/14 2200  09/20/14 0330  09/21/14 1659 09/21/14 1959  LATICACIDVEN 4.8*  < > 1.7 3.3* 1.4  PROCALCITON 64.94  --   --   --   --   < > = values in this interval not displayed.   ABG  Recent Labs Lab 09/21/14 2108 09/22/14 0500 09/23/14 0410  PHART 7.256* 7.254* 7.312*  PCO2ART 47.5* 47.9* 50.9*  PO2ART 78.3* 79.2* 68.2*   Liver Enzymes  Recent Labs Lab 09/19/14 0327 09/21/14 1659 09/22/14 1730 09/23/14 0420  AST 62* 228*  --  132*  ALT 61* 219*  --  202*  ALKPHOS 36* 101  --  176*  BILITOT 2.3* 2.1*  --  4.5*  ALBUMIN 1.8* 1.2* 1.4* 1.5*  1.5*   Cardiac Enzymes  Recent Labs Lab 09/18/14 0730 09/19/14 0745 09/21/14 1659  TROPONINI 0.10* 0.09* 0.28*   Glucose  Recent Labs Lab 09/22/14 1054 09/22/14 1511 09/22/14 2017 09/23/14 0023 09/23/14 0420 09/23/14 0657  GLUCAP 156* 140* 144* 154* 184* 189*    Imaging Dg Chest Port 1 View  09/22/2014   CLINICAL DATA:  Acute respiratory failure, check HD catheter placement  EXAM: PORTABLE CHEST - 1 VIEW  COMPARISON:  09/22/2014  FINDINGS: Multifocal patchy opacities in the left lung and right upper lobe, suspicious for multifocal pneumonia.  No pleural effusion or pneumothorax.  Endotracheal tube terminates 4.5 cm above the carina.  Left IJ venous catheter terminates in the SVC. Right IJ hemodialysis catheter terminates in the mid SVC.  IMPRESSION: Right IJ hemodialysis catheter terminates in the mid SVC.  Endotracheal tube terminates 4.5 cm above the carina.  Multifocal patchy opacities in the left lung and right upper lobe, suspicious for multifocal pneumonia.   Electronically Signed   By: Julian Hy M.D.   On: 09/22/2014 15:59   Dg Chest Port 1 View  09/22/2014   CLINICAL DATA:  Check endotracheal tube placement  EXAM: PORTABLE CHEST - 1 VIEW  COMPARISON:  09/21/2014  FINDINGS: Cardiac shadow is stable. Endotracheal tube and nasogastric catheter are again noted in satisfactory position. The endotracheal tube lies 5.9 cm above the carina.  A left jugular central line is stable. Lungs are well aerated bilaterally with persistent bilateral opacities notable in the right upper lobe. The overall appearance is stable from the prior exam. No new focal abnormality is noted.  IMPRESSION: Tubes and lines as described above.  Stable bilateral opacities worst in the right upper lobe.   Electronically Signed   By: Inez Catalina M.D.   On: 09/22/2014 06:45   ASSESSMENT / PLAN:  PULMONARY OETT 2/22 >>> A: Acute hypoxic respiratory failure COPD without evidence of exacerbation ARDS P:   PRVC, lung protective ventilation  Keep plat less 30, Vt @ 7 cc/kg.  Increase rr to 24 DuoNebs / Albuterol PRN Trend cxr /  ABG Volume removal per Renal, hope to unload volume and then be able to wean D7 vent, may require trach to get her through the rehab phase of illness  CARDIOVASCULAR CVL R fem 2/22 >> 2/23 CVL L IJ 2/23 >> Aline L rad 2/23 >> A:  Shock - Septic +/-  Hypovolemic Lactic Acidosis - resolved 2/25 Hx HTN Adrenal Insufficiency - unfortunately received steroids before level, so empirically treat Sinus tachy P:  Goal MAP > 65. Empiric steroids started 2/23 Hold outpatient atorvastatin.  RENAL A:   Mixed metabolic acidosis - due to lactate & renal failure Acute renal failure - Baseline Cr unknown, no hydro on CT.  UA, urine na, osm: Granular cast; FENa 0.36 % Rhabdo - improving with IVF resuscitation Hyponatremia Hypokalemia  Pseudohypocalcemia - corrects to wnl P:   Trend BMP  CVVHD per Renal  KVO IVF. Renal consulted, appreciate input  Replace electrolytes as indicated, KCL 39meq x 6 runs 2/28  GASTROINTESTINAL A:  Transaminitis - Improving GERD Obesity  Severe protein calorie malnutrition  P:   Trend LFT's, modest improvement Pantoprazole. Tube feeds started 2/24 + SSI Add prostat 2/28 See ID  HEMATOLOGIC A:   Anemia VTE prophylaxis Thrombocytopenia - improving P:  Transfuse per ICU guidelines. DVT  proph:  SCD's Monitor platelets, no acute bleeding.  Will not transfuse unless hemorrhage is noted or if to OR. CBC in AM.  INFECTIOUS A:   Group A Strep Septic Shock P:   BCx2 2/22 >> Resp sputum >>> RARE GROUP A STREP (S.PYOGENES) ISOLATED  Levofloxacin 2/23>>>2/24 Vanc, start date 2/22>>>2/26 Zosyn, start date 2/22>>>2/26 Clinda 2/24 >> 2/26 Unasyn 2/26 >> IVIG 2/24 >> 2/25  Ortho consult appreciated, no indication for debridement at this time, CT does not show gas or abscess   ENDOCRINE A:   R/o rel AI - cortisol 58 but had received dose IV  R/o sick euthryoid - TSH low: 0.258,  T3 Low (37); free T4 low (0.74)  P:   SSI while on tube feeds Solu-Cortef 50 q6hrs  NEUROLOGIC A:   Acute metabolic encephalopathy Hx Depression, Anxiety, Pain P:   Sedation:  Fentanyl gtt / Versed gtt RASS goal: -3 Severe dyschrony, int vec WUA as tolerated  UDS: Positive for opiods Hold outpatient gabapentin, meclizine, sumatriptan, amitriptyline, duloxetine, MS Contin, percocet, opana ER, sertraline.  FAMILY  - Updates: family updated 2/28 am.  They are planning a trip and will be leaving the country on 3/1 depending on patients status.  Will need to discuss possibility of trach prior to leaving.  They are aware she may need this and are agreeable to procedure. Will need further discussion / consent via son.    - Inter-disciplinary family meet or Palliative Care meeting due by:  2/28.    Noe Gens, NP-C [place] [facility] Pgr: [phone] or [phone]   09/23/2014, 8:47 AM  Reviewed above, examined.  Remains on full vent support, CRRT.  Has progressive infiltrate on CXR in spite of negative fluid balance >> likely from PNA.  Will add vancomycin and repeat Cxs.  Has b/l crackles, 2+ edema.  CC time by me independent of APP time 35 minutes.  Chesley Mires, MD [facility] Pulmonary/Critical Care 09/23/2014, 11:32 AM Pager:  [phone] After 3pm call: [phone]

## 2014-09-23 NOTE — Progress Notes (Signed)
ANTIBIOTIC CONSULT NOTE   Pharmacy Consult for Unasyn Indication: GAS bacteremia, pna  No Known Allergies  Patient Measurements: Height: 5\' 7"  (170.2 cm) Weight: 255 lb 11.7 oz (116 kg) IBW/kg (Calculated) : 61.6  Vital Signs: Temp: 98.3 F (36.8 C) (02/28 1202) Temp Source: Oral (02/28 1202) BP: 143/58 mmHg (02/28 0746) Pulse Rate: 98 (02/28 0746) Intake/Output from previous day: 02/27 0701 - 02/28 0700 In: 2989.3 [I.V.:729.3; NG/GT:1580; IV Piggyback:680] Out: 5093 [Urine:6445] Intake/Output from this shift: Total I/O In: -  Out: 824 [Other:824]  Labs:  Recent Labs  09/21/14 1659 09/22/14 0525 09/22/14 1730 09/23/14 0420  WBC 33.6* 28.7*  --  40.3*  HGB 7.8* 7.2*  --  7.6*  PLT 53* 60*  --  92*  CREATININE 3.52* 3.60* 3.49* 2.79*   Estimated Creatinine Clearance: 28.6 mL/min (by C-G formula based on Cr of 2.79). No results for input(s): VANCOTROUGH, VANCOPEAK, VANCORANDOM, GENTTROUGH, GENTPEAK, GENTRANDOM, TOBRATROUGH, TOBRAPEAK, TOBRARND, AMIKACINPEAK, AMIKACINTROU, AMIKACIN in the last 72 hours.   Microbiology: Recent Results (from the past 720 hour(s))  MRSA PCR Screening     Status: None   Collection Time: 09/17/14  9:25 PM  Result Value Ref Range Status   MRSA by PCR NEGATIVE NEGATIVE Final    Comment:        The GeneXpert MRSA Assay (FDA approved for NASAL specimens only), is one component of a comprehensive MRSA colonization surveillance program. It is not intended to diagnose MRSA infection nor to guide or monitor treatment for MRSA infections.   Culture, Urine     Status: None   Collection Time: 09/18/14  8:26 AM  Result Value Ref Range Status   Specimen Description URINE, CATHETERIZED  Final   Special Requests Normal  Final   Colony Count NO GROWTH Performed at [facility]   Final   Culture NO GROWTH Performed at [facility]   Final   Report Status 09/19/2014 FINAL  Final  Blood culture (routine x 2)     Status:  None (Preliminary result)   Collection Time: 09/18/14  9:05 AM  Result Value Ref Range Status   Specimen Description BLOOD LEFT HAND  Final   Special Requests BOTTLES DRAWN AEROBIC ONLY 2CC  Final   Culture   Final           BLOOD CULTURE RECEIVED NO GROWTH TO DATE CULTURE WILL BE HELD FOR 5 DAYS BEFORE ISSUING A FINAL NEGATIVE REPORT Performed at [facility]    Report Status PENDING  Incomplete  Respiratory virus panel (routine influenza)     Status: None   Collection Time: 09/18/14  9:12 AM  Result Value Ref Range Status   Source - RVPAN NOSE  Corrected   Respiratory Syncytial Virus A Negative Negative Final   Respiratory Syncytial Virus B Negative Negative Final   Influenza A Negative Negative Final   Influenza B Negative Negative Final   Parainfluenza 1 Negative Negative Final   Parainfluenza 2 Negative Negative Final   Parainfluenza 3 Negative Negative Final   Metapneumovirus Negative Negative Final   Rhinovirus Negative Negative Final   Adenovirus Negative Negative Final    Comment: (NOTE) Performed At: [facility] [place], [place] [id number] Lindon Romp MD DX:[id number]   Blood culture (routine x 2)     Status: None (Preliminary result)   Collection Time: 09/18/14 10:30 AM  Result Value Ref Range Status   Specimen Description BLOOD LEFT CENTRAL LINE  Final   Special  Requests BOTTLES DRAWN AEROBIC AND ANAEROBIC 10CC  Final   Culture   Final           BLOOD CULTURE RECEIVED NO GROWTH TO DATE CULTURE WILL BE HELD FOR 5 DAYS BEFORE ISSUING A FINAL NEGATIVE REPORT Performed at [facility]    Report Status PENDING  Incomplete  Culture, respiratory (NON-Expectorated)     Status: None   Collection Time: 09/18/14 11:34 AM  Result Value Ref Range Status   Specimen Description TRACHEAL ASPIRATE  Final   Special Requests NONE  Final   Gram Stain   Final    ABUNDANT WBC PRESENT,BOTH PMN AND MONONUCLEAR RARE SQUAMOUS EPITHELIAL  CELLS PRESENT RARE YEAST Performed at [facility]    Culture   Final    FEW YEAST CONSISTENT WITH CANDIDA SPECIES RARE GROUP A STREP (S.PYOGENES) ISOLATED Performed at [facility]    Report Status 09/20/2014 FINAL  Final  Blood culture (routine x 2)     Status: None (Preliminary result)   Collection Time: 09/19/14  5:00 PM  Result Value Ref Range Status   Specimen Description BLOOD RIGHT ARM  Final   Special Requests BOTTLES DRAWN AEROBIC ONLY 10CC  Final   Culture   Final           BLOOD CULTURE RECEIVED NO GROWTH TO DATE CULTURE WILL BE HELD FOR 5 DAYS BEFORE ISSUING A FINAL NEGATIVE REPORT Performed at [facility]    Report Status PENDING  Incomplete  Blood culture (routine x 2)     Status: None (Preliminary result)   Collection Time: 09/19/14  5:20 PM  Result Value Ref Range Status   Specimen Description BLOOD RIGHT HAND  Final   Special Requests BOTTLES DRAWN AEROBIC ONLY 5CC  Final   Culture   Final           BLOOD CULTURE RECEIVED NO GROWTH TO DATE CULTURE WILL BE HELD FOR 5 DAYS BEFORE ISSUING A FINAL NEGATIVE REPORT Performed at [facility]    Report Status PENDING  Incomplete    Assessment: 59 y.o. female on Unasyn (total abx D#6) for GAS in blood at [facility] and GAS in trach asp here also. Now to restart Vancomycin for PNA. CXR shows multifocal PNA vs edema. Afeb. WBC trending up to 28.7. LA down. PCT 65. No debridement at this time per ortho. Received IVIG 1gm/kg on 2/24 and 2/25.   Nephrology: ATN. CRRT started 2/27 for volume removal (Neg 6L yday). Tolerating CRRT. UOP 2.3 ml/kg/hr (s/p high dose lasix).  2/22 Vanc >>2/26 2/22 Zosyn >>2/26 2/22 Levaquin >>2/24 2/23 Tamiflu >>2/24 2/24 Clindamycin >>2/26 Unasyn 2/26>>  2/23 MRSA PCR neg 2/23 Flu neg 2/23 Urine >>neg 2/23 TA >> few candida albicans; rare GAS 2/23 Blood >> ngtd  blood >> 1/2 GAS (do not conduct sensirivities)  Goal of Therapy:  Resolution of  infection  Vancomycin trough 15-20 mcg/ml  Plan:  Continue Unasyn 3gm IV q8h Vancomycin 1750mg  now then 1250mg  IV q24h F/u CRRT tolerance, micro data, pt's clinical condition VT at [facility]  Sherlon Handing, PharmD, BCPS Clinical pharmacist, pager [phone] 09/23/2014,12:41 PM

## 2014-09-23 NOTE — Progress Notes (Signed)
[place] Progress Note Patient Name: Jamie Fields DOB: [birth year] MRN: [id number]   Date of Service  09/23/2014  HPI/Events of Note  hypokalemia  eICU Interventions  repleted     Intervention Category Intermediate Interventions: Electrolyte abnormality - evaluation and management  Merton Border 09/23/2014, 7:13 PM

## 2014-09-23 NOTE — Progress Notes (Signed)
Subjective:    f/u possible cellulitis RLE Patient remains intubated on hemodialysis now. Family at bedside.    Objective: Vital signs in last 24 hours: Temp:  [97.3 F (36.3 C)-98.7 F (37.1 C)] 97.5 F (36.4 C) (02/28 0726) Pulse Rate:  [89-108] 98 (02/28 0746) Resp:  [15-32] 30 (02/28 0746) BP: (109-158)/(52-73) 143/58 mmHg (02/28 0746) SpO2:  [89 %-100 %] 94 % (02/28 0746) Arterial Line BP: (123-176)/(53-70) 157/62 mmHg (02/28 0700) FiO2 (%):  [50 %-70 %] 50 % (02/28 0746) Weight:  [116 kg (255 lb 11.7 oz)] 116 kg (255 lb 11.7 oz) (02/28 0400)  Intake/Output from previous day: 02/27 0701 - 02/28 0700 In: 2989.3 [I.V.:729.3; NG/GT:1580; IV Piggyback:680] Out: 9263 [Urine:6445] Intake/Output this shift: Total I/O In: -  Out: 261 [Other:261]   Recent Labs  09/21/14 0505 09/21/14 1659 09/22/14 0525 09/23/14 0420  HGB 7.3* 7.8* 7.2* 7.6*    Recent Labs  09/22/14 0525 09/23/14 0420  WBC 28.7* 40.3*  RBC 2.74* 2.89*  HCT 21.3* 22.0*  PLT 60* 92*    Recent Labs  09/22/14 1730 09/23/14 0420  NA 141 141  K 3.1* 2.6*  CL 105 102  CO2 24 28  BUN 132* 113*  CREATININE 3.49* 2.79*  GLUCOSE 167* 197*  CALCIUM 8.3* 8.0*    Recent Labs  09/21/14 1659  INR 1.39    Neurovascular intact Intact pulses distally No cellulitis present Compartment soft multiple blisters improving in appearance mild erythema minimal drainage   Assessment/Plan:    Still no indication for debridement RLE, blistering improving as edema improves on hemodialysis, will continue to monitor   Jamie Fields 09/23/2014, 9:27 AM

## 2014-09-23 NOTE — Progress Notes (Signed)
eLink Physician-Brief Progress Note Patient Name: Jamie Fields DOB: [birth year] MRN: [id number]   Date of Service  09/23/2014  HPI/Events of Note  Hypokalemia in the setting of CRRT and ongoing diuresis  eICU Interventions  Plan: IV K replacement 60 mEq Oral K replacement 40 mEq     Intervention Category Major Interventions: Electrolyte abnormality - evaluation and management  [name] 09/23/2014, 5:39 AM

## 2014-09-24 ENCOUNTER — Inpatient Hospital Stay (HOSPITAL_COMMUNITY): Payer: Commercial Managed Care - HMO

## 2014-09-24 LAB — BLOOD GAS, ARTERIAL
ACID-BASE DEFICIT: 5.4 mmol/L — AB (ref 0.0–2.0)
Bicarbonate: 20.7 mEq/L (ref 20.0–24.0)
DRAWN BY: 36496
FIO2: 0.7 %
MECHVT: 430 mL
O2 SAT: 93.9 %
PATIENT TEMPERATURE: 97.6
PEEP: 12 cmH2O
PH ART: 7.254 — AB (ref 7.350–7.450)
RATE: 35 resp/min
TCO2: 22.2 mmol/L (ref 0–100)
pCO2 arterial: 47.9 mmHg — ABNORMAL HIGH (ref 35.0–45.0)
pO2, Arterial: 79.2 mmHg — ABNORMAL LOW (ref 80.0–100.0)

## 2014-09-24 LAB — CBC
HCT: 22.1 % — ABNORMAL LOW (ref 36.0–46.0)
Hemoglobin: 7.7 g/dL — ABNORMAL LOW (ref 12.0–15.0)
MCH: 26.3 pg (ref 26.0–34.0)
MCHC: 34.8 g/dL (ref 30.0–36.0)
MCV: 75.4 fL — ABNORMAL LOW (ref 78.0–100.0)
PLATELETS: 125 10*3/uL — AB (ref 150–400)
RBC: 2.93 MIL/uL — AB (ref 3.87–5.11)
RDW: 16.2 % — ABNORMAL HIGH (ref 11.5–15.5)
WBC: 41.3 10*3/uL — AB (ref 4.0–10.5)

## 2014-09-24 LAB — RENAL FUNCTION PANEL
ALBUMIN: 1.7 g/dL — AB (ref 3.5–5.2)
ANION GAP: 7 (ref 5–15)
Albumin: 1.5 g/dL — ABNORMAL LOW (ref 3.5–5.2)
Albumin: 1.9 g/dL — ABNORMAL LOW (ref 3.5–5.2)
Anion gap: 11 (ref 5–15)
Anion gap: 5 (ref 5–15)
BUN: 113 mg/dL — ABNORMAL HIGH (ref 6–23)
BUN: 96 mg/dL — ABNORMAL HIGH (ref 6–23)
BUN: 87 mg/dL — ABNORMAL HIGH (ref 6–23)
CALCIUM: 7.8 mg/dL — AB (ref 8.4–10.5)
CO2: 28 mmol/L (ref 19–32)
CO2: 31 mmol/L (ref 19–32)
CO2: 27 mmol/L (ref 19–32)
CREATININE: 2.79 mg/dL — AB (ref 0.50–1.10)
CREATININE: 1.67 mg/dL — AB (ref 0.50–1.10)
Calcium: 8 mg/dL — ABNORMAL LOW (ref 8.4–10.5)
Calcium: 8 mg/dL — ABNORMAL LOW (ref 8.4–10.5)
Chloride: 102 mmol/L (ref 96–112)
Chloride: 103 mmol/L (ref 96–112)
Chloride: 105 mmol/L (ref 96–112)
Creatinine, Ser: 1.98 mg/dL — ABNORMAL HIGH (ref 0.50–1.10)
GFR calc Af Amer: 31 mL/min — ABNORMAL LOW (ref >90)
GFR calc Af Amer: 38 mL/min — ABNORMAL LOW (ref >90)
GFR calc non Af Amer: 17 mL/min — ABNORMAL LOW (ref >90)
GFR calc non Af Amer: 33 mL/min — ABNORMAL LOW (ref >90)
GFR, EST AFRICAN AMERICAN: 20 mL/min — AB (ref >90)
GFR, EST NON AFRICAN AMERICAN: 26 mL/min — AB (ref >90)
GLUCOSE: 188 mg/dL — AB (ref 70–99)
Glucose, Bld: 197 mg/dL — ABNORMAL HIGH (ref 70–99)
Glucose, Bld: 216 mg/dL — ABNORMAL HIGH (ref 70–99)
PHOSPHORUS: 3 mg/dL (ref 2.3–4.6)
Phosphorus: 4.4 mg/dL (ref 2.3–4.6)
Phosphorus: 3.7 mg/dL (ref 2.3–4.6)
Potassium: 2.6 mmol/L — CL (ref 3.5–5.1)
Potassium: 3.2 mmol/L — ABNORMAL LOW (ref 3.5–5.1)
Potassium: 3.1 mmol/L — ABNORMAL LOW (ref 3.5–5.1)
SODIUM: 139 mmol/L (ref 135–145)
Sodium: 141 mmol/L (ref 135–145)
Sodium: 139 mmol/L (ref 135–145)

## 2014-09-24 LAB — CULTURE, BLOOD (ROUTINE X 2)
CULTURE: NO GROWTH
Culture: NO GROWTH

## 2014-09-24 LAB — GLUCOSE, CAPILLARY
GLUCOSE-CAPILLARY: 195 mg/dL — AB (ref 70–99)
GLUCOSE-CAPILLARY: 160 mg/dL — AB (ref 70–99)
GLUCOSE-CAPILLARY: 131 mg/dL — AB (ref 70–99)
Glucose-Capillary: 156 mg/dL — ABNORMAL HIGH (ref 70–99)
Glucose-Capillary: 177 mg/dL — ABNORMAL HIGH (ref 70–99)
Glucose-Capillary: 190 mg/dL — ABNORMAL HIGH (ref 70–99)

## 2014-09-24 LAB — POCT I-STAT 3, ART BLOOD GAS (G3+)
ACID-BASE EXCESS: 2 mmol/L (ref 0.0–2.0)
BICARBONATE: 27.5 meq/L — AB (ref 20.0–24.0)
O2 Saturation: 98 %
TCO2: 29 mmol/L (ref 0–100)
pCO2 arterial: 45.2 mmHg — ABNORMAL HIGH (ref 35.0–45.0)
pH, Arterial: 7.393 (ref 7.350–7.450)
pO2, Arterial: 115 mmHg — ABNORMAL HIGH (ref 80.0–100.0)

## 2014-09-24 LAB — LACTIC ACID, PLASMA: LACTIC ACID, VENOUS: 1.6 mmol/L (ref 0.5–2.0)

## 2014-09-24 LAB — CLOSTRIDIUM DIFFICILE BY PCR: Toxigenic C. Difficile by PCR: NEGATIVE

## 2014-09-24 LAB — LACTATE DEHYDROGENASE: LDH: 564 U/L — ABNORMAL HIGH (ref 94–250)

## 2014-09-24 LAB — MAGNESIUM: MAGNESIUM: 2.3 mg/dL (ref 1.5–2.5)

## 2014-09-24 MED ORDER — PRISMASOL BGK 4/2.5 32-4-2.5 MEQ/L IV SOLN
INTRAVENOUS | Status: AC
  Filled 2014-09-24: qty 5000

## 2014-09-24 MED ORDER — POTASSIUM CHLORIDE 10 MEQ/50ML IV SOLN
INTRAVENOUS | Status: AC
  Filled 2014-09-24: qty 50

## 2014-09-24 MED ORDER — PRISMASOL BGK 4/2.5 32-4-2.5 MEQ/L IV SOLN
INTRAVENOUS | Status: AC
  Filled 2014-09-24 (×5): qty 5000

## 2014-09-24 MED ORDER — HEPARIN SODIUM (PORCINE) 5000 UNIT/ML IJ SOLN
5000.0000 [IU] | Freq: Three times a day (TID) | INTRAMUSCULAR | Status: DC
  Administered 2014-09-24 – 2014-10-11 (×50): 5000 [IU] via SUBCUTANEOUS
  Filled 2014-09-24 (×59): qty 1

## 2014-09-24 MED ORDER — HEPARIN SODIUM (PORCINE) 1000 UNIT/ML DIALYSIS
1000.0000 [IU] | INTRAMUSCULAR | Status: DC | PRN
  Administered 2014-09-24: 2400 [IU] via INTRAVENOUS_CENTRAL
  Filled 2014-09-24: qty 6
  Filled 2014-09-24: qty 3
  Filled 2014-09-24: qty 6

## 2014-09-24 MED ORDER — POTASSIUM CHLORIDE 10 MEQ/50ML IV SOLN
10.0000 meq | INTRAVENOUS | Status: AC
  Administered 2014-09-24 (×6): 10 meq via INTRAVENOUS
  Filled 2014-09-24 (×5): qty 50

## 2014-09-24 MED ORDER — POTASSIUM CHLORIDE 20 MEQ/15ML (10%) PO SOLN
40.0000 meq | Freq: Once | ORAL | Status: AC
  Administered 2014-09-24: 40 meq
  Filled 2014-09-24 (×2): qty 30

## 2014-09-24 MED ORDER — SODIUM CHLORIDE 0.9 % IV SOLN
1.5000 g | Freq: Two times a day (BID) | INTRAVENOUS | Status: DC
  Administered 2014-09-25 – 2014-09-26 (×3): 1.5 g via INTRAVENOUS
  Filled 2014-09-24 (×4): qty 1.5

## 2014-09-24 MED ORDER — INSULIN ASPART 100 UNIT/ML ~~LOC~~ SOLN
0.0000 [IU] | SUBCUTANEOUS | Status: DC
  Administered 2014-09-24: 2 [IU] via SUBCUTANEOUS
  Administered 2014-09-24: 4 [IU] via SUBCUTANEOUS
  Administered 2014-09-24: 7 [IU] via SUBCUTANEOUS
  Administered 2014-09-24 – 2014-09-25 (×3): 4 [IU] via SUBCUTANEOUS
  Administered 2014-09-25: 3 [IU] via SUBCUTANEOUS
  Administered 2014-09-25: 4 [IU] via SUBCUTANEOUS
  Administered 2014-09-25 (×2): 3 [IU] via SUBCUTANEOUS
  Administered 2014-09-26 (×2): 4 [IU] via SUBCUTANEOUS
  Administered 2014-09-26: 11 [IU] via SUBCUTANEOUS
  Administered 2014-09-26 (×2): 3 [IU] via SUBCUTANEOUS
  Administered 2014-09-26: 4 [IU] via SUBCUTANEOUS
  Administered 2014-09-27 (×2): 7 [IU] via SUBCUTANEOUS
  Administered 2014-09-27 (×3): 4 [IU] via SUBCUTANEOUS
  Administered 2014-09-27: 7 [IU] via SUBCUTANEOUS
  Administered 2014-09-28 (×2): 3 [IU] via SUBCUTANEOUS
  Administered 2014-09-28 (×2): 4 [IU] via SUBCUTANEOUS
  Administered 2014-09-28: 3 [IU] via SUBCUTANEOUS
  Administered 2014-09-29: 4 [IU] via SUBCUTANEOUS
  Administered 2014-09-29 (×2): 3 [IU] via SUBCUTANEOUS
  Administered 2014-09-29 (×2): 4 [IU] via SUBCUTANEOUS
  Administered 2014-09-29 – 2014-09-30 (×3): 3 [IU] via SUBCUTANEOUS
  Administered 2014-09-30 (×2): 4 [IU] via SUBCUTANEOUS
  Administered 2014-10-01 – 2014-10-03 (×8): 3 [IU] via SUBCUTANEOUS
  Administered 2014-10-03: 100 [IU] via SUBCUTANEOUS
  Administered 2014-10-03: 4 [IU] via SUBCUTANEOUS
  Administered 2014-10-03 (×3): 3 [IU] via SUBCUTANEOUS
  Administered 2014-10-03: 100 [IU] via SUBCUTANEOUS
  Administered 2014-10-04 – 2014-10-06 (×11): 3 [IU] via SUBCUTANEOUS
  Administered 2014-10-06 (×2): 4 [IU] via SUBCUTANEOUS
  Administered 2014-10-06 – 2014-10-07 (×3): 3 [IU] via SUBCUTANEOUS
  Administered 2014-10-07: 4 [IU] via SUBCUTANEOUS
  Administered 2014-10-07 – 2014-10-11 (×14): 3 [IU] via SUBCUTANEOUS

## 2014-09-24 MED ORDER — SODIUM CHLORIDE 0.9 % IV SOLN
3.0000 g | Freq: Three times a day (TID) | INTRAVENOUS | Status: AC
  Administered 2014-09-24 (×2): 3 g via INTRAVENOUS
  Filled 2014-09-24 (×2): qty 3

## 2014-09-24 MED ORDER — PRISMASOL BGK 4/2.5 32-4-2.5 MEQ/L IV SOLN
INTRAVENOUS | Status: AC
  Filled 2014-09-24 (×2): qty 5000

## 2014-09-24 MED ORDER — AMPICILLIN-SULBACTAM SODIUM 1.5 (1-0.5) G IJ SOLR
1.5000 g | Freq: Two times a day (BID) | INTRAMUSCULAR | Status: DC
  Filled 2014-09-24: qty 1.5

## 2014-09-24 MED ORDER — LABETALOL HCL 5 MG/ML IV SOLN: 10.0000 mg | INTRAVENOUS | Status: DC | PRN

## 2014-09-24 MED ORDER — HYDROCORTISONE NA SUCCINATE PF 100 MG IJ SOLR
50.0000 mg | Freq: Two times a day (BID) | INTRAMUSCULAR | Status: DC
  Administered 2014-09-24 – 2014-09-25 (×2): 50 mg via INTRAVENOUS
  Filled 2014-09-24 (×2): qty 1

## 2014-09-24 NOTE — Progress Notes (Signed)
[place] Progress Note Patient Name: Jamie Fields DOB: [birth year] MRN: [id number]   Date of Service  09/24/2014  HPI/Events of Note  k low outuput goosd  eICU Interventions  ksupp     Intervention Category Intermediate Interventions: Electrolyte abnormality - evaluation and management  Raylene Miyamoto. 09/24/2014, 7:51 PM

## 2014-09-24 NOTE — Progress Notes (Signed)
PULMONARY / CRITICAL CARE MEDICINE   Name: Jamie Fields MRN: [id number] DOB: Dec 06, 1955    ADMISSION DATE:  09/17/2014 CONSULTATION DATE:  09/17/2014   REFERRING MD :  [facility]  CHIEF COMPLAINT:  Respiratory Distress  INITIAL PRESENTATION: 59 y.o. F who was taken to [facility] ED early AM hours 2/22 for respiratory distress.  She was admitted and placed on BiPAP.  Was also found to have acute renal failure, AG metabolic acidosis, Overnight, her acidosis and respiratory status worsened to the point she required intubation.  She was also placed on levophed for persistent shock.  Later in day 2/22 was transferred to [facility] for further management.  STUDIES:  CT Chest 2/22 >> no acute process CT abd / pelv 2/22 >> iliac lymph nodes likely reactive.  No acute process CXR 2/23  >> Persistent mild vascular congestion and mild interstitial edema bilaterally. Superimposed infiltrate right upper lobe and left base cannot be excluded. Echo 2/99 >> Systolic function was normal. EF 60% to 65%. Right Tib/Fib DG >> No evidence of osteo; Mild soft tissue swelling RUQ [place] 2/23 >> Liver echogenicity is diffusely increased and somewhat heterogeneous. Most likely are indicative of hepatic steatosis CT tib/fib Rt >> Negative for osteomyelitis or necrotizing fasciitis. Superficial edema and blistering CXR 2/28 >> No change in bilateral airspace disease opacities representing multifocal pneumonia versus asymmetric edema  SIGNIFICANT EVENTS: 2/22  Admitted at [facility]; respiratory failure-intubated; shock; transferred to [facility] 2/23  On [place]; intubated; Fever, CXR infiltrate = sepsis 2/24  Group A strep, added Clinda; CT neg for nec fas; Tube feeds started 2/26  Hypoxia, brady arrest/ PEA in pm  2/27  CVVHD initiated  2/28  Pt net neg 6L; New CXR infiltrate - Vanc restarted   SUBJECTIVE: Net neg ~ 1/2 L. BP elevated with ventilator dyssynchrony.   VITAL SIGNS: Temp:  [97.3 F (36.3 C)-99.1 F  (37.3 C)] 97.3 F (36.3 C) (02/29 0415) Pulse Rate:  [92-111] 98 (02/29 0600) Resp:  [17-32] 23 (02/29 0600) BP: (123-230)/(58-139) 143/68 mmHg (02/29 0600) SpO2:  [92 %-100 %] 99 % (02/29 0600) Arterial Line BP: (132-227)/(56-88) 195/76 mmHg (02/29 0600) FiO2 (%):  [50 %] 50 % (02/29 0600) Weight:  [238 lb 5.1 oz (108.1 kg)] 238 lb 5.1 oz (108.1 kg) (02/29 0400)   HEMODYNAMICS: CVP:  [8 mmHg] 8 mmHg   VENTILATOR SETTINGS: Vent Mode:  [-] PRVC FiO2 (%):  [50 %] 50 % Set Rate:  [22 bmp] 22 bmp Vt Set:  [430 mL] 430 mL PEEP:  [8 cmH20-10 cmH20] 8 cmH20 Plateau Pressure:  [18 cmH20-26 cmH20] 23 cmH20   INTAKE / OUTPUT:  Intake/Output Summary (Last 24 hours) at 09/24/14 2426 Last data filed at 09/24/14 0600  Gross per 24 hour  Intake   4095 ml  Output  11228 ml  Net  -7133 ml   PHYSICAL EXAMINATION: General: Obese female, in NAD Neuro: Sedated on vent. Rass -4 HEENT: [place]/AT. PERRL, sclerae icteric. Cardiovascular: RRR, no M/R/G.  Lungs: coarse bilaterally,. Noted dyshcony Abdomen: Obese, BS x 4, soft, NT/ND.  Musculoskeletal: No gross deformities, generalized edema improved.  Skin: RLE dressing c/d/i, bulla on R inner thigh ruptured  LABS:  CBC  Recent Labs Lab 09/22/14 0525 09/23/14 0420 09/24/14 0413  WBC 28.7* 40.3* 41.3*  HGB 7.2* 7.6* 7.7*  HCT 21.3* 22.0* 22.1*  PLT 60* 92* 125*   Coag's  Recent Labs Lab 09/17/14 2150 09/20/14 0610 09/21/14 1659  APTT  --  37  --  INR 1.63* 1.30 1.39   BMET  Recent Labs Lab 09/23/14 0420 09/23/14 1730 09/24/14 0413  NA 141 141 139  K 2.6* 3.1* 3.2*  CL 102 104 103  CO2 28 28 31   BUN 113* 100* 96*  CREATININE 2.79* 2.24* 1.98*  GLUCOSE 197* 174* 216*   Electrolytes  Recent Labs Lab 09/22/14 0525  09/23/14 0420 09/23/14 1730 09/24/14 0413  CALCIUM 8.2*  < > 8.0* 8.0* 7.8*  MG 2.3  --  2.2  --  2.3  PHOS 4.9*  < > 4.4 3.5 3.7  < > = values in this interval not displayed.   Sepsis  Markers  Recent Labs Lab 09/17/14 2200  09/20/14 0330 09/21/14 1659 09/21/14 1959  LATICACIDVEN 4.8*  < > 1.7 3.3* 1.4  PROCALCITON 64.94  --   --   --   --   < > = values in this interval not displayed.   ABG  Recent Labs Lab 09/21/14 2108 09/22/14 0500 09/23/14 0410  PHART 7.256* 7.254* 7.312*  PCO2ART 47.5* 47.9* 50.9*  PO2ART 78.3* 79.2* 68.2*   Liver Enzymes  Recent Labs Lab 09/19/14 0327 09/21/14 1659  09/23/14 0420 09/23/14 1730 09/24/14 0413  AST 62* 228*  --  132*  --   --   ALT 61* 219*  --  202*  --   --   ALKPHOS 36* 101  --  176*  --   --   BILITOT 2.3* 2.1*  --  4.5*  --   --   ALBUMIN 1.8* 1.2*  < > 1.5*  1.5* 1.8* 1.7*  < > = values in this interval not displayed. Cardiac Enzymes  Recent Labs Lab 09/18/14 0730 09/19/14 0745 09/21/14 1659  TROPONINI 0.10* 0.09* 0.28*   Glucose  Recent Labs Lab 09/23/14 0657 09/23/14 1115 09/23/14 1457 09/23/14 1956 09/24/14 0019 09/24/14 0415  GLUCAP 189* 178* 159* 181* 156* 195*    Imaging Dg Chest Port 1 View  09/23/2014   CLINICAL DATA:  Acute respiratory failure  EXAM: PORTABLE CHEST - 1 VIEW  COMPARISON:  Radiograph 09/22/2014  FINDINGS: The trachea 2, NG tube, and left and right central venous lines are unchanged. Stable cardiac silhouette. There is diffuse airspace disease greater on the left not changed from prior. No pneumothorax.  IMPRESSION: 1. Stable support apparatus. 2. No change in bilateral airspace disease opacities representing multifocal pneumonia versus asymmetric edema.   Electronically Signed   By: Suzy Bouchard M.D.   On: 09/23/2014 08:54   ASSESSMENT / PLAN:  PULMONARY OETT 2/22 >>> A: Acute hypoxic respiratory failure COPD without evidence of exacerbation ARDS P:   PRVC, lung protective ventilation  Keep plat less 30, Vt @ 7 cc/kg, plat are low, very low, pcxr is improvingesis, need to lower sedation (s/p code), consider slight liberalization TF   DuoNebs /  Albuterol PRN ABG this am after above changes  Volume removal successful pcxr in am  abg in am If to  Peep 5, consider SBT  CARDIOVASCULAR CVL R fem 2/22 >> 2/23 CVL L IJ 2/23 >> Aline L rad 2/23 >> A:  Shock - Septic +/-  Hypovolemic Lactic Acidosis - resolved 2/25 Hx HTN Adrenal Insufficiency - unfortunately received steroids before level, so empirically treat Sinus tachy P:  Goal MAP > 65. Empiric steroids started 2/23 >> reduce today  Hold outpatient atorvastatin. Int HTN, add bb IF CONTINUES TO HAVE htN AFTER TREATMENT PAIN   RENAL A:   Mixed metabolic acidosis -  due to lactate & renal failure Acute renal failure - Baseline Cr unknown, no hydro on CT.  UA, urine na, osm: Granular cast; FENa 0.36 % Rhabdo - improving with IVF resuscitation Hyponatremia - resolved Hypokalemia  Pseudohypocalcemia - corrects to wnl P:   Trend BMP  CVVHD per Renal , SHE IS MAKING URINE,  May be able to use mono lasix KVO IVF. Replace electrolytes as indicated; Replace via IV x 6 runs 2/29  GASTROINTESTINAL A:  Transaminitis - Improving GERD Obesity  Severe protein calorie malnutrition  diarhea P:   Trend LFT's, modest improvement Pantoprazole. Tube feeds started 2/24 + SSI Add prostat 2/28 See ID Send cdiff, if neg, add imodium At risk ischemia, send ldh, lactic acid  HEMATOLOGIC A:   Anemia VTE prophylaxis Thrombocytopenia - improving (after diuresis) P:  Transfuse per ICU guidelines. DVT proph:  SCD's CBC in AM. Add sub q hep  INFECTIOUS A:   Group A Strep Septic Shock P:   BCx2 2/22 >>NG Resp sputum >>> RARE GROUP A STREP (S.PYOGENES) ISOLATED  Levofloxacin 2/23>>>2/24 Clinda 2/24 >> 2/26 Vanc, start date 2/22>>>2/26 ---- Restarted 2/28 >>2/29 Zosyn, start date 2/22>>>2/26 Unasyn 2/26 >> IVIG 2/24 >> 2/25  vanc dc, pcxr better with neg balance  ENDOCRINE A:   R/o rel AI - cortisol 58 but had received dose IV  R/o sick euthryoid - TSH low: 0.258,   T3 Low (37); free T4 low (0.74)  P:   SSI while on tube feeds Solu-Cortef 50 q6hrs, reduce today   NEUROLOGIC A:   Acute metabolic encephalopathy R/o anoxia Hx Depression, Anxiety, Pain P:   Sedation:  Fentanyl gtt / Versed gtt, reduce as we liberalize TV  RASS goal: -2 Severe dyschrony, int vec WUA as tolerated  UDS: Positive for opiods Hold outpatient gabapentin, meclizine, sumatriptan, amitriptyline, duloxetine, MS Contin, percocet, opana ER, sertraline. EEG for subclinical seziures  FAMILY  - Updates: family updated 2/28 am.  They are planning a trip and will be leaving the country on 3/1 depending on patients status.  Will need to discuss possibility of trach prior to leaving.  They are aware she may need this and are agreeable to procedure. Will need further discussion / consent via son.    - Inter-disciplinary family meet or Palliative Care meeting due by:  2/28.   STAFF NOTE: I, Merrie Roof, MD FACP have personally reviewed patient's available data, including medical history, events of note, physical examination and test results as part of my evaluation. I have discussed with resident/NP and other care providers such as pharmacist, RN and RRT. In addition, I personally evaluated patient and elicited key findings of: need to assess her neurostatus as able, to increase 1 cc/kg, rate down, rass to -2, dc paralysis off mar, pcxr much improved with neg 7 liters, can we dc cvvhd?, will d/w renal, dc vanc, eeg, dyschronous on vent on examination, continued unasyn The patient is critically ill with multiple organ systems failure and requires high complexity decision making for assessment and support, frequent evaluation and titration of therapies, application of advanced monitoring technologies and extensive interpretation of multiple databases.   Critical Care Time devoted to patient care services described in this note is35 Minutes. This time reflects time of care of this  signee: Merrie Roof, MD FACP. This critical care time does not reflect procedure time, or teaching time or supervisory time of PA/NP/Med student/Med Resident etc but could involve care discussion time. Rest per NP/medical resident whose note is  outlined above and that I agree with   [name]. Titus Mould, MD, [place] Pgr: [facility] 09/24/2014 8:39 AM

## 2014-09-24 NOTE — Progress Notes (Signed)
[place] Progress Note Patient Name: LEYDI WINSTEAD DOB: 1956/04/19 MRN: [id number]   Date of Service  09/24/2014  HPI/Events of Note  Stool now loose after disipmapction  eICU Interventions  F;exi seal     Intervention Category Minor Interventions: Routine modifications to care plan (e.g. PRN medications for pain, fever)  Raylene Miyamoto. 09/24/2014, 9:10 PM

## 2014-09-24 NOTE — Progress Notes (Signed)
     Subjective:  Patient is intubated and sedated.  Patient is on hemodialysis.    Objective:   VITALS:   Filed Vitals:   09/24/14 0600 09/24/14 0700 09/24/14 0726 09/24/14 0736  BP: 143/68 124/67 153/66   Pulse: 98 95 96   Temp:    98.7 F (37.1 C)  TempSrc:    Axillary  Resp: 23 19 26    Height:      Weight:      SpO2: 99% 99% 100%     patient is intubated and sedated Improved appearance of the right leg, edema and erythema is improving with minimal drainage No signs of infection Compartment is soft  Lab Results  Component Value Date   WBC 41.3* 09/24/2014   HGB 7.7* 09/24/2014   HCT 22.1* 09/24/2014   MCV 75.4* 09/24/2014   PLT 125* 09/24/2014   BMET    Component Value Date/Time   NA 139 09/24/2014 0413   K 3.2* 09/24/2014 0413   CL 103 09/24/2014 0413   CO2 31 09/24/2014 0413   GLUCOSE 216* 09/24/2014 0413   BUN 96* 09/24/2014 0413   CREATININE 1.98* 09/24/2014 0413   CALCIUM 7.8* 09/24/2014 0413   GFRNONAA 26* 09/24/2014 0413   GFRAA 31* 09/24/2014 0413     Assessment/Plan:     Principal Problem:   Acute respiratory failure with hypoxia Active Problems:   Shock   Acute renal failure   Obesity hypoventilation syndrome   Elevated LFTs   Encounter for central line placement   Cellulitis   Endotracheally intubated   Right leg is improving with decreased edema and erythema, blisters draining minimally No signs of infection No indication for debridement at this time Given the improvement of the patient's condition and no indication for surgery at this time, will sign off  Can call with any question or concerns or if her condition changes.  Tandre Conly Lelan Pons 09/24/2014, 8:49 AM [phone]

## 2014-09-24 NOTE — Progress Notes (Signed)
UR Completed.  [phone]  

## 2014-09-24 NOTE — Progress Notes (Signed)
ANTIBIOTIC CONSULT NOTE   Pharmacy Consult for Unasyn Indication: GAS bacteremia, pna  No Known Allergies  Patient Measurements: Height: 5\' 7"  (170.2 cm) Weight: 238 lb 5.1 oz (108.1 kg) IBW/kg (Calculated) : 61.6  Vital Signs: Temp: 98.7 F (37.1 C) (02/29 0736) Temp Source: Axillary (02/29 0736) BP: 153/66 mmHg (02/29 0726) Pulse Rate: 96 (02/29 0726) Intake/Output from previous day: 02/28 0701 - 02/29 0700 In: 4066 [I.V.:926; NG/GT:1790; IV Piggyback:1350] Out: 05397 [Urine:5920] Intake/Output from this shift: Total I/O In: -  Out: 735 [Other:735]  Labs:  Recent Labs  09/22/14 0525  09/23/14 0420 09/23/14 1730 09/24/14 0413  WBC 28.7*  --  40.3*  --  41.3*  HGB 7.2*  --  7.6*  --  7.7*  PLT 60*  --  92*  --  125*  CREATININE 3.60*  < > 2.79* 2.24* 1.98*  < > = values in this interval not displayed. Estimated Creatinine Clearance: 38.7 mL/min (by C-G formula based on Cr of 1.98). No results for input(s): VANCOTROUGH, VANCOPEAK, VANCORANDOM, GENTTROUGH, GENTPEAK, GENTRANDOM, TOBRATROUGH, TOBRAPEAK, TOBRARND, AMIKACINPEAK, AMIKACINTROU, AMIKACIN in the last 72 hours.   Microbiology: Recent Results (from the past 720 hour(s))  MRSA PCR Screening     Status: None   Collection Time: 09/17/14  9:25 PM  Result Value Ref Range Status   MRSA by PCR NEGATIVE NEGATIVE Final    Comment:        The GeneXpert MRSA Assay (FDA approved for NASAL specimens only), is one component of a comprehensive MRSA colonization surveillance program. It is not intended to diagnose MRSA infection nor to guide or monitor treatment for MRSA infections.   Culture, Urine     Status: None   Collection Time: 09/18/14  8:26 AM  Result Value Ref Range Status   Specimen Description URINE, CATHETERIZED  Final   Special Requests Normal  Final   Colony Count NO GROWTH Performed at [facility]   Final   Culture NO GROWTH Performed at [facility]   Final   Report Status  09/19/2014 FINAL  Final  Blood culture (routine x 2)     Status: None   Collection Time: 09/18/14  9:05 AM  Result Value Ref Range Status   Specimen Description BLOOD LEFT HAND  Final   Special Requests BOTTLES DRAWN AEROBIC ONLY [place]  Final   Culture   Final    NO GROWTH 5 DAYS Performed at [facility]    Report Status 09/24/2014 FINAL  Final  Respiratory virus panel (routine influenza)     Status: None   Collection Time: 09/18/14  9:12 AM  Result Value Ref Range Status   Source - RVPAN NOSE  Corrected   Respiratory Syncytial Virus A Negative Negative Final   Respiratory Syncytial Virus B Negative Negative Final   Influenza A Negative Negative Final   Influenza B Negative Negative Final   Parainfluenza 1 Negative Negative Final   Parainfluenza 2 Negative Negative Final   Parainfluenza 3 Negative Negative Final   Metapneumovirus Negative Negative Final   Rhinovirus Negative Negative Final   Adenovirus Negative Negative Final    Comment: (NOTE) Performed At: [facility] [place], [place] [id number] Lindon Romp MD [id number]   Blood culture (routine x 2)     Status: None   Collection Time: 09/18/14 10:30 AM  Result Value Ref Range Status   Specimen Description BLOOD LEFT CENTRAL LINE  Final   Special Requests BOTTLES DRAWN AEROBIC AND  ANAEROBIC 10CC  Final   Culture   Final    NO GROWTH 5 DAYS Performed at [facility]    Report Status 09/24/2014 FINAL  Final  Culture, respiratory (NON-Expectorated)     Status: None   Collection Time: 09/18/14 11:34 AM  Result Value Ref Range Status   Specimen Description TRACHEAL ASPIRATE  Final   Special Requests NONE  Final   Gram Stain   Final    ABUNDANT WBC PRESENT,BOTH PMN AND MONONUCLEAR RARE SQUAMOUS EPITHELIAL CELLS PRESENT RARE YEAST Performed at [facility]    Culture   Final    FEW YEAST CONSISTENT WITH CANDIDA SPECIES RARE GROUP A STREP (S.PYOGENES)  ISOLATED Performed at [facility]    Report Status 09/20/2014 FINAL  Final  Blood culture (routine x 2)     Status: None (Preliminary result)   Collection Time: 09/19/14  5:00 PM  Result Value Ref Range Status   Specimen Description BLOOD RIGHT ARM  Final   Special Requests BOTTLES DRAWN AEROBIC ONLY 10CC  Final   Culture   Final           BLOOD CULTURE RECEIVED NO GROWTH TO DATE CULTURE WILL BE HELD FOR 5 DAYS BEFORE ISSUING A FINAL NEGATIVE REPORT Performed at [facility]    Report Status PENDING  Incomplete  Blood culture (routine x 2)     Status: None (Preliminary result)   Collection Time: 09/19/14  5:20 PM  Result Value Ref Range Status   Specimen Description BLOOD RIGHT HAND  Final   Special Requests BOTTLES DRAWN AEROBIC ONLY 5CC  Final   Culture   Final           BLOOD CULTURE RECEIVED NO GROWTH TO DATE CULTURE WILL BE HELD FOR 5 DAYS BEFORE ISSUING A FINAL NEGATIVE REPORT Performed at [facility]    Report Status PENDING  Incomplete  Culture, respiratory (NON-Expectorated)     Status: None (Preliminary result)   Collection Time: 09/23/14 11:33 AM  Result Value Ref Range Status   Specimen Description ENDOTRACHEAL  Final   Special Requests NONE  Final   Gram Stain PENDING  Incomplete   Culture   Final    Culture reincubated for better growth Performed at [facility]    Report Status PENDING  Incomplete    Assessment: 59 y.o. female continues on Unasyn (total abx D#7) for GAS in blood at [place] and GAS in trach asp here also. Pt is afebrile and WBC is elevated at 41.3. Scr is trending down. Transitioning off of CRRT tonight but continuing until it clots off per RN (likely to be around 8pm)  2/22 Vanc >>2/26; 2/28>>2/29 2/22 Zosyn >>2/26 2/22 Levaquin >>2/24 2/23 Tamiflu >>2/24 2/24 Clindamycin >>2/26 Unasyn 2/26>>  2/23 MRSA PCR neg 2/23 Flu neg 2/23 Urine >>neg 2/23 TA >> few candida albicans; rare GAS 2/23 Blood >>  ngtd [place] blood >> 1/2 GAS (do not conduct sensirivities)  Goal of Therapy:  Resolution of infection   Plan:  1. Continue Unasyn 3gm IV q8h today then start 1.5mg  IV Q12H starting tomorrow 2. F/u renal plans, C&S, clinical status and LOT  Salome Arnt, PharmD, BCPS Pager # [phone] 09/24/2014 11:29 AM

## 2014-09-24 NOTE — Progress Notes (Signed)
Patient ID: Jamie Fields, female   DOB: Nov 04, 1955, 59 y.o.   MRN: [id number] S:Intubated/sedated O:BP 153/66 mmHg  Pulse 96  Temp(Src) 98.7 F (37.1 C) (Axillary)  Resp 26  Ht 5\' 7"  (1.702 m)  Wt 108.1 kg (238 lb 5.1 oz)  BMI 37.32 kg/m2  SpO2 100%  Intake/Output Summary (Last 24 hours) at 09/24/14 1009 Last data filed at 09/24/14 0900  Gross per 24 hour  Intake   3610 ml  Output  10609 ml  Net  -6999 ml   Intake/Output: I/O last 3 completed shifts: In: 5921.3 [I.V.:1321.3; NG/GT:2810; IV Piggyback:1790] Out: 44818 [HUDJS:9702; Other:7460]  Intake/Output this shift:  Total I/O In: -  Out: 486 [Other:486] Weight change: -7.9 kg (-17 lb 6.7 oz) Gen:WD obese WF intubated CVS:no rub Resp:occ rhonchi OVZ:CHYIF, soft Ext:+edema of extremities   Recent Labs Lab 09/17/14 2150 09/18/14 0500  09/19/14 0327  09/21/14 0505 09/21/14 1659 09/22/14 0525 09/22/14 1730 09/23/14 0420 09/23/14 1730 09/24/14 0413  NA 134* 135  < >  --   < > 132* 135 138 141 141 141 139  K 4.4 4.2  < >  --   < > 3.4* 4.4 3.2* 3.1* 2.6* 3.1* 3.2*  CL 103 103  < >  --   < > 105 106 105 105 102 104 103  CO2 19 19  < >  --   < > 20 20 25 24 28 28 31   GLUCOSE 198* 184*  < >  --   < > 193* 347* 167* 167* 197* 174* 216*  BUN 50* 54*  < >  --   < > 99* 107* 125* 132* 113* 100* 96*  CREATININE 3.63* 3.76*  < >  --   < > 3.27* 3.52* 3.60* 3.49* 2.79* 2.24* 1.98*  ALBUMIN 2.0* 2.1*  --  1.8*  --   --  1.2*  --  1.4* 1.5*  1.5* 1.8* 1.7*  CALCIUM 6.8* 6.6*  < >  --   < > 7.5* 9.7 8.2* 8.3* 8.0* 8.0* 7.8*  PHOS 4.4  --   --   --   --  5.1*  --  4.9* 5.5* 4.4 3.5 3.7  AST 123* 113*  --  62*  --   --  228*  --   --  132*  --   --   ALT 86* 83*  --  61*  --   --  219*  --   --  202*  --   --   < > = values in this interval not displayed. Liver Function Tests:  Recent Labs Lab 09/19/14 0327 09/21/14 1659  09/23/14 0420 09/23/14 1730 09/24/14 0413  AST 62* 228*  --  132*  --   --   ALT 61* 219*  --   202*  --   --   ALKPHOS 36* 101  --  176*  --   --   BILITOT 2.3* 2.1*  --  4.5*  --   --   PROT 4.3* 6.2  --  6.8  --   --   ALBUMIN 1.8* 1.2*  < > 1.5*  1.5* 1.8* 1.7*  < > = values in this interval not displayed.  Recent Labs Lab 09/18/14 0500 09/19/14 0327  LIPASE 13 13  AMYLASE  --  18   No results for input(s): AMMONIA in the last 168 hours. CBC:  Recent Labs Lab 09/20/14 0330  09/21/14 0505 09/21/14 1659 09/22/14 0525 09/23/14 0277  09/24/14 0413  WBC 13.5*  --  14.3* 33.6* 28.7* 40.3* 41.3*  NEUTROABS 11.5*  --   --   --   --   --   --   HGB 7.4*  --  7.3* 7.8* 7.2* 7.6* 7.7*  HCT 22.0*  --  21.8* 23.4* 21.3* 22.0* 22.1*  MCV 78.0  --  79.3 79.1 77.7* 76.1* 75.4*  PLT 18*  < > 29* 53* 60* 92* 125*  < > = values in this interval not displayed. Cardiac Enzymes:  Recent Labs Lab 09/17/14 2150 09/18/14 0730 09/19/14 0745 09/21/14 1659  CKTOTAL 1482*  --   --   --   TROPONINI  --  0.10* 0.09* 0.28*   CBG:  Recent Labs Lab 09/23/14 1457 09/23/14 1956 09/24/14 0019 09/24/14 0415 09/24/14 0735  GLUCAP 159* 181* 156* 195* 190*    Iron Studies: No results for input(s): IRON, TIBC, TRANSFERRIN, FERRITIN in the last 72 hours. Studies/Results: Dg Chest Port 1 View  09/24/2014   CLINICAL DATA:  Respiratory failure.  EXAM: PORTABLE CHEST - 1 VIEW  COMPARISON:  09/15/2014.  FINDINGS: Endotracheal tube, NG tube, bilateral NG tubes in stable position. Stable cardiomegaly with normal pulmonary vascularity. Interim partial clearing of bilateral pulmonary infiltrates. No pleural effusion or pneumothorax. No acute osseous abnormality.  IMPRESSION: 1. Lines and tubes in stable position. 2. Interim partial clearing of bilateral pulmonary infiltrates.   Electronically Signed   By: Marcello Moores  Register   On: 09/24/2014 07:10   Dg Chest Port 1 View  09/23/2014   CLINICAL DATA:  Acute respiratory failure  EXAM: PORTABLE CHEST - 1 VIEW  COMPARISON:  Radiograph 09/22/2014   FINDINGS: The trachea 2, NG tube, and left and right central venous lines are unchanged. Stable cardiac silhouette. There is diffuse airspace disease greater on the left not changed from prior. No pneumothorax.  IMPRESSION: 1. Stable support apparatus. 2. No change in bilateral airspace disease opacities representing multifocal pneumonia versus asymmetric edema.   Electronically Signed   By: Suzy Bouchard M.D.   On: 09/23/2014 08:54   Dg Chest Port 1 View  09/22/2014   CLINICAL DATA:  Acute respiratory failure, check HD catheter placement  EXAM: PORTABLE CHEST - 1 VIEW  COMPARISON:  09/22/2014  FINDINGS: Multifocal patchy opacities in the left lung and right upper lobe, suspicious for multifocal pneumonia.  No pleural effusion or pneumothorax.  Endotracheal tube terminates 4.5 cm above the carina.  Left IJ venous catheter terminates in the SVC. Right IJ hemodialysis catheter terminates in the mid SVC.  IMPRESSION: Right IJ hemodialysis catheter terminates in the mid SVC.  Endotracheal tube terminates 4.5 cm above the carina.  Multifocal patchy opacities in the left lung and right upper lobe, suspicious for multifocal pneumonia.   Electronically Signed   By: Julian Hy M.D.   On: 09/22/2014 15:59   . ampicillin-sulbactam (UNASYN) IV  3 g Intravenous 3 times per day  . antiseptic oral rinse  7 mL Mouth Rinse QID  . chlorhexidine  15 mL Mouth Rinse BID  . feeding supplement (PRO-STAT SUGAR FREE 64)  30 mL Oral BID  . heparin subcutaneous  5,000 Units Subcutaneous 3 times per day  . hydrocortisone sod succinate (SOLU-CORTEF) inj  50 mg Intravenous Q12H  . insulin aspart  0-20 Units Subcutaneous 6 times per day  . ipratropium-albuterol  3 mL Nebulization Q6H  . pantoprazole sodium  40 mg Per Tube Daily  . potassium chloride  10 mEq Intravenous Q1  Hr x 6    BMET    Component Value Date/Time   NA 139 09/24/2014 0413   K 3.2* 09/24/2014 0413   CL 103 09/24/2014 0413   CO2 31 09/24/2014 0413    GLUCOSE 216* 09/24/2014 0413   BUN 96* 09/24/2014 0413   CREATININE 1.98* 09/24/2014 0413   CALCIUM 7.8* 09/24/2014 0413   GFRNONAA 26* 09/24/2014 0413   GFRAA 31* 09/24/2014 0413   CBC    Component Value Date/Time   WBC 41.3* 09/24/2014 0413   RBC 2.93* 09/24/2014 0413   HGB 7.7* 09/24/2014 0413   HCT 22.1* 09/24/2014 0413   PLT 125* 09/24/2014 0413   MCV 75.4* 09/24/2014 0413   MCH 26.3 09/24/2014 0413   MCHC 34.8 09/24/2014 0413   RDW 16.2* 09/24/2014 0413   LYMPHSABS 0.9 09/20/2014 0330   MONOABS 1.1* 09/20/2014 0330   EOSABS 0.0 09/20/2014 0330   BASOSABS 0.0 09/20/2014 0330     Assessment/Plan:  1. ARF in setting of sepsis and need for pressors- currently on CVVHD with marked increase in UOP.  BP's have improved and she is off pressors, with negative 7 liters over the last 24 hours and 13 liters over the last 48 hrs.  Will hold cvvhd today and follow UOP and daily labs as she may require IHD. 2. VDRF- per PCCM 3. Group A strep sepsis- on antibiotics and off pressors.  Also with erythema of right femoral area, CT scan was negative for necrotizing fasciitis.  Cont with antibiotics and supportive care. 4. Volume overload- as above, marked diuresis, ok to hold CVVHD for now and follow. 5. Anemia- transfuse prn. 6. Protein malnutrition.  [place] [facility]

## 2014-09-25 ENCOUNTER — Inpatient Hospital Stay (HOSPITAL_COMMUNITY): Payer: Commercial Managed Care - HMO

## 2014-09-25 ENCOUNTER — Encounter (HOSPITAL_COMMUNITY): Payer: Self-pay | Admitting: Neurology

## 2014-09-25 DIAGNOSIS — R401 Stupor: Secondary | ICD-10-CM

## 2014-09-25 DIAGNOSIS — R4182 Altered mental status, unspecified: Secondary | ICD-10-CM | POA: Insufficient documentation

## 2014-09-25 DIAGNOSIS — I469 Cardiac arrest, cause unspecified: Secondary | ICD-10-CM

## 2014-09-25 DIAGNOSIS — J80 Acute respiratory distress syndrome: Secondary | ICD-10-CM

## 2014-09-25 DIAGNOSIS — G9340 Encephalopathy, unspecified: Secondary | ICD-10-CM

## 2014-09-25 LAB — BLOOD GAS, ARTERIAL
ACID-BASE DEFICIT: 8.6 mmol/L — AB (ref 0.0–2.0)
Acid-Base Excess: 2.1 mmol/L — ABNORMAL HIGH (ref 0.0–2.0)
Bicarbonate: 18.4 mEq/L — ABNORMAL LOW (ref 20.0–24.0)
Bicarbonate: 26.5 mEq/L — ABNORMAL HIGH (ref 20.0–24.0)
Drawn by: 41308
Drawn by: 252031
FIO2: 0.6 %
FIO2: 0.4 %
LHR: 18 {breaths}/min
MECHVT: 490 mL
O2 Saturation: 92.4 %
O2 Saturation: 96.9 %
PATIENT TEMPERATURE: 98.6
PEEP: 10 cmH2O
PEEP: 5 cmH2O
PH ART: 7.176 — AB (ref 7.350–7.450)
PO2 ART: 97.2 mmHg (ref 80.0–100.0)
Patient temperature: 98.6
RATE: 35 resp/min
TCO2: 20 mmol/L (ref 0–100)
TCO2: 27.8 mmol/L (ref 0–100)
VT: 370 mL
pCO2 arterial: 51.7 mmHg — ABNORMAL HIGH (ref 35.0–45.0)
pCO2 arterial: 43.5 mmHg (ref 35.0–45.0)
pH, Arterial: 7.401 (ref 7.350–7.450)
pO2, Arterial: 73.4 mmHg — ABNORMAL LOW (ref 80.0–100.0)

## 2014-09-25 LAB — OSMOLALITY, URINE: Osmolality, Ur: 462 mOsm/kg (ref 390–1090)

## 2014-09-25 LAB — HEPATIC FUNCTION PANEL
ALK PHOS: 414 U/L — AB (ref 39–117)
ALT: 145 U/L — ABNORMAL HIGH (ref 0–35)
AST: 87 U/L — ABNORMAL HIGH (ref 0–37)
Albumin: 1.8 g/dL — ABNORMAL LOW (ref 3.5–5.2)
BILIRUBIN TOTAL: 2.3 mg/dL — AB (ref 0.3–1.2)
Bilirubin, Direct: 1.3 mg/dL — ABNORMAL HIGH (ref 0.0–0.5)
Indirect Bilirubin: 1 mg/dL — ABNORMAL HIGH (ref 0.3–0.9)
Total Protein: 6.8 g/dL (ref 6.0–8.3)

## 2014-09-25 LAB — GLUCOSE, CAPILLARY
GLUCOSE-CAPILLARY: 200 mg/dL — AB (ref 70–99)
Glucose-Capillary: 229 mg/dL — ABNORMAL HIGH (ref 70–99)
Glucose-Capillary: 150 mg/dL — ABNORMAL HIGH (ref 70–99)
Glucose-Capillary: 149 mg/dL — ABNORMAL HIGH (ref 70–99)
Glucose-Capillary: 139 mg/dL — ABNORMAL HIGH (ref 70–99)
Glucose-Capillary: 162 mg/dL — ABNORMAL HIGH (ref 70–99)

## 2014-09-25 LAB — CULTURE, RESPIRATORY W GRAM STAIN

## 2014-09-25 LAB — RENAL FUNCTION PANEL
ALBUMIN: 1.8 g/dL — AB (ref 3.5–5.2)
Anion gap: 10 (ref 5–15)
BUN: 103 mg/dL — ABNORMAL HIGH (ref 6–23)
CALCIUM: 8.3 mg/dL — AB (ref 8.4–10.5)
CO2: 28 mmol/L (ref 19–32)
Chloride: 106 mmol/L (ref 96–112)
Creatinine, Ser: 1.88 mg/dL — ABNORMAL HIGH (ref 0.50–1.10)
GFR calc Af Amer: 33 mL/min — ABNORMAL LOW (ref >90)
GFR calc non Af Amer: 28 mL/min — ABNORMAL LOW (ref >90)
Glucose, Bld: 192 mg/dL — ABNORMAL HIGH (ref 70–99)
POTASSIUM: 3.7 mmol/L (ref 3.5–5.1)
Phosphorus: 4.7 mg/dL — ABNORMAL HIGH (ref 2.3–4.6)
Sodium: 144 mmol/L (ref 135–145)

## 2014-09-25 LAB — CBC
HEMATOCRIT: 22.6 % — AB (ref 36.0–46.0)
Hemoglobin: 7.6 g/dL — ABNORMAL LOW (ref 12.0–15.0)
MCH: 25.9 pg — AB (ref 26.0–34.0)
MCHC: 33.6 g/dL (ref 30.0–36.0)
MCV: 77.1 fL — AB (ref 78.0–100.0)
PLATELETS: 151 10*3/uL (ref 150–400)
RBC: 2.93 MIL/uL — AB (ref 3.87–5.11)
RDW: 16.4 % — ABNORMAL HIGH (ref 11.5–15.5)
WBC: 39.2 10*3/uL — ABNORMAL HIGH (ref 4.0–10.5)

## 2014-09-25 LAB — CULTURE, RESPIRATORY

## 2014-09-25 LAB — MAGNESIUM: Magnesium: 2.1 mg/dL (ref 1.5–2.5)

## 2014-09-25 LAB — AMMONIA: Ammonia: 47 umol/L — ABNORMAL HIGH (ref 11–32)

## 2014-09-25 MED ORDER — LABETALOL HCL 200 MG PO TABS
200.0000 mg | ORAL_TABLET | Freq: Two times a day (BID) | ORAL | Status: DC
  Administered 2014-09-25 – 2014-10-08 (×26): 200 mg
  Filled 2014-09-25 (×29): qty 1

## 2014-09-25 MED ORDER — LOPERAMIDE HCL 1 MG/5ML PO LIQD
4.0000 mg | ORAL | Status: DC | PRN
  Administered 2014-09-26 – 2014-09-28 (×4): 4 mg via ORAL
  Filled 2014-09-25 (×7): qty 20

## 2014-09-25 MED ORDER — FREE WATER
250.0000 mL | Status: DC
  Administered 2014-09-25 – 2014-09-30 (×29): 250 mL

## 2014-09-25 MED ORDER — LABETALOL HCL 5 MG/ML IV SOLN
10.0000 mg | INTRAVENOUS | Status: DC | PRN
  Filled 2014-09-25: qty 4

## 2014-09-25 MED ORDER — PROPOFOL 10 MG/ML IV EMUL
5.0000 ug/kg/min | INTRAVENOUS | Status: DC
  Administered 2014-09-25: 5 ug/kg/min via INTRAVENOUS
  Administered 2014-09-27: 10 ug/kg/min via INTRAVENOUS
  Filled 2014-09-25 (×2): qty 100

## 2014-09-25 MED ORDER — MIDAZOLAM HCL 2 MG/2ML IJ SOLN
1.0000 mg | Freq: Four times a day (QID) | INTRAMUSCULAR | Status: DC
  Administered 2014-09-25: 1 mg via INTRAVENOUS
  Filled 2014-09-25: qty 2

## 2014-09-25 NOTE — Procedures (Signed)
ELECTROENCEPHALOGRAM REPORT  Patient: Jamie Fields       Room #: 7K82 EEG No. ID: [id number] Age: 59 y.o.        Sex: female Referring Physician: Lamonte Sakai, R Report Date:  09/25/2014        Interpreting Physician: Anthony Sar  History: [name] is an 59 y.o. female with respiratory failure as well as kidney failure on dialysis and persistent metabolic encephalopathy and unresponsiveness.  Indications for study:  Assess severity of encephalopathy; rule out subclinical seizure activity.  Technique: This is an 18 channel routine scalp EEG performed at the bedside with bipolar and monopolar montages arranged in accordance to the international 10/20 system of electrode placement.   Description: Patient was noted to be intubated and on mechanical ventilation as well as unresponsive, including to noxious stimuli during the study. Predominant background activity consisted of low amplitude 1-2 Hz diffuse irregular delta activity with intermittent brief occurrences of moderate amplitude, somewhat rhythmic delta. Photic stimulation was not performed. No epileptiform discharges were recorded.  Interpretation: This EEG is abnormal with severe localized continuous slowing of cerebral activity consistent with severe diffuse encephalopathy. This pattern of slowing can be seen with hypoxic encephalopathy as well as with metabolic encephalopathy. No evidence of seizure activity was demonstrated.   Rush Farmer M.D. [facility] [phone]

## 2014-09-25 NOTE — Progress Notes (Signed)
EEG at bedside completed, results pending.

## 2014-09-25 NOTE — Procedures (Signed)
I have had extensive discussions with family son. We discussed patients current circumstances and organ failures. We also discussed patient's prior wishes under circumstances such as this. Family has decided to NOT perform resuscitation if arrest but to continue current medical support for now.  [name]. Titus Mould, MD, [place] Pgr: [facility]

## 2014-09-25 NOTE — Consult Note (Signed)
NEURO HOSPITALIST CONSULT NOTE    Reason for Consult: Prognosis after brief CPR  HPI:                                                                                                                                         Patient is unable to provide and much of HPI obtained from chart.    Jamie Fields is an 59 y.o. female PMH of HTN, Asthma, COPD, GERD, Arthritis, Depression, Anxiety, Pain. She was in her USOH until early AM hours of 2/22 when she was taken to [facility] ED for N/D, decreased PO intake, SOB, subjective fevers, myalgias. She was admitted for hypoxic respiratory failure and septic shock of unclear etiology. Overnight, her respiratory status and acidosis worsened to the point that she required intubation. She was later transferred to [facility] for further management. Patient was found to have blood cultures positive for group A strep. On 2/26 patients HR decreased to 30's and Code Blue was called. Bicarb and Epi administered and pulse regained after 4 minutes. Patient was started on CRRT. Since event on 2/26 she has remained sedated. Neurology was called to evaluate for prognostication.   Most recent labs  Na 144 K+ 3.7 Cr. 1.88 and GFR 28 Phos 4.7 Ca 8.3 LFT panel pending WBC 39.2  PO2 97.2 TSH 0.258  Past Medical History  Diagnosis Date  . COPD (chronic obstructive pulmonary disease)   . GERD (gastroesophageal reflux disease)   . Asthma   . HTN (hypertension)   . Depression   . Anxiety     No past surgical history on file.  Family History  Problem Relation Age of Onset  . Hyperlipidemia Mother   . Hypertension Father      Social History:  has no tobacco, alcohol, and drug history on file.  No Known Allergies  MEDICATIONS:                                                                                                                     Prior to Admission:  Prescriptions prior to admission  Medication Sig Dispense Refill Last Dose   . albuterol (PROVENTIL) (2.5 MG/3ML) 0.083% nebulizer solution Take 2.5 mg by nebulization every 4 (four) hours as needed for wheezing or shortness of breath.  unknown  . amitriptyline (ELAVIL) 25 MG tablet Take 50 mg by mouth at bedtime.    unknown  . atorvastatin (LIPITOR) 10 MG tablet Take 10 mg by mouth at bedtime.    unknown  . diclofenac (VOLTAREN) 75 MG EC tablet Take 75 mg by mouth 2 (two) times daily as needed (pain).    unknown  . DULoxetine (CYMBALTA) 30 MG capsule Take 60 mg by mouth daily.    unknown  . esomeprazole (NEXIUM) 40 MG capsule Take 40 mg by mouth daily.    unknown  . gabapentin (NEURONTIN) 800 MG tablet Take 800 mg by mouth 3 (three) times daily.    unknown  . ibuprofen (ADVIL,MOTRIN) 200 MG tablet Take 200 mg by mouth every 4 (four) hours as needed for fever (pain).   unknown  . Isopropyl Alcohol 95 % LIQD Place 4-5 drops in ear(s) as needed (water in ears).   unknown  . meclizine (ANTIVERT) 25 MG tablet Take 25 mg by mouth daily.    unknown  . morphine (MS CONTIN) 15 MG 12 hr tablet Take 15 mg by mouth every 12 (twelve) hours.    unknown  . ondansetron (ZOFRAN) 8 MG tablet Take 8 mg by mouth 3 (three) times daily as needed for nausea or vomiting.   unknown  . OPANA ER, CRUSH RESISTANT, 10 MG T12A 12 hr tablet Take 10 mg by mouth 2 (two) times daily.    unknown  . oxyCODONE-acetaminophen (PERCOCET) 10-325 MG per tablet Take 1 tablet by mouth every 6 (six) hours as needed for pain.    unknown  . oxymetazoline (AFRIN) 0.05 % nasal spray Place 1 spray into both nostrils 2 (two) times daily.   unknown  . phenylephrine (NEO-SYNEPHRINE) 0.5 % nasal solution Place 2 drops into both nostrils every 12 (twelve) hours as needed for congestion.   unknown  . Phenylephrine-Guaifenesin 5-200 MG TABS Take 2 tablets by mouth every 4 (four) hours as needed (congestion/cough).   unknown  . sertraline (ZOLOFT) 100 MG tablet Take 200 mg by mouth daily.    unknown  . SUMAtriptan (IMITREX)  50 MG tablet Take 50 mg by mouth every 2 (two) hours as needed for migraine. Do not exceed 200 mg in 24 hours   unknown  . tamsulosin (FLOMAX) 0.4 MG CAPS capsule Take 0.4 mg by mouth daily. 30 minutes after the same meal every day   unknown  . traMADol (ULTRAM) 50 MG tablet Take 50-100 mg by mouth 2 (two) times daily. Alternate with oxycodone - do not take together   unknown   Scheduled: . ampicillin-sulbactam (UNASYN) IV  1.5 g Intravenous Q12H  . antiseptic oral rinse  7 mL Mouth Rinse QID  . chlorhexidine  15 mL Mouth Rinse BID  . feeding supplement (PRO-STAT SUGAR FREE 64)  30 mL Oral BID  . free water  250 mL Per Tube Q4H  . heparin subcutaneous  5,000 Units Subcutaneous 3 times per day  . insulin aspart  0-20 Units Subcutaneous 6 times per day  . ipratropium-albuterol  3 mL Nebulization Q6H  . labetalol  200 mg Per Tube BID  . midazolam  1 mg Intravenous Q6H  . pantoprazole sodium  40 mg Per Tube Daily     ROS:  History obtained from unobtainable from patient due to mental status    Blood pressure 198/79, pulse 112, temperature 97.9 F (36.6 C), temperature source Oral, resp. rate 23, height 5\' 7"  (1.702 m), weight 103.6 kg (228 lb 6.3 oz), SpO2 100 %.   Neurologic Examination:                                                                                                      HEENT-  Normocephalic, no lesions, without obvious abnormality.  Normal external eye and conjunctiva.  Normal TM's bilaterally.  Normal auditory canals and external ears. Normal external nose, mucus membranes and septum.  Normal pharynx. Cardiovascular- S1, S2 normal, pulses palpable throughout   Lungs- chest clear, no wheezing, rales, normal symmetric air entry Abdomen- normal findings: bowel sounds normal Extremities- mild LE edema Lymph-no adenopathy  palpable Musculoskeletal-no joint tenderness, deformity or swelling Skin-warm and dry, no hyperpigmentation, vitiligo, or suspicious lesions  Neurological Examination Mental Status: Patient does not respond to verbal stimuli.  Does not respond to deep sternal rub.  Does not follow commands.  No verbalizations noted. Intubated breathing over the vent Cranial Nerves: II: patient does not respond confrontation bilaterally, pupils right 2 mm, left 2 mm,and reactive bilaterally III,IV,VI: doll's response present bilaterally. V,VII: corneal reflex present bilaterally  VIII: patient does not respond to verbal stimuli IX,X: gag reflex present, XI: trapezius strength unable to test bilaterally XII: tongue strength unable to test Motor: Extremities flaccid throughout.  No spontaneous movement noted.  No purposeful movements noted. Sensory: Does not respond to noxious stimuli in any extremity. Deep Tendon Reflexes:  Absent throughout. Plantars: mute bilaterally  Cerebellar: Unable to perform      Lab Results: Basic Metabolic Panel:  Recent Labs Lab 09/21/14 0505  09/22/14 0525  09/23/14 0420 09/23/14 1730 09/24/14 0413 09/24/14 1715 09/25/14 0428  NA 132*  < > 138  < > 141 141 139 139 144  K 3.4*  < > 3.2*  < > 2.6* 3.1* 3.2* 3.1* 3.7  CL 105  < > 105  < > 102 104 103 105 106  CO2 20  < > 25  < > 28 28 31 27 28   GLUCOSE 193*  < > 167*  < > 197* 174* 216* 188* 192*  BUN 99*  < > 125*  < > 113* 100* 96* 87* 103*  CREATININE 3.27*  < > 3.60*  < > 2.79* 2.24* 1.98* 1.67* 1.88*  CALCIUM 7.5*  < > 8.2*  < > 8.0* 8.0* 7.8* 8.0* 8.3*  MG 2.3  --  2.3  --  2.2  --  2.3  --  2.1  PHOS 5.1*  --  4.9*  < > 4.4 3.5 3.7 3.0 4.7*  < > = values in this interval not displayed.  Liver Function Tests:  Recent Labs Lab 09/19/14 0327 09/21/14 1659  09/23/14 0420 09/23/14 1730 09/24/14 0413 09/24/14 1715 09/25/14 0428  AST 62* 228*  --  132*  --   --   --   --   ALT 61* 219*  --  202*   --   --   --   --   ALKPHOS 36* 101  --  176*  --   --   --   --   BILITOT 2.3* 2.1*  --  4.5*  --   --   --   --   PROT 4.3* 6.2  --  6.8  --   --   --   --   ALBUMIN 1.8* 1.2*  < > 1.5*  1.5* 1.8* 1.7* 1.9* 1.8*  < > = values in this interval not displayed.  Recent Labs Lab 09/19/14 0327  LIPASE 13  AMYLASE 18   No results for input(s): AMMONIA in the last 168 hours.  CBC:  Recent Labs Lab 09/20/14 0330  09/21/14 1659 09/22/14 0525 09/23/14 0420 09/24/14 0413 09/25/14 0428  WBC 13.5*  < > 33.6* 28.7* 40.3* 41.3* 39.2*  NEUTROABS 11.5*  --   --   --   --   --   --   HGB 7.4*  < > 7.8* 7.2* 7.6* 7.7* 7.6*  HCT 22.0*  < > 23.4* 21.3* 22.0* 22.1* 22.6*  MCV 78.0  < > 79.1 77.7* 76.1* 75.4* 77.1*  PLT 18*  < > 53* 60* 92* 125* 151  < > = values in this interval not displayed.  Cardiac Enzymes:  Recent Labs Lab 09/19/14 0745 09/21/14 1659  TROPONINI 0.09* 0.28*    Lipid Panel: No results for input(s): CHOL, TRIG, HDL, CHOLHDL, VLDL, LDLCALC in the last 168 hours.  CBG:  Recent Labs Lab 09/24/14 1539 09/24/14 1901 09/24/14 2322 09/25/14 0347 09/25/14 0732  GLUCAP 160* 131* 229* 200* 150*    Microbiology: Results for orders placed or performed during the hospital encounter of 09/17/14  MRSA PCR Screening     Status: None   Collection Time: 09/17/14  9:25 PM  Result Value Ref Range Status   MRSA by PCR NEGATIVE NEGATIVE Final    Comment:        The GeneXpert MRSA Assay (FDA approved for NASAL specimens only), is one component of a comprehensive MRSA colonization surveillance program. It is not intended to diagnose MRSA infection nor to guide or monitor treatment for MRSA infections.   Culture, Urine     Status: None   Collection Time: 09/18/14  8:26 AM  Result Value Ref Range Status   Specimen Description URINE, CATHETERIZED  Final   Special Requests Normal  Final   Colony Count NO GROWTH Performed at [facility]   Final   Culture  NO GROWTH Performed at [facility]   Final   Report Status 09/19/2014 FINAL  Final  Blood culture (routine x 2)     Status: None   Collection Time: 09/18/14  9:05 AM  Result Value Ref Range Status   Specimen Description BLOOD LEFT HAND  Final   Special Requests BOTTLES DRAWN AEROBIC ONLY 2CC  Final   Culture   Final    NO GROWTH 5 DAYS Performed at [facility]    Report Status 09/24/2014 FINAL  Final  Respiratory virus panel (routine influenza)     Status: None   Collection Time: 09/18/14  9:12 AM  Result Value Ref Range Status   Source - RVPAN NOSE  Corrected   Respiratory Syncytial Virus A Negative Negative Final   Respiratory Syncytial Virus B Negative Negative Final   Influenza A Negative Negative Final   Influenza B Negative Negative Final   Parainfluenza 1 Negative  Negative Final   Parainfluenza 2 Negative Negative Final   Parainfluenza 3 Negative Negative Final   Metapneumovirus Negative Negative Final   Rhinovirus Negative Negative Final   Adenovirus Negative Negative Final    Comment: (NOTE) Performed At: [facility] [place], [place] [id number] Lindon Romp MD KC:[id number]   Blood culture (routine x 2)     Status: None   Collection Time: 09/18/14 10:30 AM  Result Value Ref Range Status   Specimen Description BLOOD LEFT CENTRAL LINE  Final   Special Requests BOTTLES DRAWN AEROBIC AND ANAEROBIC 10CC  Final   Culture   Final    NO GROWTH 5 DAYS Performed at [facility]    Report Status 09/24/2014 FINAL  Final  Culture, respiratory (NON-Expectorated)     Status: None   Collection Time: 09/18/14 11:34 AM  Result Value Ref Range Status   Specimen Description TRACHEAL ASPIRATE  Final   Special Requests NONE  Final   Gram Stain   Final    ABUNDANT WBC PRESENT,BOTH PMN AND MONONUCLEAR RARE SQUAMOUS EPITHELIAL CELLS PRESENT RARE YEAST Performed at [facility]    Culture   Final    FEW YEAST  CONSISTENT WITH CANDIDA SPECIES RARE GROUP A STREP (S.PYOGENES) ISOLATED Performed at [facility]    Report Status 09/20/2014 FINAL  Final  Blood culture (routine x 2)     Status: None (Preliminary result)   Collection Time: 09/19/14  5:00 PM  Result Value Ref Range Status   Specimen Description BLOOD RIGHT ARM  Final   Special Requests BOTTLES DRAWN AEROBIC ONLY 10CC  Final   Culture   Final           BLOOD CULTURE RECEIVED NO GROWTH TO DATE CULTURE WILL BE HELD FOR 5 DAYS BEFORE ISSUING A FINAL NEGATIVE REPORT Performed at [facility]    Report Status PENDING  Incomplete  Blood culture (routine x 2)     Status: None (Preliminary result)   Collection Time: 09/19/14  5:20 PM  Result Value Ref Range Status   Specimen Description BLOOD RIGHT HAND  Final   Special Requests BOTTLES DRAWN AEROBIC ONLY 5CC  Final   Culture   Final           BLOOD CULTURE RECEIVED NO GROWTH TO DATE CULTURE WILL BE HELD FOR 5 DAYS BEFORE ISSUING A FINAL NEGATIVE REPORT Performed at [facility]    Report Status PENDING  Incomplete  Culture, respiratory (NON-Expectorated)     Status: None   Collection Time: 09/23/14 11:33 AM  Result Value Ref Range Status   Specimen Description ENDOTRACHEAL  Final   Special Requests NONE  Final   Gram Stain   Final    FEW WBC PRESENT,BOTH PMN AND MONONUCLEAR RARE SQUAMOUS EPITHELIAL CELLS PRESENT RARE YEAST Performed at [facility]    Culture   Final    Non-Pathogenic Oropharyngeal-type Flora Isolated. Performed at [facility]    Report Status 09/25/2014 FINAL  Final  Clostridium Difficile by PCR     Status: None   Collection Time: 09/24/14  9:28 AM  Result Value Ref Range Status   C difficile by pcr NEGATIVE NEGATIVE Final    Coagulation Studies: No results for input(s): LABPROT, INR in the last 72 hours.  Imaging: Dg Chest Portable 1 View  09/25/2014   CLINICAL DATA:  Respiratory failure.  EXAM: PORTABLE CHEST -  1 VIEW  COMPARISON:  09/24/2014.  FINDINGS:  Endotracheal tube, NG tube, bilateral IJ lines in stable position. Mediastinum hilar structures are unremarkable. Heart size normal. Pulmonary vascularity normal. Low lung volumes with mild basilar atelectasis. Continued clearing of pulmonary infiltrates consistent clearing pneumonia. No pneumothorax. No acute osseus abnormality.  IMPRESSION: 1. Lines and tubes in stable position. 2. Interim continued clearing of pulmonary infiltrates with mild bibasilar atelectasis.   Electronically Signed   By: Marcello Moores  Register   On: 09/25/2014 07:27   Dg Chest Port 1 View  09/24/2014   CLINICAL DATA:  Respiratory failure.  EXAM: PORTABLE CHEST - 1 VIEW  COMPARISON:  09/15/2014.  FINDINGS: Endotracheal tube, NG tube, bilateral NG tubes in stable position. Stable cardiomegaly with normal pulmonary vascularity. Interim partial clearing of bilateral pulmonary infiltrates. No pleural effusion or pneumothorax. No acute osseous abnormality.  IMPRESSION: 1. Lines and tubes in stable position. 2. Interim partial clearing of bilateral pulmonary infiltrates.   Electronically Signed   By: Marcello Moores  Register   On: 09/24/2014 07:10    EEG:  Interpretation: This EEG is abnormal with severe localized continuous slowing of cerebral activity consistent with severe diffuse encephalopathy. This pattern of slowing can be seen with hypoxic encephalopathy as well as with metabolic encephalopathy. No evidence of seizure activity was demonstrated.   Assessment and plan per attending neurologist  Etta Quill PA-C [facility] [phone]  09/25/2014, 11:22 AM   Assessment/Plan: I have seen and evaluated the patient. I have reviewed the above note and made appropriate changes.   59 yo F with coma following cardiac arrest. Reviewing her CT, I am concerned there may be some posterior blurring of the gray-white junction, but do not feel that this is definite by CT. Certainly her  persistent lack of motor response at this point is concerning for a possible devastating cerebral injury. I would favor evaluating further with brain MRI.   1) MRI brain  Roland Rack, MD [facility] [phone]  If 7pm- 7am, please page neurology on call as listed in [place].

## 2014-09-25 NOTE — Progress Notes (Signed)
Patient ID: Jamie Fields, female   DOB: May 10, 1956, 59 y.o.   MRN: [id number] S:Intubated/sedated O:BP 178/92 mmHg  Pulse 102  Temp(Src) 97.9 F (36.6 C) (Oral)  Resp 23  Ht 5\' 7"  (1.702 m)  Wt 103.6 kg (228 lb 6.3 oz)  BMI 35.76 kg/m2  SpO2 100%  Intake/Output Summary (Last 24 hours) at 09/25/14 0837 Last data filed at 09/25/14 0800  Gross per 24 hour  Intake   3407 ml  Output   6308 ml  Net  -2901 ml   Intake/Output: I/O last 3 completed shifts: In: 7062 [I.V.:1644; NG/GT:2780; IV Piggyback:1000] Out: 37628 [Urine:5110; BTDVV:6160; Stool:400]  Intake/Output this shift:  Total I/O In: 118 [I.V.:48; NG/GT:70] Out: 325 [Urine:325] Weight change: -4.5 kg (-9 lb 14.7 oz) Gen:WD WF intubated and unresponsive CVS:no rub  Resp:scattered rhonchi VPX:TGGYI hypoactive BS Ext:1+edema   Recent Labs Lab 09/19/14 0327  09/21/14 1659 09/22/14 0525 09/22/14 1730 09/23/14 0420 09/23/14 1730 09/24/14 0413 09/24/14 1715 09/25/14 0428  NA  --   < > 135 138 141 141 141 139 139 144  K  --   < > 4.4 3.2* 3.1* 2.6* 3.1* 3.2* 3.1* 3.7  CL  --   < > 106 105 105 102 104 103 105 106  CO2  --   < > 20 25 24 28 28 31 27 28   GLUCOSE  --   < > 347* 167* 167* 197* 174* 216* 188* 192*  BUN  --   < > 107* 125* 132* 113* 100* 96* 87* 103*  CREATININE  --   < > 3.52* 3.60* 3.49* 2.79* 2.24* 1.98* 1.67* 1.88*  ALBUMIN 1.8*  --  1.2*  --  1.4* 1.5*  1.5* 1.8* 1.7* 1.9* 1.8*  CALCIUM  --   < > 9.7 8.2* 8.3* 8.0* 8.0* 7.8* 8.0* 8.3*  PHOS  --   < >  --  4.9* 5.5* 4.4 3.5 3.7 3.0 4.7*  AST 62*  --  228*  --   --  132*  --   --   --   --   ALT 61*  --  219*  --   --  202*  --   --   --   --   < > = values in this interval not displayed. Liver Function Tests:  Recent Labs Lab 09/19/14 0327 09/21/14 1659  09/23/14 0420  09/24/14 0413 09/24/14 1715 09/25/14 0428  AST 62* 228*  --  132*  --   --   --   --   ALT 61* 219*  --  202*  --   --   --   --   ALKPHOS 36* 101  --  176*  --   --    --   --   BILITOT 2.3* 2.1*  --  4.5*  --   --   --   --   PROT 4.3* 6.2  --  6.8  --   --   --   --   ALBUMIN 1.8* 1.2*  < > 1.5*  1.5*  < > 1.7* 1.9* 1.8*  < > = values in this interval not displayed.  Recent Labs Lab 09/19/14 0327  LIPASE 13  AMYLASE 18   No results for input(s): AMMONIA in the last 168 hours. CBC:  Recent Labs Lab 09/20/14 0330  09/21/14 1659 09/22/14 0525 09/23/14 0420 09/24/14 0413 09/25/14 0428  WBC 13.5*  < > 33.6* 28.7* 40.3* 41.3* 39.2*  NEUTROABS 11.5*  --   --   --   --   --   --  HGB 7.4*  < > 7.8* 7.2* 7.6* 7.7* 7.6*  HCT 22.0*  < > 23.4* 21.3* 22.0* 22.1* 22.6*  MCV 78.0  < > 79.1 77.7* 76.1* 75.4* 77.1*  PLT 18*  < > 53* 60* 92* 125* 151  < > = values in this interval not displayed. Cardiac Enzymes:  Recent Labs Lab 09/19/14 0745 09/21/14 1659  TROPONINI 0.09* 0.28*   CBG:  Recent Labs Lab 09/24/14 1539 09/24/14 1901 09/24/14 2322 09/25/14 0347 09/25/14 0732  GLUCAP 160* 131* 229* 200* 150*    Iron Studies: No results for input(s): IRON, TIBC, TRANSFERRIN, FERRITIN in the last 72 hours. Studies/Results: Dg Chest Portable 1 View  09/25/2014   CLINICAL DATA:  Respiratory failure.  EXAM: PORTABLE CHEST - 1 VIEW  COMPARISON:  09/24/2014.  FINDINGS: Endotracheal tube, NG tube, bilateral IJ lines in stable position. Mediastinum hilar structures are unremarkable. Heart size normal. Pulmonary vascularity normal. Low lung volumes with mild basilar atelectasis. Continued clearing of pulmonary infiltrates consistent clearing pneumonia. No pneumothorax. No acute osseus abnormality.  IMPRESSION: 1. Lines and tubes in stable position. 2. Interim continued clearing of pulmonary infiltrates with mild bibasilar atelectasis.   Electronically Signed   By: Marcello Moores  Register   On: 09/25/2014 07:27   Dg Chest Port 1 View  09/24/2014   CLINICAL DATA:  Respiratory failure.  EXAM: PORTABLE CHEST - 1 VIEW  COMPARISON:  09/15/2014.  FINDINGS: Endotracheal  tube, NG tube, bilateral NG tubes in stable position. Stable cardiomegaly with normal pulmonary vascularity. Interim partial clearing of bilateral pulmonary infiltrates. No pleural effusion or pneumothorax. No acute osseous abnormality.  IMPRESSION: 1. Lines and tubes in stable position. 2. Interim partial clearing of bilateral pulmonary infiltrates.   Electronically Signed   By: Marcello Moores  Register   On: 09/24/2014 07:10   . ampicillin-sulbactam (UNASYN) IV  1.5 g Intravenous Q12H  . antiseptic oral rinse  7 mL Mouth Rinse QID  . chlorhexidine  15 mL Mouth Rinse BID  . feeding supplement (PRO-STAT SUGAR FREE 64)  30 mL Oral BID  . heparin subcutaneous  5,000 Units Subcutaneous 3 times per day  . hydrocortisone sod succinate (SOLU-CORTEF) inj  50 mg Intravenous Q12H  . insulin aspart  0-20 Units Subcutaneous 6 times per day  . ipratropium-albuterol  3 mL Nebulization Q6H  . pantoprazole sodium  40 mg Per Tube Daily    BMET    Component Value Date/Time   NA 144 09/25/2014 0428   K 3.7 09/25/2014 0428   CL 106 09/25/2014 0428   CO2 28 09/25/2014 0428   GLUCOSE 192* 09/25/2014 0428   BUN 103* 09/25/2014 0428   CREATININE 1.88* 09/25/2014 0428   CALCIUM 8.3* 09/25/2014 0428   GFRNONAA 28* 09/25/2014 0428   GFRAA 33* 09/25/2014 0428   CBC    Component Value Date/Time   WBC 39.2* 09/25/2014 0428   RBC 2.93* 09/25/2014 0428   HGB 7.6* 09/25/2014 0428   HCT 22.6* 09/25/2014 0428   PLT 151 09/25/2014 0428   MCV 77.1* 09/25/2014 0428   MCH 25.9* 09/25/2014 0428   MCHC 33.6 09/25/2014 0428   RDW 16.4* 09/25/2014 0428   LYMPHSABS 0.9 09/20/2014 0330   MONOABS 1.1* 09/20/2014 0330   EOSABS 0.0 09/20/2014 0330   BASOSABS 0.0 09/20/2014 0330     Assessment/Plan:  1. ARF in setting of sepsis and need for pressors- currently on CVVHD with marked increase in UOP. BP's have improved and she is off pressors, with negative 3 liters over  the last 24 hours and 16 liters over the last 48  hrs. Will continue hold cvvhd and lasix today and follow UOP and daily labs as she may require IHD. 2. VDRF- per PCCM 3. Group A strep sepsis- on antibiotics and off pressors. Also with erythema of right femoral area, CT scan was negative for necrotizing fasciitis. Cont with antibiotics and supportive care. 4. Volume overload- as above, marked diuresis, ok to hold CVVHD for now and follow since she has had good UOP. 5. Hypernatremia- will start free water boluses through OG/NGT at 250cc q4 6. Anemia- transfuse prn. 7. Protein malnutrition.  [place] A

## 2014-09-25 NOTE — Progress Notes (Signed)
PULMONARY / CRITICAL CARE MEDICINE   Name: Jamie Fields MRN: [id number] DOB: March 13, 1956    ADMISSION DATE:  09/17/2014 CONSULTATION DATE:  09/17/2014   REFERRING MD :  [facility]  CHIEF COMPLAINT:  Respiratory Distress  INITIAL PRESENTATION: 59 y.o. F who was taken to [facility] ED early AM hours 2/22 for respiratory distress.  She was admitted and placed on BiPAP.  Was also found to have acute renal failure, AG metabolic acidosis, Overnight, her acidosis and respiratory status worsened to the point she required intubation.  She was also placed on levophed for persistent shock.  Later in day 2/22 was transferred to [facility] for further management.  STUDIES:  CT Chest 2/22 >> no acute process CT abd / pelv 2/22 >> iliac lymph nodes likely reactive.  No acute process CXR 2/23  >> Persistent mild vascular congestion and mild interstitial edema bilaterally. Superimposed infiltrate right upper lobe and left base cannot be excluded. Echo 3/66 >> Systolic function was normal. EF 60% to 65%. Right Tib/Fib DG >> No evidence of osteo; Mild soft tissue swelling RUQ [place] 2/23 >> Liver echogenicity is diffusely increased and somewhat heterogeneous. Most likely are indicative of hepatic steatosis CT tib/fib Rt >> Negative for osteomyelitis or necrotizing fasciitis. Superficial edema and blistering CXR 2/28 >> No change in bilateral airspace disease opacities representing multifocal pneumonia versus asymmetric edema  SIGNIFICANT EVENTS: 2/22  Admitted at [place]; respiratory failure-intubated; shock; transferred to [facility] 2/23  On [place]; intubated; Fever, CXR infiltrate = sepsis 2/24  Group A strep, added Clinda; CT neg for nec fas; Tube feeds started 2/26  Hypoxia, brady arrest/ PEA in pm  2/27  CVVHD initiated  2/28  Pt net neg 6L; New CXR infiltrate - Vanc restarted 2/29- cvvhd off, urine output increased   SUBJECTIVE: HTN, vent dyschrony  VITAL SIGNS: Temp:  [97.9 F (36.6 C)-98.7 F  (37.1 C)] 97.9 F (36.6 C) (03/01 0734) Pulse Rate:  [94-111] 102 (03/01 0800) Resp:  [19-29] 23 (03/01 0800) BP: (104-178)/(50-95) 178/92 mmHg (03/01 0800) SpO2:  [96 %-100 %] 100 % (03/01 0800) Arterial Line BP: (114-223)/(55-89) 223/89 mmHg (03/01 0800) FiO2 (%):  [40 %-50 %] 40 % (03/01 0800) Weight:  [103.6 kg (228 lb 6.3 oz)] 103.6 kg (228 lb 6.3 oz) (03/01 0400)   HEMODYNAMICS: CVP:  [10 mmHg] 10 mmHg   VENTILATOR SETTINGS: Vent Mode:  [-] PRVC FiO2 (%):  [40 %-50 %] 40 % Set Rate:  [18 bmp] 18 bmp Vt Set:  [490 mL] 490 mL PEEP:  [5 cmH20] 5 cmH20 Plateau Pressure:  [18 cmH20-25 cmH20] 25 cmH20   INTAKE / OUTPUT:  Intake/Output Summary (Last 24 hours) at 09/25/14 0856 Last data filed at 09/25/14 0800  Gross per 24 hour  Intake   3407 ml  Output   6308 ml  Net  -2901 ml   PHYSICAL EXAMINATION: General: Obese female, in NAD Neuro: Sedated on vent. Rass -4 HEENT: [place]/AT. PERRL 3 mm Cardiovascular: RRR, no M/R/G.  Lungs: cta Abdomen: Obese, BS x 4, soft, NT/ND.  Musculoskeletal: edema improved Skin: RLE dressing c/d/i, bulla on R inner thigh ruptured  LABS:  CBC  Recent Labs Lab 09/23/14 0420 09/24/14 0413 09/25/14 0428  WBC 40.3* 41.3* 39.2*  HGB 7.6* 7.7* 7.6*  HCT 22.0* 22.1* 22.6*  PLT 92* 125* 151   Coag's  Recent Labs Lab 09/20/14 0610 09/21/14 1659  APTT 37  --   INR 1.30 1.39   BMET  Recent Labs  Lab 09/24/14 0413 09/24/14 1715 09/25/14 0428  NA 139 139 144  K 3.2* 3.1* 3.7  CL 103 105 106  CO2 31 27 28   BUN 96* 87* 103*  CREATININE 1.98* 1.67* 1.88*  GLUCOSE 216* 188* 192*   Electrolytes  Recent Labs Lab 09/23/14 0420  09/24/14 0413 09/24/14 1715 09/25/14 0428  CALCIUM 8.0*  < > 7.8* 8.0* 8.3*  MG 2.2  --  2.3  --  2.1  PHOS 4.4  < > 3.7 3.0 4.7*  < > = values in this interval not displayed.   Sepsis Markers  Recent Labs Lab 09/21/14 1659 09/21/14 1959 09/24/14 0937  LATICACIDVEN 3.3* 1.4 1.6      ABG  Recent Labs Lab 09/23/14 0410 09/24/14 0943 09/25/14 0335  PHART 7.312* 7.393 7.401  PCO2ART 50.9* 45.2* 43.5  PO2ART 68.2* 115.0* 97.2   Liver Enzymes  Recent Labs Lab 09/19/14 0327 09/21/14 1659  09/23/14 0420  09/24/14 0413 09/24/14 1715 09/25/14 0428  AST 62* 228*  --  132*  --   --   --   --   ALT 61* 219*  --  202*  --   --   --   --   ALKPHOS 36* 101  --  176*  --   --   --   --   BILITOT 2.3* 2.1*  --  4.5*  --   --   --   --   ALBUMIN 1.8* 1.2*  < > 1.5*  1.5*  < > 1.7* 1.9* 1.8*  < > = values in this interval not displayed. Cardiac Enzymes  Recent Labs Lab 09/19/14 0745 09/21/14 1659  TROPONINI 0.09* 0.28*   Glucose  Recent Labs Lab 09/24/14 1130 09/24/14 1539 09/24/14 1901 09/24/14 2322 09/25/14 0347 09/25/14 0732  GLUCAP 177* 160* 131* 229* 200* 150*    Imaging Dg Chest Port 1 View  09/24/2014   CLINICAL DATA:  Respiratory failure.  EXAM: PORTABLE CHEST - 1 VIEW  COMPARISON:  09/15/2014.  FINDINGS: Endotracheal tube, NG tube, bilateral NG tubes in stable position. Stable cardiomegaly with normal pulmonary vascularity. Interim partial clearing of bilateral pulmonary infiltrates. No pleural effusion or pneumothorax. No acute osseous abnormality.  IMPRESSION: 1. Lines and tubes in stable position. 2. Interim partial clearing of bilateral pulmonary infiltrates.   Electronically Signed   By: Marcello Moores  Register   On: 09/24/2014 07:10   ASSESSMENT / PLAN:  PULMONARY OETT 2/22 >>> A: Acute hypoxic respiratory failure COPD without evidence of exacerbation ARDS P:   Keep same TV ABG reviewed, keep same MV, rate  Attempt SBT, cpap5 ps 5, goal 2 hr Need to assess neuro prognostication as it relates to trach needs pcxr resolving with neg balance  CARDIOVASCULAR CVL R fem 2/22 >> 2/23 CVL L IJ 2/23 >> Aline L rad 2/23 >> A:  Shock - Septic +/-  Hypovolemic Lactic Acidosis - resolved 2/25 Hx HTN, active  Adrenal Insufficiency -  unfortunately received steroids before level, so empirically treat Sinus tachy P:  Add IBV prn labetalol Add oral labetalol  Empiric steroids started 2/23 >> dc roids as HTN SE issue Assess neuro status driving BP  RENAL A:   Mixed metabolic acidosis - due to lactate & renal failure Acute renal failure - Baseline Cr unknown, no hydro on CT.  UA, urine na, osm: Granular cast; FENa 0.36 % Rhabdo - improving with IVF resuscitation Pseudohypocalcemia - corrects to wnl P:   Off hd, urine output improved, hope  for improved crt / bun kvo Lasix on hold If post atn diuresis, continued worsening fxn but improved output, then may need to bolus back and add volume Assess urine osm  GASTROINTESTINAL A:  Transaminitis - Improving GERD Obesity  Severe protein calorie malnutrition  diarhea P:   Trend LFT's in am  Pantoprazole. Tube feeds started 2/24 + SSI Add prostat 2/28 See ID Ensure vital cdiff neg, add imodium  HEMATOLOGIC A:   Anemia VTE prophylaxis Thrombocytopenia - improving (after diuresis)- hemoconcetrating P:  Transfuse per ICU guidelines. DVT proph:  SCD's CBC in AM sub q hep  INFECTIOUS A:   Group A Strep Septic Shock P:   BCx2 2/22 >>NG Resp sputum >>> RARE GROUP A STREP (S.PYOGENES) ISOLATED Sputum 2/28- NF  Levofloxacin 2/23>>>2/24 Clinda 2/24 >> 2/26 Vanc, start date 2/22>>>2/26 ---- Restarted 2/28 >>2/29 Zosyn, start date 2/22>>>2/26 Unasyn 2/26 >> IVIG 2/24 >> 2/25  Maintain Will assess stop date in am   ENDOCRINE A:   R/o rel AI - cortisol 58 but had received dose IV  R/o sick euthryoid - TSH low: 0.258,  T3 Low (37); free T4 low (0.74)  P:   SSI while on tube feeds Solu-Cortef 50 q6hrs, dc  NEUROLOGIC A:   Acute metabolic encephalopathy R/o anoxia post arrest Hx Depression, Anxiety, Pain Vent dyschrony P:   Sedation:  Fentanyl gtt / Versed gtt RASS goal: -2 Severe dyschrony, use fent as main stay Goal tyo dc versed drip add q4h  then WUA as tolerated  UDS: Positive for opiods Hold outpatient gabapentin, meclizine, sumatriptan, amitriptyline, duloxetine, MS Contin, percocet, opana ER, sertraline. EEG for subclinical seziures - on going CT head  FAMILY  - Updates: family updated 2/28 am.  They are planning a trip and will be leaving the country on 3/1 depending on patients status.  Will need to discuss possibility of trach prior to leaving.  They are aware she may need this and are agreeable to procedure. Will need further discussion / consent via son.    - Inter-disciplinary family meet or Palliative Care meeting due by:  2/28- done 3/1 meeting son   Ccm time 19 min   [name]. Titus Mould, MD, [place] Pgr: [facility]

## 2014-09-25 NOTE — Progress Notes (Signed)
[place] Progress Note Patient Name: Jamie Fields DOB: 1955-12-01 MRN: [id number]   Date of Service  09/25/2014  HPI/Events of Note  Requested to f/u finding of head CT scan  Interventions No acute intercranial finding noted.     Intervention Category Intermediate Interventions: Diagnostic test evaluation  Sommer,Steven Cornelia Copa 09/25/2014, 7:40 PM

## 2014-09-26 ENCOUNTER — Inpatient Hospital Stay (HOSPITAL_COMMUNITY): Payer: Commercial Managed Care - HMO

## 2014-09-26 DIAGNOSIS — R0902 Hypoxemia: Secondary | ICD-10-CM | POA: Insufficient documentation

## 2014-09-26 DIAGNOSIS — R4020 Unspecified coma: Secondary | ICD-10-CM

## 2014-09-26 DIAGNOSIS — J9600 Acute respiratory failure, unspecified whether with hypoxia or hypercapnia: Secondary | ICD-10-CM | POA: Insufficient documentation

## 2014-09-26 LAB — GLUCOSE, CAPILLARY
GLUCOSE-CAPILLARY: 133 mg/dL — AB (ref 70–99)
GLUCOSE-CAPILLARY: 140 mg/dL — AB (ref 70–99)
GLUCOSE-CAPILLARY: 182 mg/dL — AB (ref 70–99)
GLUCOSE-CAPILLARY: 183 mg/dL — AB (ref 70–99)
Glucose-Capillary: 157 mg/dL — ABNORMAL HIGH (ref 70–99)
Glucose-Capillary: 272 mg/dL — ABNORMAL HIGH (ref 70–99)

## 2014-09-26 LAB — CULTURE, BLOOD (ROUTINE X 2)
CULTURE: NO GROWTH
Culture: NO GROWTH

## 2014-09-26 LAB — CBC WITH DIFFERENTIAL/PLATELET
BASOS PCT: 0 % (ref 0–1)
Basophils Absolute: 0 10*3/uL (ref 0.0–0.1)
EOS PCT: 0 % (ref 0–5)
Eosinophils Absolute: 0 10*3/uL (ref 0.0–0.7)
HCT: 21.8 % — ABNORMAL LOW (ref 36.0–46.0)
HEMOGLOBIN: 7.1 g/dL — AB (ref 12.0–15.0)
LYMPHS ABS: 2.8 10*3/uL (ref 0.7–4.0)
Lymphocytes Relative: 12 % (ref 12–46)
MCH: 26.4 pg (ref 26.0–34.0)
MCHC: 32.6 g/dL (ref 30.0–36.0)
MCV: 81 fL (ref 78.0–100.0)
MONOS PCT: 4 % (ref 3–12)
Monocytes Absolute: 0.9 10*3/uL (ref 0.1–1.0)
Neutro Abs: 19.9 10*3/uL — ABNORMAL HIGH (ref 1.7–7.7)
Neutrophils Relative %: 84 % — ABNORMAL HIGH (ref 43–77)
Platelets: 154 10*3/uL (ref 150–400)
RBC: 2.69 MIL/uL — ABNORMAL LOW (ref 3.87–5.11)
RDW: 18.7 % — ABNORMAL HIGH (ref 11.5–15.5)
WBC: 23.6 10*3/uL — ABNORMAL HIGH (ref 4.0–10.5)

## 2014-09-26 LAB — URINE MICROSCOPIC-ADD ON

## 2014-09-26 LAB — URINALYSIS, ROUTINE W REFLEX MICROSCOPIC
BILIRUBIN URINE: NEGATIVE
Glucose, UA: NEGATIVE mg/dL
KETONES UR: NEGATIVE mg/dL
Leukocytes, UA: NEGATIVE
NITRITE: NEGATIVE
Protein, ur: NEGATIVE mg/dL
Specific Gravity, Urine: 1.017 (ref 1.005–1.030)
UROBILINOGEN UA: 0.2 mg/dL (ref 0.0–1.0)
pH: 5.5 (ref 5.0–8.0)

## 2014-09-26 LAB — RENAL FUNCTION PANEL
ALBUMIN: 1.7 g/dL — AB (ref 3.5–5.2)
Anion gap: 13 (ref 5–15)
BUN: 123 mg/dL — ABNORMAL HIGH (ref 6–23)
CALCIUM: 7.6 mg/dL — AB (ref 8.4–10.5)
CO2: 23 mmol/L (ref 19–32)
Chloride: 116 mmol/L — ABNORMAL HIGH (ref 96–112)
Creatinine, Ser: 2.19 mg/dL — ABNORMAL HIGH (ref 0.50–1.10)
GFR calc Af Amer: 27 mL/min — ABNORMAL LOW (ref >90)
GFR calc non Af Amer: 23 mL/min — ABNORMAL LOW (ref >90)
Glucose, Bld: 221 mg/dL — ABNORMAL HIGH (ref 70–99)
Phosphorus: 6.8 mg/dL — ABNORMAL HIGH (ref 2.3–4.6)
Potassium: 3.1 mmol/L — ABNORMAL LOW (ref 3.5–5.1)
SODIUM: 152 mmol/L — AB (ref 135–145)

## 2014-09-26 LAB — COMPREHENSIVE METABOLIC PANEL
ALT: 139 U/L — ABNORMAL HIGH (ref 0–35)
ANION GAP: 11 (ref 5–15)
AST: 100 U/L — ABNORMAL HIGH (ref 0–37)
Albumin: 1.8 g/dL — ABNORMAL LOW (ref 3.5–5.2)
Alkaline Phosphatase: 472 U/L — ABNORMAL HIGH (ref 39–117)
BUN: 132 mg/dL — AB (ref 6–23)
CALCIUM: 7.7 mg/dL — AB (ref 8.4–10.5)
CO2: 26 mmol/L (ref 19–32)
CREATININE: 2.39 mg/dL — AB (ref 0.50–1.10)
Chloride: 114 mmol/L — ABNORMAL HIGH (ref 96–112)
GFR calc Af Amer: 24 mL/min — ABNORMAL LOW (ref >90)
GFR, EST NON AFRICAN AMERICAN: 21 mL/min — AB (ref >90)
Glucose, Bld: 166 mg/dL — ABNORMAL HIGH (ref 70–99)
Potassium: 3.4 mmol/L — ABNORMAL LOW (ref 3.5–5.1)
Sodium: 151 mmol/L — ABNORMAL HIGH (ref 135–145)
Total Bilirubin: 1.8 mg/dL — ABNORMAL HIGH (ref 0.3–1.2)
Total Protein: 6.4 g/dL (ref 6.0–8.3)

## 2014-09-26 LAB — MAGNESIUM: MAGNESIUM: 2.1 mg/dL (ref 1.5–2.5)

## 2014-09-26 LAB — PHOSPHORUS: PHOSPHORUS: 5.9 mg/dL — AB (ref 2.3–4.6)

## 2014-09-26 MED ORDER — POTASSIUM CL IN DEXTROSE 5% 20 MEQ/L IV SOLN
20.0000 meq | INTRAVENOUS | Status: DC
  Administered 2014-09-26 – 2014-09-27 (×3): 20 meq via INTRAVENOUS
  Filled 2014-09-26 (×4): qty 1000

## 2014-09-26 MED ORDER — DEXTROSE 5 % IV SOLN
2.0000 g | Freq: Three times a day (TID) | INTRAVENOUS | Status: DC
  Filled 2014-09-26 (×2): qty 2

## 2014-09-26 MED ORDER — VANCOMYCIN HCL 10 G IV SOLR
2000.0000 mg | Freq: Once | INTRAVENOUS | Status: AC
  Administered 2014-09-26: 2000 mg via INTRAVENOUS
  Filled 2014-09-26: qty 2000

## 2014-09-26 MED ORDER — DEXTROSE 5 % IV SOLN
2.0000 g | INTRAVENOUS | Status: DC
  Administered 2014-09-26 – 2014-09-27 (×2): 2 g via INTRAVENOUS
  Filled 2014-09-26 (×3): qty 2

## 2014-09-26 NOTE — Progress Notes (Signed)
ANTIBIOTIC CONSULT NOTE   Pharmacy Consult for vancomycin + ceftaz Indication: GAS bacteremia, pna, fevers  No Known Allergies  Patient Measurements: Height: 5\' 7"  (170.2 cm) Weight: 231 lb 4.2 oz (104.9 kg) IBW/kg (Calculated) : 61.6  Vital Signs: Temp: 102.3 F (39.1 C) (03/02 1146) Temp Source: Rectal (03/02 1146) BP: 127/59 mmHg (03/02 1200) Pulse Rate: 92 (03/02 1200) Intake/Output from previous day: 03/01 0701 - 03/02 0700 In: 2166.3 [I.V.:611.3; FY/BO:1751; IV Piggyback:100] Out: 2725 [Urine:2625; Stool:100] Intake/Output from this shift: Total I/O In: 825 [I.V.:175; NG/GT:600; IV Piggyback:50] Out: 1045 [Urine:1045]  Labs:  Recent Labs  09/24/14 0413 09/24/14 1715 09/25/14 0428 09/26/14 0459  WBC 41.3*  --  39.2* 23.6*  HGB 7.7*  --  7.6* 7.1*  PLT 125*  --  151 154  CREATININE 1.98* 1.67* 1.88* 2.39*   Estimated Creatinine Clearance: 31.6 mL/min (by C-G formula based on Cr of 2.39). No results for input(s): VANCOTROUGH, VANCOPEAK, VANCORANDOM, GENTTROUGH, GENTPEAK, GENTRANDOM, TOBRATROUGH, TOBRAPEAK, TOBRARND, AMIKACINPEAK, AMIKACINTROU, AMIKACIN in the last 72 hours.   Microbiology: Recent Results (from the past 720 hour(s))  MRSA PCR Screening     Status: None   Collection Time: 09/17/14  9:25 PM  Result Value Ref Range Status   MRSA by PCR NEGATIVE NEGATIVE Final    Comment:        The GeneXpert MRSA Assay (FDA approved for NASAL specimens only), is one component of a comprehensive MRSA colonization surveillance program. It is not intended to diagnose MRSA infection nor to guide or monitor treatment for MRSA infections.   Culture, Urine     Status: None   Collection Time: 09/18/14  8:26 AM  Result Value Ref Range Status   Specimen Description URINE, CATHETERIZED  Final   Special Requests Normal  Final   Colony Count NO GROWTH Performed at [facility]   Final   Culture NO GROWTH Performed at [facility]   Final   Report Status 09/19/2014 FINAL  Final  Blood culture (routine x 2)     Status: None   Collection Time: 09/18/14  9:05 AM  Result Value Ref Range Status   Specimen Description BLOOD LEFT HAND  Final   Special Requests BOTTLES DRAWN AEROBIC ONLY [place]  Final   Culture   Final    NO GROWTH 5 DAYS Performed at [facility]    Report Status 09/24/2014 FINAL  Final  Respiratory virus panel (routine influenza)     Status: None   Collection Time: 09/18/14  9:12 AM  Result Value Ref Range Status   Source - RVPAN NOSE  Corrected   Respiratory Syncytial Virus A Negative Negative Final   Respiratory Syncytial Virus B Negative Negative Final   Influenza A Negative Negative Final   Influenza B Negative Negative Final   Parainfluenza 1 Negative Negative Final   Parainfluenza 2 Negative Negative Final   Parainfluenza 3 Negative Negative Final   Metapneumovirus Negative Negative Final   Rhinovirus Negative Negative Final   Adenovirus Negative Negative Final    Comment: (NOTE) Performed At: [facility] [place], [place] [id number] Lindon Romp MD EU:[id number]   Blood culture (routine x 2)     Status: None   Collection Time: 09/18/14 10:30 AM  Result Value Ref Range Status   Specimen Description BLOOD LEFT CENTRAL LINE  Final   Special Requests BOTTLES DRAWN AEROBIC AND ANAEROBIC 10CC  Final   Culture   Final    NO GROWTH  5 DAYS Performed at [facility]    Report Status 09/24/2014 FINAL  Final  Culture, respiratory (NON-Expectorated)     Status: None   Collection Time: 09/18/14 11:34 AM  Result Value Ref Range Status   Specimen Description TRACHEAL ASPIRATE  Final   Special Requests NONE  Final   Gram Stain   Final    ABUNDANT WBC PRESENT,BOTH PMN AND MONONUCLEAR RARE SQUAMOUS EPITHELIAL CELLS PRESENT RARE YEAST Performed at [facility]    Culture   Final    FEW YEAST CONSISTENT WITH CANDIDA SPECIES RARE GROUP A STREP (S.PYOGENES)  ISOLATED Performed at [facility]    Report Status 09/20/2014 FINAL  Final  Blood culture (routine x 2)     Status: None   Collection Time: 09/19/14  5:00 PM  Result Value Ref Range Status   Specimen Description BLOOD RIGHT ARM  Final   Special Requests BOTTLES DRAWN AEROBIC ONLY 10CC  Final   Culture   Final    NO GROWTH 5 DAYS Performed at [facility]    Report Status 09/26/2014 FINAL  Final  Blood culture (routine x 2)     Status: None   Collection Time: 09/19/14  5:20 PM  Result Value Ref Range Status   Specimen Description BLOOD RIGHT HAND  Final   Special Requests BOTTLES DRAWN AEROBIC ONLY 5CC  Final   Culture   Final    NO GROWTH 5 DAYS Performed at [facility]    Report Status 09/26/2014 FINAL  Final  Culture, respiratory (NON-Expectorated)     Status: None   Collection Time: 09/23/14 11:33 AM  Result Value Ref Range Status   Specimen Description ENDOTRACHEAL  Final   Special Requests NONE  Final   Gram Stain   Final    FEW WBC PRESENT,BOTH PMN AND MONONUCLEAR RARE SQUAMOUS EPITHELIAL CELLS PRESENT RARE YEAST Performed at [facility]    Culture   Final    Non-Pathogenic Oropharyngeal-type Flora Isolated. Performed at [facility]    Report Status 09/25/2014 FINAL  Final  Clostridium Difficile by PCR     Status: None   Collection Time: 09/24/14  9:28 AM  Result Value Ref Range Status   C difficile by pcr NEGATIVE NEGATIVE Final    Assessment: 59 y.o. female previously on unasyn now with new fevers (Tmax 103). Broadening antibiotics to ceftazidime and vancomycin. Scr is trending back up off CRRT. May require HD per renal.   Ceftaz 3/2>> 2/22 Vanc >>2/26; 2/28>>2/29; 3/2>> 2/22 Zosyn >>2/26 2/22 Levaquin >>2/24 2/23 Tamiflu >>2/24 2/24 Clindamycin >>2/26 Unasyn 2/26>>  2/23 MRSA PCR neg 2/23 Flu neg 2/23 Urine >>neg 2/23 TA >> few candida albicans; rare GAS 2/23 Blood >> ngtd [place] blood >> 1/2 GAS (do  not conduct sensirivities)  Goal of Therapy:  Resolution of infection   Plan:  1. Vancomycin 2gm IV x 1 - f/u AM BMET for maintenance dose 2. Ceftazidime 2gm IV Q24H 3. F/u renal plans, C&S, clinical status and trough at [facility], PharmD, BCPS Pager # [phone] 09/26/2014 1:16 PM

## 2014-09-26 NOTE — Progress Notes (Signed)
Subjective: Patient on vent and non-responsive.   Objective: Current vital signs: BP 123/66 mmHg  Pulse 104  Temp(Src) 103 F (39.4 C) (Oral)  Resp 30  Ht 5\' 7"  (1.702 m)  Wt 104.9 kg (231 lb 4.2 oz)  BMI 36.21 kg/m2  SpO2 99% Vital signs in last 24 hours: Temp:  [98.6 F (37 C)-103 F (39.4 C)] 103 F (39.4 C) (03/02 0804) Pulse Rate:  [93-116] 104 (03/02 0900) Resp:  [15-40] 30 (03/02 0900) BP: (94-198)/(48-102) 123/66 mmHg (03/02 0900) SpO2:  [94 %-100 %] 99 % (03/02 0900) Arterial Line BP: (85-178)/(41-71) 134/61 mmHg (03/02 0900) FiO2 (%):  [40 %] 40 % (03/02 0800) Weight:  [104.9 kg (231 lb 4.2 oz)] 104.9 kg (231 lb 4.2 oz) (03/02 0424)  Intake/Output from previous day: 03/01 0701 - 03/02 0700 In: 2166.3 [I.V.:611.3; VO/ZD:6644; IV Piggyback:100] Out: 2725 [Urine:2625; Stool:100] Intake/Output this shift: Total I/O In: 545 [I.V.:35; NG/GT:460; IV Piggyback:50] Out: 495 [Urine:495] Nutritional status: Diet NPO time specified  Neurologic Exam: Mental Status: Patient does not respond to verbal stimuli. Does not respond to deep sternal rub. Does not follow commands. No verbalizations noted. Intubated breathing over the vent Cranial Nerves: II: patient does not respond confrontation bilaterally, pupils right 2 mm, left 2 mm,and reactive bilaterally III,IV,VI: doll's response present bilaterally. V,VII: corneal reflex present bilaterally  VIII: patient does not respond to verbal stimuli IX,X: gag reflex present, XI: trapezius strength unable to test bilaterally XII: tongue strength unable to test Motor: Extremities flaccid throughout. No spontaneous movement noted. No purposeful movements noted. Sensory: Does not respond to noxious stimuli in any extremity. Deep Tendon Reflexes:  Absent throughout. Plantars: mute bilaterally  Cerebellar: Unable to perform  Lab Results: Basic Metabolic Panel:  Recent Labs Lab 09/22/14 0525  09/23/14 0420  09/23/14 1730 09/24/14 0413 09/24/14 1715 09/25/14 0428 09/26/14 0459  NA 138  < > 141 141 139 139 144 151*  K 3.2*  < > 2.6* 3.1* 3.2* 3.1* 3.7 3.4*  CL 105  < > 102 104 103 105 106 114*  CO2 25  < > 28 28 31 27 28 26   GLUCOSE 167*  < > 197* 174* 216* 188* 192* 166*  BUN 125*  < > 113* 100* 96* 87* 103* 132*  CREATININE 3.60*  < > 2.79* 2.24* 1.98* 1.67* 1.88* 2.39*  CALCIUM 8.2*  < > 8.0* 8.0* 7.8* 8.0* 8.3* 7.7*  MG 2.3  --  2.2  --  2.3  --  2.1 2.1  PHOS 4.9*  < > 4.4 3.5 3.7 3.0 4.7* 5.9*  < > = values in this interval not displayed.  Liver Function Tests:  Recent Labs Lab 09/21/14 1659  09/23/14 0420  09/24/14 0413 09/24/14 1715 09/25/14 0428 09/25/14 1134 09/26/14 0459  AST 228*  --  132*  --   --   --   --  87* 100*  ALT 219*  --  202*  --   --   --   --  145* 139*  ALKPHOS 101  --  176*  --   --   --   --  414* 472*  BILITOT 2.1*  --  4.5*  --   --   --   --  2.3* 1.8*  PROT 6.2  --  6.8  --   --   --   --  6.8 6.4  ALBUMIN 1.2*  < > 1.5*  1.5*  < > 1.7* 1.9* 1.8* 1.8* 1.8*  < > =  values in this interval not displayed. No results for input(s): LIPASE, AMYLASE in the last 168 hours.  Recent Labs Lab 09/25/14 1156  AMMONIA 47*    CBC:  Recent Labs Lab 09/20/14 0330  09/22/14 0525 09/23/14 0420 09/24/14 0413 09/25/14 0428 09/26/14 0459  WBC 13.5*  < > 28.7* 40.3* 41.3* 39.2* 23.6*  NEUTROABS 11.5*  --   --   --   --   --  19.9*  HGB 7.4*  < > 7.2* 7.6* 7.7* 7.6* 7.1*  HCT 22.0*  < > 21.3* 22.0* 22.1* 22.6* 21.8*  MCV 78.0  < > 77.7* 76.1* 75.4* 77.1* 81.0  PLT 18*  < > 60* 92* 125* 151 154  < > = values in this interval not displayed.  Cardiac Enzymes:  Recent Labs Lab 09/21/14 1659  TROPONINI 0.28*    Lipid Panel: No results for input(s): CHOL, TRIG, HDL, CHOLHDL, VLDL, LDLCALC in the last 168 hours.  CBG:  Recent Labs Lab 09/25/14 1554 09/25/14 1952 09/26/14 0022 09/26/14 0433 09/26/14 0756  GLUCAP 139* 162* 140* 157* 133*     Microbiology: Results for orders placed or performed during the hospital encounter of 09/17/14  MRSA PCR Screening     Status: None   Collection Time: 09/17/14  9:25 PM  Result Value Ref Range Status   MRSA by PCR NEGATIVE NEGATIVE Final    Comment:        The GeneXpert MRSA Assay (FDA approved for NASAL specimens only), is one component of a comprehensive MRSA colonization surveillance program. It is not intended to diagnose MRSA infection nor to guide or monitor treatment for MRSA infections.   Culture, Urine     Status: None   Collection Time: 09/18/14  8:26 AM  Result Value Ref Range Status   Specimen Description URINE, CATHETERIZED  Final   Special Requests Normal  Final   Colony Count NO GROWTH Performed at [facility]   Final   Culture NO GROWTH Performed at [facility]   Final   Report Status 09/19/2014 FINAL  Final  Blood culture (routine x 2)     Status: None   Collection Time: 09/18/14  9:05 AM  Result Value Ref Range Status   Specimen Description BLOOD LEFT HAND  Final   Special Requests BOTTLES DRAWN AEROBIC ONLY [place]  Final   Culture   Final    NO GROWTH 5 DAYS Performed at [facility]    Report Status 09/24/2014 FINAL  Final  Respiratory virus panel (routine influenza)     Status: None   Collection Time: 09/18/14  9:12 AM  Result Value Ref Range Status   Source - RVPAN NOSE  Corrected   Respiratory Syncytial Virus A Negative Negative Final   Respiratory Syncytial Virus B Negative Negative Final   Influenza A Negative Negative Final   Influenza B Negative Negative Final   Parainfluenza 1 Negative Negative Final   Parainfluenza 2 Negative Negative Final   Parainfluenza 3 Negative Negative Final   Metapneumovirus Negative Negative Final   Rhinovirus Negative Negative Final   Adenovirus Negative Negative Final    Comment: (NOTE) Performed At: [facility] [place], [place] [id number] Lindon Romp MD JA:[id number]   Blood culture (routine x 2)     Status: None   Collection Time: 09/18/14 10:30 AM  Result Value Ref Range Status   Specimen Description BLOOD LEFT CENTRAL LINE  Final   Special Requests BOTTLES DRAWN AEROBIC  AND ANAEROBIC 10CC  Final   Culture   Final    NO GROWTH 5 DAYS Performed at [facility]    Report Status 09/24/2014 FINAL  Final  Culture, respiratory (NON-Expectorated)     Status: None   Collection Time: 09/18/14 11:34 AM  Result Value Ref Range Status   Specimen Description TRACHEAL ASPIRATE  Final   Special Requests NONE  Final   Gram Stain   Final    ABUNDANT WBC PRESENT,BOTH PMN AND MONONUCLEAR RARE SQUAMOUS EPITHELIAL CELLS PRESENT RARE YEAST Performed at [facility]    Culture   Final    FEW YEAST CONSISTENT WITH CANDIDA SPECIES RARE GROUP A STREP (S.PYOGENES) ISOLATED Performed at [facility]    Report Status 09/20/2014 FINAL  Final  Blood culture (routine x 2)     Status: None   Collection Time: 09/19/14  5:00 PM  Result Value Ref Range Status   Specimen Description BLOOD RIGHT ARM  Final   Special Requests BOTTLES DRAWN AEROBIC ONLY 10CC  Final   Culture   Final    NO GROWTH 5 DAYS Performed at [facility]    Report Status 09/26/2014 FINAL  Final  Blood culture (routine x 2)     Status: None   Collection Time: 09/19/14  5:20 PM  Result Value Ref Range Status   Specimen Description BLOOD RIGHT HAND  Final   Special Requests BOTTLES DRAWN AEROBIC ONLY 5CC  Final   Culture   Final    NO GROWTH 5 DAYS Performed at [facility]    Report Status 09/26/2014 FINAL  Final  Culture, respiratory (NON-Expectorated)     Status: None   Collection Time: 09/23/14 11:33 AM  Result Value Ref Range Status   Specimen Description ENDOTRACHEAL  Final   Special Requests NONE  Final   Gram Stain   Final    FEW WBC PRESENT,BOTH PMN AND MONONUCLEAR RARE SQUAMOUS EPITHELIAL CELLS PRESENT RARE  YEAST Performed at [facility]    Culture   Final    Non-Pathogenic Oropharyngeal-type Flora Isolated. Performed at [facility]    Report Status 09/25/2014 FINAL  Final  Clostridium Difficile by PCR     Status: None   Collection Time: 09/24/14  9:28 AM  Result Value Ref Range Status   C difficile by pcr NEGATIVE NEGATIVE Final    Coagulation Studies: No results for input(s): LABPROT, INR in the last 72 hours.  Imaging: Ct Head Wo Contrast  09/25/2014   CLINICAL DATA:  Unresponsive following CPR, history of respiratory and renal failure  EXAM: CT HEAD WITHOUT CONTRAST  TECHNIQUE: Contiguous axial images were obtained from the base of the skull through the vertex without intravenous contrast.  COMPARISON:  05/25/2014  FINDINGS: The bony calvarium is intact.  No findings to suggest acute hemorrhage, acute infarction or space-occupying mass lesion are noted.  IMPRESSION: No acute intracranial abnormality seen.   Electronically Signed   By: Inez Catalina M.D.   On: 09/25/2014 11:40   Dg Chest Port 1 View  09/26/2014   CLINICAL DATA:  ARDS .  EXAM: PORTABLE CHEST - 1 VIEW  COMPARISON:  09/25/2014.  FINDINGS: Endotracheal tube, NG tube, bilateral IJ lines in stable position. Mediastinum and hilar structures are normal. Heart size normal. Persistent mild bibasilar subsegmental atelectasis. Persistent left lower lobe infiltrate. No pleural effusion or pneumothorax. No acute osseus abnormality.  IMPRESSION: 1. Lines and tubes in stable position. 2. Persistent bibasilar subsegmental  atelectasis with mild infiltrate in the left lung base.   Electronically Signed   By: Marcello Moores  Register   On: 09/26/2014 07:10   Dg Chest Portable 1 View  09/25/2014   CLINICAL DATA:  Respiratory failure.  EXAM: PORTABLE CHEST - 1 VIEW  COMPARISON:  09/24/2014.  FINDINGS: Endotracheal tube, NG tube, bilateral IJ lines in stable position. Mediastinum hilar structures are unremarkable. Heart size normal.  Pulmonary vascularity normal. Low lung volumes with mild basilar atelectasis. Continued clearing of pulmonary infiltrates consistent clearing pneumonia. No pneumothorax. No acute osseus abnormality.  IMPRESSION: 1. Lines and tubes in stable position. 2. Interim continued clearing of pulmonary infiltrates with mild bibasilar atelectasis.   Electronically Signed   By: Marcello Moores  Register   On: 09/25/2014 07:27    Medications:  Scheduled: . ampicillin-sulbactam (UNASYN) IV  1.5 g Intravenous Q12H  . antiseptic oral rinse  7 mL Mouth Rinse QID  . chlorhexidine  15 mL Mouth Rinse BID  . feeding supplement (PRO-STAT SUGAR FREE 64)  30 mL Oral BID  . free water  250 mL Per Tube Q4H  . heparin subcutaneous  5,000 Units Subcutaneous 3 times per day  . insulin aspart  0-20 Units Subcutaneous 6 times per day  . ipratropium-albuterol  3 mL Nebulization Q6H  . labetalol  200 mg Per Tube BID  . midazolam  1 mg Intravenous Q6H  . pantoprazole sodium  40 mg Per Tube Daily   Continuous: . dextrose 5 % with KCl 20 mEq / L    . feeding supplement (VITAL HIGH PROTEIN) 1,000 mL (09/26/14 0800)  . fentaNYL infusion INTRAVENOUS Stopped (09/26/14 0900)  . propofol 5 mcg/kg/min (09/26/14 0800)   ALP:FXTKWI chloride, acetaminophen (TYLENOL) oral liquid 160 mg/5 mL, albuterol, fentaNYL, heparin, labetalol, loperamide  Assessment/Plan:  59 yo F with coma following cardiac arrest. Certainly her persistent lack of motor response at this point is concerning for a possible devastating cerebral injury. There are confounders including sepsis. I would favor evaluating further with brain MRI.   Today noted to be febrile with temp 103 and currently on Unasyn.  1) MRI brain  Etta Quill PA-C [facility] [phone]  09/26/2014, 10:13 AM  59 yo F with persistent coma following cardiac arrest. Multiple metabolic confounders. Awaiting MRI.    Roland Rack, MD [facility] [phone]  If 7pm- 7am, please page neurology on call as listed in [place].

## 2014-09-26 NOTE — Progress Notes (Signed)
Patient ID: Jamie Fields, female   DOB: Oct 01, 1955, 59 y.o.   MRN: [id number] S:intubated and sedated O:BP 123/66 mmHg  Pulse 104  Temp(Src) 103 F (39.4 C) (Oral)  Resp 30  Ht 5\' 7"  (1.702 m)  Wt 104.9 kg (231 lb 4.2 oz)  BMI 36.21 kg/m2  SpO2 99%  Intake/Output Summary (Last 24 hours) at 09/26/14 0940 Last data filed at 09/26/14 0913  Gross per 24 hour  Intake 2218.57 ml  Output   3220 ml  Net -1001.43 ml   Intake/Output: I/O last 3 completed shifts: In: 3842.3 [I.V.:1197.3; ZD/GL:8756; IV Piggyback:200] Out: 4890 [EPPIR:5188; CZYSA:630; Stool:500]  Intake/Output this shift:  Total I/O In: 282.3 [I.V.:92.3; NG/GT:140; IV Piggyback:50] Out: 495 [Urine:495] Weight change: 1.3 kg (2 lb 13.9 oz) Gen:WD obese WF intubated and sedated ZSW:FUXNA no rub Resp:rhonchi TFT:DDUKG, soft Ext:+edema   Recent Labs Lab 09/21/14 1659  09/22/14 1730 09/23/14 0420 09/23/14 1730 09/24/14 0413 09/24/14 1715 09/25/14 0428 09/25/14 1134 09/26/14 0459  NA 135  < > 141 141 141 139 139 144  --  151*  K 4.4  < > 3.1* 2.6* 3.1* 3.2* 3.1* 3.7  --  3.4*  CL 106  < > 105 102 104 103 105 106  --  114*  CO2 20  < > 24 28 28 31 27 28   --  26  GLUCOSE 347*  < > 167* 197* 174* 216* 188* 192*  --  166*  BUN 107*  < > 132* 113* 100* 96* 87* 103*  --  132*  CREATININE 3.52*  < > 3.49* 2.79* 2.24* 1.98* 1.67* 1.88*  --  2.39*  ALBUMIN 1.2*  --  1.4* 1.5*  1.5* 1.8* 1.7* 1.9* 1.8* 1.8* 1.8*  CALCIUM 9.7  < > 8.3* 8.0* 8.0* 7.8* 8.0* 8.3*  --  7.7*  PHOS  --   < > 5.5* 4.4 3.5 3.7 3.0 4.7*  --  5.9*  AST 228*  --   --  132*  --   --   --   --  87* 100*  ALT 219*  --   --  202*  --   --   --   --  145* 139*  < > = values in this interval not displayed. Liver Function Tests:  Recent Labs Lab 09/23/14 0420  09/25/14 0428 09/25/14 1134 09/26/14 0459  AST 132*  --   --  87* 100*  ALT 202*  --   --  145* 139*  ALKPHOS 176*  --   --  414* 472*  BILITOT 4.5*  --   --  2.3* 1.8*  PROT 6.8  --    --  6.8 6.4  ALBUMIN 1.5*  1.5*  < > 1.8* 1.8* 1.8*  < > = values in this interval not displayed. No results for input(s): LIPASE, AMYLASE in the last 168 hours.  Recent Labs Lab 09/25/14 1156  AMMONIA 47*   CBC:  Recent Labs Lab 09/20/14 0330  09/22/14 0525 09/23/14 0420 09/24/14 0413 09/25/14 0428 09/26/14 0459  WBC 13.5*  < > 28.7* 40.3* 41.3* 39.2* 23.6*  NEUTROABS 11.5*  --   --   --   --   --  19.9*  HGB 7.4*  < > 7.2* 7.6* 7.7* 7.6* 7.1*  HCT 22.0*  < > 21.3* 22.0* 22.1* 22.6* 21.8*  MCV 78.0  < > 77.7* 76.1* 75.4* 77.1* 81.0  PLT 18*  < > 60* 92* 125* 151 154  < > =  values in this interval not displayed. Cardiac Enzymes:  Recent Labs Lab 09/21/14 1659  TROPONINI 0.28*   CBG:  Recent Labs Lab 09/25/14 1554 09/25/14 1952 09/26/14 0022 09/26/14 0433 09/26/14 0756  GLUCAP 139* 162* 140* 157* 133*    Iron Studies: No results for input(s): IRON, TIBC, TRANSFERRIN, FERRITIN in the last 72 hours. Studies/Results: Ct Head Wo Contrast  09/25/2014   CLINICAL DATA:  Unresponsive following CPR, history of respiratory and renal failure  EXAM: CT HEAD WITHOUT CONTRAST  TECHNIQUE: Contiguous axial images were obtained from the base of the skull through the vertex without intravenous contrast.  COMPARISON:  05/25/2014  FINDINGS: The bony calvarium is intact.  No findings to suggest acute hemorrhage, acute infarction or space-occupying mass lesion are noted.  IMPRESSION: No acute intracranial abnormality seen.   Electronically Signed   By: Inez Catalina M.D.   On: 09/25/2014 11:40   Dg Chest Port 1 View  09/26/2014   CLINICAL DATA:  ARDS .  EXAM: PORTABLE CHEST - 1 VIEW  COMPARISON:  09/25/2014.  FINDINGS: Endotracheal tube, NG tube, bilateral IJ lines in stable position. Mediastinum and hilar structures are normal. Heart size normal. Persistent mild bibasilar subsegmental atelectasis. Persistent left lower lobe infiltrate. No pleural effusion or pneumothorax. No acute  osseus abnormality.  IMPRESSION: 1. Lines and tubes in stable position. 2. Persistent bibasilar subsegmental atelectasis with mild infiltrate in the left lung base.   Electronically Signed   By: Marcello Moores  Register   On: 09/26/2014 07:10   Dg Chest Portable 1 View  09/25/2014   CLINICAL DATA:  Respiratory failure.  EXAM: PORTABLE CHEST - 1 VIEW  COMPARISON:  09/24/2014.  FINDINGS: Endotracheal tube, NG tube, bilateral IJ lines in stable position. Mediastinum hilar structures are unremarkable. Heart size normal. Pulmonary vascularity normal. Low lung volumes with mild basilar atelectasis. Continued clearing of pulmonary infiltrates consistent clearing pneumonia. No pneumothorax. No acute osseus abnormality.  IMPRESSION: 1. Lines and tubes in stable position. 2. Interim continued clearing of pulmonary infiltrates with mild bibasilar atelectasis.   Electronically Signed   By: Marcello Moores  Register   On: 09/25/2014 07:27   . ampicillin-sulbactam (UNASYN) IV  1.5 g Intravenous Q12H  . antiseptic oral rinse  7 mL Mouth Rinse QID  . chlorhexidine  15 mL Mouth Rinse BID  . feeding supplement (PRO-STAT SUGAR FREE 64)  30 mL Oral BID  . free water  250 mL Per Tube Q4H  . heparin subcutaneous  5,000 Units Subcutaneous 3 times per day  . insulin aspart  0-20 Units Subcutaneous 6 times per day  . ipratropium-albuterol  3 mL Nebulization Q6H  . labetalol  200 mg Per Tube BID  . midazolam  1 mg Intravenous Q6H  . pantoprazole sodium  40 mg Per Tube Daily    BMET    Component Value Date/Time   NA 151* 09/26/2014 0459   K 3.4* 09/26/2014 0459   CL 114* 09/26/2014 0459   CO2 26 09/26/2014 0459   GLUCOSE 166* 09/26/2014 0459   BUN 132* 09/26/2014 0459   CREATININE 2.39* 09/26/2014 0459   CALCIUM 7.7* 09/26/2014 0459   GFRNONAA 21* 09/26/2014 0459   GFRAA 24* 09/26/2014 0459   CBC    Component Value Date/Time   WBC 23.6* 09/26/2014 0459   RBC 2.69* 09/26/2014 0459   HGB 7.1* 09/26/2014 0459   HCT 21.8*  09/26/2014 0459   PLT 154 09/26/2014 0459   MCV 81.0 09/26/2014 0459   MCH 26.4 09/26/2014 0459  MCHC 32.6 09/26/2014 0459   RDW 18.7* 09/26/2014 0459   LYMPHSABS 2.8 09/26/2014 0459   MONOABS 0.9 09/26/2014 0459   EOSABS 0.0 09/26/2014 0459   BASOSABS 0.0 09/26/2014 0459     Assessment/Plan:  1. ARF in setting of sepsis and need for pressors- currently off CVVHD and continues to have increased UOP. BP's have improved and she is off pressors, with negative 1 liter over the last 24 hours and 17 liters over the last 72 hrs.  1. Will continue hold cvvhd and lasix today and follow UOP and daily labs as she may require IHD. 2. VDRF- per PCCM 3. Group A strep sepsis- on antibiotics and off pressors. Also with erythema of right femoral area, CT scan was negative for necrotizing fasciitis. Cont with antibiotics and supportive care. 4. Volume overload- as above, marked diuresis, ok to hold CVVHD for now and follow since she has had good UOP. 5. Hypernatremia- worsening despite starting free water boluses yesterday  1. Free water deficit ~ 4 liters, will start D5W with 77mEq KCl and follow Na levels. 2. Continue with free water boluses via OGT at 250cc q4 6. Anemia- transfuse prn. 7. Protein malnutrition.  [place] A

## 2014-09-26 NOTE — Progress Notes (Signed)
MRI talked to pt's son about her past surgical history

## 2014-09-26 NOTE — Progress Notes (Signed)
PULMONARY / CRITICAL CARE MEDICINE   Name: Jamie Fields MRN: [id number] DOB: [birth year]    ADMISSION DATE:  09/17/2014 CONSULTATION DATE:  09/17/2014   REFERRING MD :  [facility]  CHIEF COMPLAINT:  Respiratory Distress  INITIAL PRESENTATION: 59 y.o. F who was taken to [facility] ED early AM hours 2/22 for respiratory distress.  She was admitted and placed on BiPAP.  Was also found to have acute renal failure, AG metabolic acidosis, Overnight, her acidosis and respiratory status worsened to the point she required intubation.  She was also placed on levophed for persistent shock.  Later in day 2/22 was transferred to [facility] for further management.  STUDIES:  CT Chest 2/22 >> no acute process CT abd / pelv 2/22 >> iliac lymph nodes likely reactive.  No acute process CXR 2/23  >> Persistent mild vascular congestion and mild interstitial edema bilaterally. Superimposed infiltrate right upper lobe and left base cannot be excluded. Echo 6/14 >> Systolic function was normal. EF 60% to 65%. Right Tib/Fib DG >> No evidence of osteo; Mild soft tissue swelling RUQ [place] 2/23 >> Liver echogenicity is diffusely increased and somewhat heterogeneous. Most likely are indicative of hepatic steatosis CT tib/fib Rt >> Negative for osteomyelitis or necrotizing fasciitis. Superficial edema and blistering CXR 2/28 >> No change in bilateral airspace disease opacities representing multifocal pneumonia versus asymmetric edema 3/1 ct head>>>neg 3/1 eeg>>slow, enceph 3/2RI brain>>>  SIGNIFICANT EVENTS: 2/22  Admitted at [facility]; respiratory failure-intubated; shock; transferred to [facility] 2/23  On [place]; intubated; Fever, CXR infiltrate = sepsis 2/24  Group A strep, added Clinda; CT neg for nec fas; Tube feeds started 2/26  Hypoxia, brady arrest/ PEA in pm  2/27  CVVHD initiated  2/28  Pt net neg 6L; New CXR infiltrate - Vanc restarted 2/29- cvvhd off, urine output increased   SUBJECTIVE: HTN, vent  dyschrony  VITAL SIGNS: Temp:  [98.6 F (37 C)-103 F (39.4 C)] 102.3 F (39.1 C) (03/02 1146) Pulse Rate:  [92-116] 92 (03/02 1200) Resp:  [15-40] 35 (03/02 1200) BP: (94-187)/(48-124) 127/59 mmHg (03/02 1200) SpO2:  [94 %-100 %] 99 % (03/02 1200) Arterial Line BP: (85-165)/(41-69) 137/58 mmHg (03/02 1200) FiO2 (%):  [40 %] 40 % (03/02 1100) Weight:  [104.9 kg (231 lb 4.2 oz)] 104.9 kg (231 lb 4.2 oz) (03/02 0424)   HEMODYNAMICS:     VENTILATOR SETTINGS: Vent Mode:  [-] PRVC FiO2 (%):  [40 %] 40 % Set Rate:  [18 bmp] 18 bmp Vt Set:  [490 mL] 490 mL PEEP:  [5 cmH20] 5 cmH20 Plateau Pressure:  [23 cmH20-32 cmH20] 32 cmH20   INTAKE / OUTPUT:  Intake/Output Summary (Last 24 hours) at 09/26/14 1302 Last data filed at 09/26/14 1200  Gross per 24 hour  Intake 2536.25 ml  Output   3770 ml  Net -1233.75 ml   PHYSICAL EXAMINATION: General: Obese female, in NAD Neuro: of fent Rass -4 HEENT: [place]/AT. PERRL 3 mm Cardiovascular: RRR, no M/R/G.  Lungs: slight coarse Abdomen: Obese, BS x 4, soft, NT/ND.  Musculoskeletal: edema improved Skin: RLE dressing c/d/i, bulla on R inner thigh ruptured  LABS:  CBC  Recent Labs Lab 09/24/14 0413 09/25/14 0428 09/26/14 0459  WBC 41.3* 39.2* 23.6*  HGB 7.7* 7.6* 7.1*  HCT 22.1* 22.6* 21.8*  PLT 125* 151 154   Coag's  Recent Labs Lab 09/20/14 0610 09/21/14 1659  APTT 37  --   INR 1.30 1.39   BMET  Recent Labs Lab  09/24/14 1715 09/25/14 0428 09/26/14 0459  NA 139 144 151*  K 3.1* 3.7 3.4*  CL 105 106 114*  CO2 27 28 26   BUN 87* 103* 132*  CREATININE 1.67* 1.88* 2.39*  GLUCOSE 188* 192* 166*   Electrolytes  Recent Labs Lab 09/24/14 0413 09/24/14 1715 09/25/14 0428 09/26/14 0459  CALCIUM 7.8* 8.0* 8.3* 7.7*  MG 2.3  --  2.1 2.1  PHOS 3.7 3.0 4.7* 5.9*     Sepsis Markers  Recent Labs Lab 09/21/14 1659 09/21/14 1959 09/24/14 0937  LATICACIDVEN 3.3* 1.4 1.6     ABG  Recent Labs Lab  09/23/14 0410 09/24/14 0943 09/25/14 0335  PHART 7.312* 7.393 7.401  PCO2ART 50.9* 45.2* 43.5  PO2ART 68.2* 115.0* 97.2   Liver Enzymes  Recent Labs Lab 09/23/14 0420  09/25/14 0428 09/25/14 1134 09/26/14 0459  AST 132*  --   --  87* 100*  ALT 202*  --   --  145* 139*  ALKPHOS 176*  --   --  414* 472*  BILITOT 4.5*  --   --  2.3* 1.8*  ALBUMIN 1.5*  1.5*  < > 1.8* 1.8* 1.8*  < > = values in this interval not displayed. Cardiac Enzymes  Recent Labs Lab 09/21/14 1659  TROPONINI 0.28*   Glucose  Recent Labs Lab 09/25/14 1554 09/25/14 1952 09/26/14 0022 09/26/14 0433 09/26/14 0756 09/26/14 1140  GLUCAP 139* 162* 140* 157* 133* 182*    Imaging Ct Head Wo Contrast  09/25/2014   CLINICAL DATA:  Unresponsive following CPR, history of respiratory and renal failure  EXAM: CT HEAD WITHOUT CONTRAST  TECHNIQUE: Contiguous axial images were obtained from the base of the skull through the vertex without intravenous contrast.  COMPARISON:  05/25/2014  FINDINGS: The bony calvarium is intact.  No findings to suggest acute hemorrhage, acute infarction or space-occupying mass lesion are noted.  IMPRESSION: No acute intracranial abnormality seen.   Electronically Signed   By: Inez Catalina M.D.   On: 09/25/2014 11:40   Dg Chest Portable 1 View  09/25/2014   CLINICAL DATA:  Respiratory failure.  EXAM: PORTABLE CHEST - 1 VIEW  COMPARISON:  09/24/2014.  FINDINGS: Endotracheal tube, NG tube, bilateral IJ lines in stable position. Mediastinum hilar structures are unremarkable. Heart size normal. Pulmonary vascularity normal. Low lung volumes with mild basilar atelectasis. Continued clearing of pulmonary infiltrates consistent clearing pneumonia. No pneumothorax. No acute osseus abnormality.  IMPRESSION: 1. Lines and tubes in stable position. 2. Interim continued clearing of pulmonary infiltrates with mild bibasilar atelectasis.   Electronically Signed   By: Marcello Moores  Register   On: 09/25/2014 07:27    ASSESSMENT / PLAN:  PULMONARY OETT 2/22 >>> A: Acute hypoxic respiratory failure COPD without evidence of exacerbation ARDS P:   fent needed for dyschrony Attempt SBT, cpap5 ps 5, goal 2 hr - failed fast Need to assess neuro prognostication with MRI, then trach if neg pcxr resolving with neg balance Dc a line  CARDIOVASCULAR CVL R fem 2/22 >> 2/23 CVL L IJ 2/23 >> Aline L rad 2/23 >> A:  Shock - Septic +/-  Hypovolemic Lactic Acidosis - resolved 2/25 Hx HTN, active  Adrenal Insufficiency - unfortunately received steroids before level, so empirically treat Sinus tachy P:  Add IBV prn labetalol Keep oral labetalol  Assess neuro status driving BP  RENAL A:   Mixed metabolic acidosis - due to lactate & renal failure Acute renal failure - Baseline Cr unknown, no hydro on CT.  UA, urine na, osm: Granular cast; FENa 0.36 % Rhabdo - improving with IVF resuscitation Pseudohypocalcemia - corrects to wnl P:   Poor candidate for HD again in future d5w started to correct na k supp  GASTROINTESTINAL A:  Transaminitis - Improving GERD Obesity  Severe protein calorie malnutrition  diarhea P:   Trend LFT's in am  Pantoprazole. Tube feeds started 2/24 + SSI imodium improved  HEMATOLOGIC A:   Anemia VTE prophylaxis Thrombocytopenia - improving (after diuresis)- hemoconcetrating P:  Transfuse per ICU guidelines. DVT proph:  SCD's CBC in AM as will continue to have pos balance sub q hep  INFECTIOUS A:   Group A Strep Septic Shock P:   BCx2 2/22 >>NG Resp sputum >>> RARE GROUP A STREP (S.PYOGENES) ISOLATED Sputum 2/28- NF  Levofloxacin 2/23>>>2/24 Clinda 2/24 >> 2/26 Vanc, start date 2/22>>>2/26 ---- Restarted 2/28 >>2/29 Zosyn, start date 2/22>>>2/26 Unasyn 2/26 >> IVIG 2/24 >> 2/25  Fever neuro? Re add vanc, ceftaz Dc unasyn UA BC Dc aline   ENDOCRINE A:   R/o rel AI - cortisol 58 but had received dose IV  R/o sick euthryoid - TSH low: 0.258,   T3 Low (37); free T4 low (0.74)  P:   SSI while on tube feeds  NEUROLOGIC A:   Acute metabolic encephalopathy R/o anoxia post arrest Hx Depression, Anxiety, Pain Vent dyschrony P:   Sedation:  Fentanyl gtt / Versed gtt RASS goal: 0 MRi brain  FAMILY  - Updates: family updated 2/28 am.  They are planning a trip and will be leaving the country on 3/1 depending on patients status.  Will need to discuss possibility of trach prior to leaving.  They are aware she may need this and are agreeable to procedure. Will need further discussion / consent via son.    - Inter-disciplinary family meet or Palliative Care meeting due by:  2/28- done 3/1 meeting son   Ccm time 11 min   [name]. Titus Mould, MD, [place] Pgr: [facility]

## 2014-09-26 NOTE — Progress Notes (Signed)
Pt has been desating to the low 80s. RT is notified and is working with patient

## 2014-09-26 NOTE — Progress Notes (Signed)
Patient having continued drops in O2 sats and increased RR.  Lavaged, bagged and suctioned patient and was able to suction out large, brown mucous plugs.  Increased FIO2 to 50% and had to lavage and bag another time to retrieve plugs.  Left FIO2 on 50% will monitor and wean back down as tolerated.

## 2014-09-26 NOTE — Progress Notes (Signed)
Unable to get in touch with pt's son to obtain/clarify pt's surgical history for MRI. MD notification in process. Will attempt to call again.

## 2014-09-27 ENCOUNTER — Inpatient Hospital Stay (HOSPITAL_COMMUNITY): Payer: Commercial Managed Care - HMO

## 2014-09-27 LAB — CBC WITH DIFFERENTIAL/PLATELET
BASOS ABS: 0 10*3/uL (ref 0.0–0.1)
BASOS PCT: 0 % (ref 0–1)
Eosinophils Absolute: 0 10*3/uL (ref 0.0–0.7)
Eosinophils Relative: 0 % (ref 0–5)
HEMATOCRIT: 19.9 % — AB (ref 36.0–46.0)
Hemoglobin: 6.4 g/dL — CL (ref 12.0–15.0)
Lymphocytes Relative: 10 % — ABNORMAL LOW (ref 12–46)
Lymphs Abs: 1.4 10*3/uL (ref 0.7–4.0)
MCH: 27.1 pg (ref 26.0–34.0)
MCHC: 32.2 g/dL (ref 30.0–36.0)
MCV: 84.3 fL (ref 78.0–100.0)
Monocytes Absolute: 0.6 10*3/uL (ref 0.1–1.0)
Monocytes Relative: 4 % (ref 3–12)
NEUTROS ABS: 12.3 10*3/uL — AB (ref 1.7–7.7)
Neutrophils Relative %: 86 % — ABNORMAL HIGH (ref 43–77)
PLATELETS: 142 10*3/uL — AB (ref 150–400)
RBC: 2.36 MIL/uL — ABNORMAL LOW (ref 3.87–5.11)
RDW: 20.6 % — AB (ref 11.5–15.5)
WBC: 14.3 10*3/uL — ABNORMAL HIGH (ref 4.0–10.5)

## 2014-09-27 LAB — COMPREHENSIVE METABOLIC PANEL
ALK PHOS: 304 U/L — AB (ref 39–117)
ALT: 102 U/L — ABNORMAL HIGH (ref 0–35)
AST: 61 U/L — AB (ref 0–37)
Albumin: 1.7 g/dL — ABNORMAL LOW (ref 3.5–5.2)
Anion gap: 12 (ref 5–15)
BUN: 108 mg/dL — ABNORMAL HIGH (ref 6–23)
CALCIUM: 8 mg/dL — AB (ref 8.4–10.5)
CO2: 25 mmol/L (ref 19–32)
Chloride: 116 mmol/L — ABNORMAL HIGH (ref 96–112)
Creatinine, Ser: 1.88 mg/dL — ABNORMAL HIGH (ref 0.50–1.10)
GFR calc non Af Amer: 28 mL/min — ABNORMAL LOW (ref >90)
GFR, EST AFRICAN AMERICAN: 33 mL/min — AB (ref >90)
GLUCOSE: 213 mg/dL — AB (ref 70–99)
POTASSIUM: 3.1 mmol/L — AB (ref 3.5–5.1)
SODIUM: 153 mmol/L — AB (ref 135–145)
TOTAL PROTEIN: 6.4 g/dL (ref 6.0–8.3)
Total Bilirubin: 1.4 mg/dL — ABNORMAL HIGH (ref 0.3–1.2)

## 2014-09-27 LAB — GLUCOSE, CAPILLARY
GLUCOSE-CAPILLARY: 190 mg/dL — AB (ref 70–99)
GLUCOSE-CAPILLARY: 219 mg/dL — AB (ref 70–99)
Glucose-Capillary: 156 mg/dL — ABNORMAL HIGH (ref 70–99)
Glucose-Capillary: 219 mg/dL — ABNORMAL HIGH (ref 70–99)
Glucose-Capillary: 153 mg/dL — ABNORMAL HIGH (ref 70–99)
Glucose-Capillary: 208 mg/dL — ABNORMAL HIGH (ref 70–99)

## 2014-09-27 LAB — CBC
HCT: 23.4 % — ABNORMAL LOW (ref 36.0–46.0)
HEMOGLOBIN: 7.5 g/dL — AB (ref 12.0–15.0)
MCH: 27.8 pg (ref 26.0–34.0)
MCHC: 32.1 g/dL (ref 30.0–36.0)
MCV: 86.7 fL (ref 78.0–100.0)
Platelets: 126 10*3/uL — ABNORMAL LOW (ref 150–400)
RBC: 2.7 MIL/uL — ABNORMAL LOW (ref 3.87–5.11)
RDW: 20.6 % — ABNORMAL HIGH (ref 11.5–15.5)
WBC: 12.1 10*3/uL — ABNORMAL HIGH (ref 4.0–10.5)

## 2014-09-27 LAB — BASIC METABOLIC PANEL
ANION GAP: 9 (ref 5–15)
ANION GAP: 3 — AB (ref 5–15)
BUN: 99 mg/dL — ABNORMAL HIGH (ref 6–23)
BUN: 90 mg/dL — ABNORMAL HIGH (ref 6–23)
CALCIUM: 8.1 mg/dL — AB (ref 8.4–10.5)
CALCIUM: 8.3 mg/dL — AB (ref 8.4–10.5)
CO2: 29 mmol/L (ref 19–32)
CO2: 29 mmol/L (ref 19–32)
Chloride: 113 mmol/L — ABNORMAL HIGH (ref 96–112)
Chloride: 116 mmol/L — ABNORMAL HIGH (ref 96–112)
Creatinine, Ser: 1.73 mg/dL — ABNORMAL HIGH (ref 0.50–1.10)
Creatinine, Ser: 1.6 mg/dL — ABNORMAL HIGH (ref 0.50–1.10)
GFR calc Af Amer: 36 mL/min — ABNORMAL LOW (ref >90)
GFR calc Af Amer: 40 mL/min — ABNORMAL LOW (ref >90)
GFR calc non Af Amer: 34 mL/min — ABNORMAL LOW (ref >90)
GFR, EST NON AFRICAN AMERICAN: 31 mL/min — AB (ref >90)
Glucose, Bld: 208 mg/dL — ABNORMAL HIGH (ref 70–99)
Glucose, Bld: 255 mg/dL — ABNORMAL HIGH (ref 70–99)
POTASSIUM: 3.2 mmol/L — AB (ref 3.5–5.1)
Potassium: 3.2 mmol/L — ABNORMAL LOW (ref 3.5–5.1)
SODIUM: 151 mmol/L — AB (ref 135–145)
Sodium: 148 mmol/L — ABNORMAL HIGH (ref 135–145)

## 2014-09-27 LAB — PHOSPHORUS: Phosphorus: 7.2 mg/dL — ABNORMAL HIGH (ref 2.3–4.6)

## 2014-09-27 LAB — PREPARE RBC (CROSSMATCH)

## 2014-09-27 LAB — TRIGLYCERIDES: Triglycerides: 135 mg/dL (ref <150)

## 2014-09-27 LAB — MAGNESIUM: MAGNESIUM: 2.1 mg/dL (ref 1.5–2.5)

## 2014-09-27 MED ORDER — POTASSIUM CL IN DEXTROSE 5% 20 MEQ/L IV SOLN
20.0000 meq | INTRAVENOUS | Status: DC
  Administered 2014-09-27 – 2014-09-29 (×7): 20 meq via INTRAVENOUS
  Filled 2014-09-27 (×15): qty 1000

## 2014-09-27 MED ORDER — SODIUM CHLORIDE 0.9 % IV SOLN
Freq: Once | INTRAVENOUS | Status: AC
  Administered 2014-09-27: 10 mL/h via INTRAVENOUS

## 2014-09-27 MED ORDER — POTASSIUM CHLORIDE 20 MEQ/15ML (10%) PO SOLN
40.0000 meq | Freq: Once | ORAL | Status: AC
  Administered 2014-09-27: 40 meq
  Filled 2014-09-27: qty 30

## 2014-09-27 MED ORDER — VANCOMYCIN HCL 10 G IV SOLR
1250.0000 mg | INTRAVENOUS | Status: DC
  Administered 2014-09-27: 1250 mg via INTRAVENOUS
  Filled 2014-09-27 (×2): qty 1250

## 2014-09-27 MED ORDER — POTASSIUM CHLORIDE CRYS ER 20 MEQ PO TBCR
40.0000 meq | EXTENDED_RELEASE_TABLET | Freq: Once | ORAL | Status: DC
  Filled 2014-09-27: qty 2

## 2014-09-27 MED ORDER — ALTEPLASE 2 MG IJ SOLR
2.0000 mg | Freq: Once | INTRAMUSCULAR | Status: AC
  Administered 2014-09-27: 2 mg
  Filled 2014-09-27: qty 2

## 2014-09-27 NOTE — Progress Notes (Signed)
NUTRITION FOLLOW-UP  INTERVENTION: D/C Prostat 30 ml BID  Vital High Protein at goal rate of 70 ml/h (1680 ml per day) to provide 1680 kcals (70% of estimated needs), 147 gm protein, 1404 ml free water daily.  NUTRITION DIAGNOSIS: Inadequate oral intake related to inability to eat as evidenced by NPO status; ongoing.  Goal: Enteral nutrition to provide 60-70% of estimated calorie needs (22-25 kcals/kg ideal body weight) and 100% of estimated protein needs, based on ASPEN guidelines for hypocaloric, high protein feeding in critically ill obese individuals; ongoing.  Monitor:  TF tolerance/adequacy, weight trend, labs, vent status.  ASSESSMENT: Patient was transferred from [facility] to [facility] on 2/22 with respiratory distress, acute renal failure, AG metabolic acidosis, and shock.  Labs- high Na, Cl, BUN, creatine, Ph, glucose; low K, Ca Patient is currently intubated on ventilator support MV: 10.5 L/min Temp (24hrs), Avg:99.4 F (37.4 C), Min:98.9 F (37.2 C), Max:100.9 F (38.3 C) Propofol: off Per nurse, pt received propofol for three hours last night  Height: Ht Readings from Last 1 Encounters:  09/18/14 5\' 7"  (1.702 m)   Weight: Wt Readings from Last 1 Encounters:  09/27/14 226 lb 10.1 oz (102.8 kg)   Admission weight: 245 lb 2.4 oz (111.2 kg)  BMI:  Body mass index is 35.49 kg/(m^2). obesity class II  Estimated Nutritional Needs: Kcal: 2404 Protein: 140 gm Fluid: >/= 2 L  Skin: no issues  Diet Order: Diet NPO time specified  Intake/Output Summary (Last 24 hours) at 09/27/14 1229 Last data filed at 09/27/14 1200  Gross per 24 hour  Intake 6286.88 ml  Output   4550 ml  Net 1736.88 ml    Last BM: PTA   Labs:   Recent Labs Lab 09/25/14 0428 09/26/14 0459 09/26/14 1500 09/27/14 0450  NA 144 151* 152* 153*  K 3.7 3.4* 3.1* 3.1*  CL 106 114* 116* 116*  CO2 28 26 23 25   BUN 103* 132* 123* 108*  CREATININE 1.88* 2.39* 2.19* 1.88*  CALCIUM  8.3* 7.7* 7.6* 8.0*  MG 2.1 2.1  --  2.1  PHOS 4.7* 5.9* 6.8* 7.2*  GLUCOSE 192* 166* 221* 213*    CBG (last 3)   Recent Labs  09/27/14 0445 09/27/14 0844 09/27/14 1219  GLUCAP 190* 219* 219*    Scheduled Meds: . alteplase  2 mg Intracatheter Once  . antiseptic oral rinse  7 mL Mouth Rinse QID  . cefTAZidime (FORTAZ)  IV  2 g Intravenous Q24H  . chlorhexidine  15 mL Mouth Rinse BID  . feeding supplement (PRO-STAT SUGAR FREE 64)  30 mL Oral BID  . free water  250 mL Per Tube Q4H  . heparin subcutaneous  5,000 Units Subcutaneous 3 times per day  . insulin aspart  0-20 Units Subcutaneous 6 times per day  . ipratropium-albuterol  3 mL Nebulization Q6H  . labetalol  200 mg Per Tube BID  . pantoprazole sodium  40 mg Per Tube Daily  . vancomycin  1,250 mg Intravenous Q24H    Continuous Infusions: . dextrose 5 % with KCl 20 mEq / L 20 mEq (09/27/14 1000)  . feeding supplement (VITAL HIGH PROTEIN) 1,000 mL (09/27/14 0100)  . fentaNYL infusion INTRAVENOUS 50 mcg/hr (09/26/14 1800)  . propofol 5 mcg/kg/min (09/26/14 0800)    Wynona Dove, MS Dietetic Intern Pager: [phone]

## 2014-09-27 NOTE — Progress Notes (Signed)
Patient ID: Jamie Fields, female   DOB: Jul 23, 1956, 59 y.o.   MRN: [id number] S:intubated, able to open and close eyes upon command O:BP 133/47 mmHg  Pulse 76  Temp(Src) 98.9 F (37.2 C) (Oral)  Resp 25  Ht 5\' 7"  (1.702 m)  Wt 102.8 kg (226 lb 10.1 oz)  BMI 35.49 kg/m2  SpO2 100%  Intake/Output Summary (Last 24 hours) at 09/27/14 0843 Last data filed at 09/27/14 0600  Gross per 24 hour  Intake 5479.38 ml  Output   4475 ml  Net 1004.38 ml   Intake/Output: I/O last 3 completed shifts: In: 6823.1 [I.V.:2458.1; Other:385; NG/GT:3330; IV Piggyback:650] Out: 7371 [Urine:5620; Stool:400]  Intake/Output this shift:    Weight change: -2.1 kg (-4 lb 10.1 oz) Gen:WD obese WF intubated CVS:no rub Resp:scattered rhonchi GGY:IRSWN, +BS, soft Ext:+edema   Recent Labs Lab 09/21/14 1659  09/23/14 0420 09/23/14 1730 09/24/14 0413 09/24/14 1715 09/25/14 0428 09/25/14 1134 09/26/14 0459 09/26/14 1500 09/27/14 0450  NA 135  < > 141 141 139 139 144  --  151* 152* 153*  K 4.4  < > 2.6* 3.1* 3.2* 3.1* 3.7  --  3.4* 3.1* 3.1*  CL 106  < > 102 104 103 105 106  --  114* 116* 116*  CO2 20  < > 28 28 31 27 28   --  26 23 25   GLUCOSE 347*  < > 197* 174* 216* 188* 192*  --  166* 221* 213*  BUN 107*  < > 113* 100* 96* 87* 103*  --  132* 123* 108*  CREATININE 3.52*  < > 2.79* 2.24* 1.98* 1.67* 1.88*  --  2.39* 2.19* 1.88*  ALBUMIN 1.2*  < > 1.5*  1.5* 1.8* 1.7* 1.9* 1.8* 1.8* 1.8* 1.7* 1.7*  CALCIUM 9.7  < > 8.0* 8.0* 7.8* 8.0* 8.3*  --  7.7* 7.6* 8.0*  PHOS  --   < > 4.4 3.5 3.7 3.0 4.7*  --  5.9* 6.8* 7.2*  AST 228*  --  132*  --   --   --   --  87* 100*  --  61*  ALT 219*  --  202*  --   --   --   --  145* 139*  --  102*  < > = values in this interval not displayed. Liver Function Tests:  Recent Labs Lab 09/25/14 1134 09/26/14 0459 09/26/14 1500 09/27/14 0450  AST 87* 100*  --  61*  ALT 145* 139*  --  102*  ALKPHOS 414* 472*  --  304*  BILITOT 2.3* 1.8*  --  1.4*  PROT 6.8  6.4  --  6.4  ALBUMIN 1.8* 1.8* 1.7* 1.7*   No results for input(s): LIPASE, AMYLASE in the last 168 hours.  Recent Labs Lab 09/25/14 1156  AMMONIA 47*   CBC:  Recent Labs Lab 09/23/14 0420 09/24/14 0413 09/25/14 0428 09/26/14 0459 09/27/14 0450  WBC 40.3* 41.3* 39.2* 23.6* 14.3*  NEUTROABS  --   --   --  19.9* 12.3*  HGB 7.6* 7.7* 7.6* 7.1* 6.4*  HCT 22.0* 22.1* 22.6* 21.8* 19.9*  MCV 76.1* 75.4* 77.1* 81.0 84.3  PLT 92* 125* 151 154 142*   Cardiac Enzymes:  Recent Labs Lab 09/21/14 1659  TROPONINI 0.28*   CBG:  Recent Labs Lab 09/26/14 1140 09/26/14 1622 09/26/14 1958 09/27/14 0120 09/27/14 0445  GLUCAP 182* 183* 272* 156* 190*    Iron Studies: No results for input(s): IRON, TIBC, TRANSFERRIN, FERRITIN in  the last 72 hours. Studies/Results: Ct Head Wo Contrast  09/25/2014   CLINICAL DATA:  Unresponsive following CPR, history of respiratory and renal failure  EXAM: CT HEAD WITHOUT CONTRAST  TECHNIQUE: Contiguous axial images were obtained from the base of the skull through the vertex without intravenous contrast.  COMPARISON:  05/25/2014  FINDINGS: The bony calvarium is intact.  No findings to suggest acute hemorrhage, acute infarction or space-occupying mass lesion are noted.  IMPRESSION: No acute intracranial abnormality seen.   Electronically Signed   By: Inez Catalina M.D.   On: 09/25/2014 11:40   Mr Brain Wo Contrast  09/27/2014   CLINICAL DATA:  Anoxia, stroke, and altered mental status.  EXAM: MRI HEAD WITHOUT CONTRAST  TECHNIQUE: Multiplanar, multiecho pulse sequences of the brain and surrounding structures were obtained without intravenous contrast.  COMPARISON:  10/01/2011  FINDINGS: Calvarium and upper cervical spine: Chronic calvarial thickening. No focal marrow signal abnormality.  Orbits: No significant findings.  Sinuses: Paranasal sinuses are clear. There is bilateral mucosal thickening or effusions in the mastoid air cells.  Brain: No acute  abnormality such as acute infarct, acute hemorrhage, hydrocephalus, or mass lesion. No evidence of large vessel occlusion.  There is no significant small-vessel ischemic change.  Linear hyperintense FLAIR signal scattered throughout the convexities of the bilateral cerebral hemispheres. No associated gradient signal abnormality or high density on recent head CT to suggest subarachnoid hemorrhage. No gray matter edema or sulcal diffusion abnormality typical of meningitis. The patient is intubated and has a FiO2 a 50%, the most likely cause. If there is any clinical concern for meningitis or subarachnoid hemorrhage, lumbar puncture could be performed.  IMPRESSION: 1. Negative for infarct or other definite abnormality. 2. Sulcal FLAIR hyperintensity likely related to high FIO2 in this intubated patient. Please see discussion above.   Electronically Signed   By: Monte Fantasia M.D.   On: 09/27/2014 03:30   Dg Chest Port 1 View  09/27/2014   CLINICAL DATA:  Sepsis, followup  EXAM: PORTABLE CHEST - 1 VIEW  COMPARISON:  Portable chest x-ray of 09/26/2014  FINDINGS: The chest is overlain by artifacts, limiting assessment. The lungs are not quite as well aerated and there is mild basilar opacity at on the left most consistent with atelectasis. In addition there is some opacity overlying the left medial lung field adjacent the aortic knob, and followup chest x-ray is recommended. The patient is somewhat rotated. The tip of the endotracheal tube is approximately 5.7 cm above the carina. Left IJ central venous line tip overlies the left innominate vein, and right IJ catheter is unchanged in position.  IMPRESSION: 1. Slightly diminished aeration with left basilar atelectasis. 2. Haziness overlying the medial left upper lung field may be due to rotation but recommend followup chest x-ray.   Electronically Signed   By: Ivar Drape M.D.   On: 09/27/2014 07:06   Dg Chest Port 1 View  09/26/2014   CLINICAL DATA:  ARDS .  EXAM:  PORTABLE CHEST - 1 VIEW  COMPARISON:  09/25/2014.  FINDINGS: Endotracheal tube, NG tube, bilateral IJ lines in stable position. Mediastinum and hilar structures are normal. Heart size normal. Persistent mild bibasilar subsegmental atelectasis. Persistent left lower lobe infiltrate. No pleural effusion or pneumothorax. No acute osseus abnormality.  IMPRESSION: 1. Lines and tubes in stable position. 2. Persistent bibasilar subsegmental atelectasis with mild infiltrate in the left lung base.   Electronically Signed   By: Marcello Moores  Register   On: 09/26/2014 07:10   .  sodium chloride   Intravenous Once  . antiseptic oral rinse  7 mL Mouth Rinse QID  . cefTAZidime (FORTAZ)  IV  2 g Intravenous Q24H  . chlorhexidine  15 mL Mouth Rinse BID  . feeding supplement (PRO-STAT SUGAR FREE 64)  30 mL Oral BID  . free water  250 mL Per Tube Q4H  . heparin subcutaneous  5,000 Units Subcutaneous 3 times per day  . insulin aspart  0-20 Units Subcutaneous 6 times per day  . ipratropium-albuterol  3 mL Nebulization Q6H  . labetalol  200 mg Per Tube BID  . pantoprazole sodium  40 mg Per Tube Daily  . potassium chloride  40 mEq Per Tube Once    BMET    Component Value Date/Time   NA 153* 09/27/2014 0450   K 3.1* 09/27/2014 0450   CL 116* 09/27/2014 0450   CO2 25 09/27/2014 0450   GLUCOSE 213* 09/27/2014 0450   BUN 108* 09/27/2014 0450   CREATININE 1.88* 09/27/2014 0450   CALCIUM 8.0* 09/27/2014 0450   GFRNONAA 28* 09/27/2014 0450   GFRAA 33* 09/27/2014 0450   CBC    Component Value Date/Time   WBC 14.3* 09/27/2014 0450   RBC 2.36* 09/27/2014 0450   HGB 6.4* 09/27/2014 0450   HCT 19.9* 09/27/2014 0450   PLT 142* 09/27/2014 0450   MCV 84.3 09/27/2014 0450   MCH 27.1 09/27/2014 0450   MCHC 32.2 09/27/2014 0450   RDW 20.6* 09/27/2014 0450   LYMPHSABS 1.4 09/27/2014 0450   MONOABS 0.6 09/27/2014 0450   EOSABS 0.0 09/27/2014 0450   BASOSABS 0.0 09/27/2014 0450     Assessment/Plan:  1. ARF in  setting of sepsis and need for pressors- currently on CVVHD with marked increase in UOP. BP's have improved and she is off pressors, with negative 3 liters over the last 24 hours and 16 liters over the last 48 hrs.  1. Continue hold cvvhd and lasix today  2. Scr improving off of dialysis with increased UOP most c/w post-ATN diuresis.  Continue to follow UOP and daily labs 3. Hopefully she will not require IHD. 2. VDRF- per PCCM 3. Group A strep sepsis- on antibiotics and off pressors. Also with erythema of right femoral area, CT scan was negative for necrotizing fasciitis. Cont with antibiotics and supportive care. 4. Volume overload- as above, marked diuresis, ok to hold CVVHD for now and follow since she has had good UOP. 5. Hypernatremia- despite free water boluses through OG/NGT at 250cc q4 and D5W at 100cc/hr her serum Na continues to climb.   1. Increase rate of D5@ to 200cc/hr and recheck later today 6. Hypokalemia- added KCl in IVF's and 26mEq per OGT 7. Anemia- transfuse prn. 8. Protein malnutrition. 9. AMS- able to open and close eyes upon demand  Nissim Fleischer A

## 2014-09-27 NOTE — Progress Notes (Signed)
CRITICAL VALUE ALERT  Critical value received:  Hgb 6.4  Date of notification:  09/27/2014  Time of notification:  0642   Critical value read back:Yes.    Nurse who received alert:  Josue Hector RN  MD notified (1st page):  Dr. Nelda Marseille  Time of first page:  0650  MD notified (2nd page):  Time of second page:  Responding MD:  Called direct to elink  Time MD responded:  N/a

## 2014-09-27 NOTE — Progress Notes (Signed)
Transported patient to MRI while on the vent. Patient was changed over to MRI vent, patient remained stable on the during the transport.

## 2014-09-27 NOTE — Progress Notes (Signed)
Subjective: Night nurse reports some improvement.   Exam: Filed Vitals:   09/27/14 0708  BP: 133/47  Pulse: 76  Temp:   Resp: 25   Gen: In bed, NAD MS: Opens eyes spontaneously, looks towards voice. Follows commands to close eyes.  GQ:BVQXI, blinks to threat bilaterally.  Motor: no movement Sensory:no response to nox stim.   Pertinent Labs: Elevated Na  MRI brain - no definite changes.   Impression: 59 yo F with AMS following cardiac arrest. Her improvement since yesterday is encouraging, but this is discordant with lack of motor response. I am concerned that she may have some type cervical spine injury.   Recommendations: 1) MRI brain 2) Given that she is showing improvement, favor continued supportive care.   Roland Rack, MD [facility] [phone]  If 7pm- 7am, please page neurology on call as listed in [place].

## 2014-09-27 NOTE — Progress Notes (Signed)
eLink Physician-Brief Progress Note Patient Name: Jamie Fields DOB: 1956-06-27 MRN: [id number]   Date of Service  09/27/2014  HPI/Events of Note  Hg 6.4, no active bleeding noted.  eICU Interventions  Transfuse 1 unit.        Milanni Ayub 09/27/2014, 6:50 AM

## 2014-09-27 NOTE — Progress Notes (Signed)
ANTIBIOTIC CONSULT NOTE   Pharmacy Consult for vancomycin + ceftaz Indication: GAS bacteremia, pna, fevers  No Known Allergies  Patient Measurements: Height: 5\' 7"  (170.2 cm) Weight: 226 lb 10.1 oz (102.8 kg) IBW/kg (Calculated) : 61.6  Vital Signs: Temp: 98.9 F (37.2 C) (03/03 0443) Temp Source: Oral (03/03 0443) BP: 133/47 mmHg (03/03 0708) Pulse Rate: 76 (03/03 0708) Intake/Output from previous day: 03/02 0701 - 03/03 0700 In: 5561.9 [I.V.:2176.9; NG/GT:2400; IV Piggyback:600] Out: 4520 [Urine:4220; Stool:300] Intake/Output from this shift:    Labs:  Recent Labs  09/25/14 0428 09/26/14 0459 09/26/14 1500 09/27/14 0450  WBC 39.2* 23.6*  --  14.3*  HGB 7.6* 7.1*  --  6.4*  PLT 151 154  --  142*  CREATININE 1.88* 2.39* 2.19* 1.88*   Estimated Creatinine Clearance: 39.7 mL/min (by C-G formula based on Cr of 1.88). No results for input(s): VANCOTROUGH, VANCOPEAK, VANCORANDOM, GENTTROUGH, GENTPEAK, GENTRANDOM, TOBRATROUGH, TOBRAPEAK, TOBRARND, AMIKACINPEAK, AMIKACINTROU, AMIKACIN in the last 72 hours.   Microbiology: Recent Results (from the past 720 hour(s))  MRSA PCR Screening     Status: None   Collection Time: 09/17/14  9:25 PM  Result Value Ref Range Status   MRSA by PCR NEGATIVE NEGATIVE Final    Comment:        The GeneXpert MRSA Assay (FDA approved for NASAL specimens only), is one component of a comprehensive MRSA colonization surveillance program. It is not intended to diagnose MRSA infection nor to guide or monitor treatment for MRSA infections.   Culture, Urine     Status: None   Collection Time: 09/18/14  8:26 AM  Result Value Ref Range Status   Specimen Description URINE, CATHETERIZED  Final   Special Requests Normal  Final   Colony Count NO GROWTH Performed at [facility]   Final   Culture NO GROWTH Performed at [facility]   Final   Report Status 09/19/2014 FINAL  Final  Blood culture (routine x 2)     Status: None    Collection Time: 09/18/14  9:05 AM  Result Value Ref Range Status   Specimen Description BLOOD LEFT HAND  Final   Special Requests BOTTLES DRAWN AEROBIC ONLY [place]  Final   Culture   Final    NO GROWTH 5 DAYS Performed at [facility]    Report Status 09/24/2014 FINAL  Final  Respiratory virus panel (routine influenza)     Status: None   Collection Time: 09/18/14  9:12 AM  Result Value Ref Range Status   Source - RVPAN NOSE  Corrected   Respiratory Syncytial Virus A Negative Negative Final   Respiratory Syncytial Virus B Negative Negative Final   Influenza A Negative Negative Final   Influenza B Negative Negative Final   Parainfluenza 1 Negative Negative Final   Parainfluenza 2 Negative Negative Final   Parainfluenza 3 Negative Negative Final   Metapneumovirus Negative Negative Final   Rhinovirus Negative Negative Final   Adenovirus Negative Negative Final    Comment: (NOTE) Performed At: [facility] [place], [place] [id number] Lindon Romp MD BZ:[id number]   Blood culture (routine x 2)     Status: None   Collection Time: 09/18/14 10:30 AM  Result Value Ref Range Status   Specimen Description BLOOD LEFT CENTRAL LINE  Final   Special Requests BOTTLES DRAWN AEROBIC AND ANAEROBIC 10CC  Final   Culture   Final    NO GROWTH 5 DAYS Performed at [facility]  Report Status 09/24/2014 FINAL  Final  Culture, respiratory (NON-Expectorated)     Status: None   Collection Time: 09/18/14 11:34 AM  Result Value Ref Range Status   Specimen Description TRACHEAL ASPIRATE  Final   Special Requests NONE  Final   Gram Stain   Final    ABUNDANT WBC PRESENT,BOTH PMN AND MONONUCLEAR RARE SQUAMOUS EPITHELIAL CELLS PRESENT RARE YEAST Performed at [facility]    Culture   Final    FEW YEAST CONSISTENT WITH CANDIDA SPECIES RARE GROUP A STREP (S.PYOGENES) ISOLATED Performed at [facility]    Report Status 09/20/2014 FINAL   Final  Blood culture (routine x 2)     Status: None   Collection Time: 09/19/14  5:00 PM  Result Value Ref Range Status   Specimen Description BLOOD RIGHT ARM  Final   Special Requests BOTTLES DRAWN AEROBIC ONLY 10CC  Final   Culture   Final    NO GROWTH 5 DAYS Performed at [facility]    Report Status 09/26/2014 FINAL  Final  Blood culture (routine x 2)     Status: None   Collection Time: 09/19/14  5:20 PM  Result Value Ref Range Status   Specimen Description BLOOD RIGHT HAND  Final   Special Requests BOTTLES DRAWN AEROBIC ONLY 5CC  Final   Culture   Final    NO GROWTH 5 DAYS Performed at [facility]    Report Status 09/26/2014 FINAL  Final  Culture, respiratory (NON-Expectorated)     Status: None   Collection Time: 09/23/14 11:33 AM  Result Value Ref Range Status   Specimen Description ENDOTRACHEAL  Final   Special Requests NONE  Final   Gram Stain   Final    FEW WBC PRESENT,BOTH PMN AND MONONUCLEAR RARE SQUAMOUS EPITHELIAL CELLS PRESENT RARE YEAST Performed at [facility]    Culture   Final    Non-Pathogenic Oropharyngeal-type [place]ora Isolated. Performed at [facility]    Report Status 09/25/2014 FINAL  Final  Clostridium Difficile by PCR     Status: None   Collection Time: 09/24/14  9:28 AM  Result Value Ref Range Status   C difficile by pcr NEGATIVE NEGATIVE Final  Culture, blood (routine x 2)     Status: None (Preliminary result)   Collection Time: 09/26/14  1:50 PM  Result Value Ref Range Status   Specimen Description BLOOD LEFT HAND  Final   Special Requests BOTTLES DRAWN AEROBIC ONLY 5CC  Final   Culture   Final           BLOOD CULTURE RECEIVED NO GROWTH TO DATE CULTURE WILL BE HELD FOR 5 DAYS BEFORE ISSUING A FINAL NEGATIVE REPORT Performed at [facility]    Report Status PENDING  Incomplete  Culture, blood (routine x 2)     Status: None (Preliminary result)   Collection Time: 09/26/14  1:52 PM  Result Value Ref  Range Status   Specimen Description BLOOD RIGHT HAND  Final   Special Requests BOTTLES DRAWN AEROBIC ONLY 5CC  Final   Culture   Final           BLOOD CULTURE RECEIVED NO GROWTH TO DATE CULTURE WILL BE HELD FOR 5 DAYS BEFORE ISSUING A FINAL NEGATIVE REPORT Performed at [facility]    Report Status PENDING  Incomplete    Assessment: 59 y.o. female previously on Unasyn for GAS bacteremia now broadened to ceftazidime and vancomycin due to new fevers  overnight on 3/2. Day #10 of antibiotics, D#2 vanc.ceftaz. Received vancomycin 2g IV load on 3/2. Scr trended down from 2.19 to 1.88. May require HD per renal. WBC 14.3, Tmax/24 99.1, currently afebrile.  Ceftaz 3/2>> 2/22 Vanc >>2/26; 2/28>>2/29; 3/2>> 2/22 Zosyn >>2/26 2/22 Levaquin >>2/24 2/23 Tamiflu >>2/24 2/24 Clindamycin >>2/26 Unasyn 2/26>>3/2  3/2 BCx2 >> ngtd 2/23 MRSA PCR neg 2/23 Flu neg 2/23 Urine >>neg 2/23 TA >>few candida albicans; rare GAS 2/23 Blood >> ngtd [place] blood >> 1/2 GAS (did not conduct sensitivities)  Goal of Therapy:  Resolution of infection  Vancomycin trough Goal 15-20 mcg/ml  Plan:  Begin Vancomycin 1250 mg IV q24h Continue Ceftazidime 2gm IV Q24H F/u renal plans, C&S, clinical status and trough at [place], PharmD Candidate  09/27/2014 8:43 AM  I have reviewed the patient, labs, and notes along with Megan Shah,PharmD Candidate. I agree with her above assessment and plan. Pharmacy will continue to follow.  Thank you for allowing pharmacy to be part of this patient's care team  Tammie Yanda M. Collins Dimaria, Pharm.D Clinical Pharmacy Resident Pager: [phone] 09/27/2014 .10:00 AM

## 2014-09-27 NOTE — Progress Notes (Signed)
eLink Physician-Brief Progress Note Patient Name: Jamie Fields DOB: 1956/06/07 MRN: [id number]   Date of Service  09/27/2014  HPI/Events of Note  Hypokalemia. Potassium = 3.2.  eICU Interventions  Potassium replaced.      Intervention Category Intermediate Interventions: Electrolyte abnormality - evaluation and management  Elena Davia Eugene 09/27/2014, 10:04 PM

## 2014-09-27 NOTE — Progress Notes (Signed)
OG tube would not flush. OG tube removed and discovered to be perforated around the level of the mouth. Patient with increased work of breathing. KUB and CXR ordered. ELink MD notified.

## 2014-09-27 NOTE — Progress Notes (Signed)
PULMONARY / CRITICAL CARE MEDICINE   Name: Jamie Fields MRN: [id number] DOB: 1955-08-09    ADMISSION DATE:  09/17/2014 CONSULTATION DATE:  09/17/2014   REFERRING MD :  [facility]  CHIEF COMPLAINT:  Respiratory Distress  INITIAL PRESENTATION: 59 y.o. F who was taken to [facility] ED early AM hours 2/22 for respiratory distress.  She was admitted and placed on BiPAP.  Was also found to have acute renal failure, AG metabolic acidosis, Overnight, her acidosis and respiratory status worsened to the point she required intubation.  She was also placed on levophed for persistent shock.  Later in day 2/22 was transferred to [facility] for further management.  STUDIES:  CT Chest 2/22 >> no acute process CT abd / pelv 2/22 >> iliac lymph nodes likely reactive.  No acute process CXR 2/23  >> Persistent mild vascular congestion and mild interstitial edema bilaterally. Superimposed infiltrate right upper lobe and left base cannot be excluded. Echo 2/10 >> Systolic function was normal. EF 60% to 65%. Right Tib/Fib DG >> No evidence of osteo; Mild soft tissue swelling RUQ [place] 2/23 >> Liver echogenicity is diffusely increased and somewhat heterogeneous. Most likely are indicative of hepatic steatosis CT tib/fib Rt >> Negative for osteomyelitis or necrotizing fasciitis. Superficial edema and blistering CXR 2/28 >> No change in bilateral airspace disease opacities representing multifocal pneumonia versus asymmetric edema 3/1 ct head>>>neg 3/1 eeg>>slow, enceph 3/2RI brain>>>neg acute, sulci flare, may be related to high fio2  SIGNIFICANT EVENTS: 2/22  Admitted at [place]; respiratory failure-intubated; shock; transferred to [facility] 2/23  On [place]; intubated; Fever, CXR infiltrate = sepsis 2/24  Group A strep, added Clinda; CT neg for nec fas; Tube feeds started 2/26  Hypoxia, brady arrest/ PEA in pm  2/27  CVVHD initiated  2/28  Pt net neg 6L; New CXR infiltrate - Vanc restarted 2/29- cvvhd off, urine  output increased  SUBJECTIVE: MRi done, more awake, followed commands, not moving arms legs  VITAL SIGNS: Temp:  [98.9 F (37.2 C)-100.9 F (38.3 C)] 99.6 F (37.6 C) (03/03 1045) Pulse Rate:  [68-88] 71 (03/03 1118) Resp:  [19-38] 31 (03/03 1118) BP: (109-139)/(40-101) 134/46 mmHg (03/03 1118) SpO2:  [88 %-100 %] 100 % (03/03 1118) Arterial Line BP: (136-161)/(60-64) 161/64 mmHg (03/02 1600) FiO2 (%):  [40 %-50 %] 40 % (03/03 1200) Weight:  [102.8 kg (226 lb 10.1 oz)] 102.8 kg (226 lb 10.1 oz) (03/03 0425)   HEMODYNAMICS: CVP:  [11 mmHg] 11 mmHg   VENTILATOR SETTINGS: Vent Mode:  [-] PRVC FiO2 (%):  [40 %-50 %] 40 % Set Rate:  [18 bmp] 18 bmp Vt Set:  [490 mL] 490 mL PEEP:  [5 cmH20] 5 cmH20 Plateau Pressure:  [22 cmH20-28 cmH20] 22 cmH20   INTAKE / OUTPUT:  Intake/Output Summary (Last 24 hours) at 09/27/14 1222 Last data filed at 09/27/14 1200  Gross per 24 hour  Intake 6286.88 ml  Output   4550 ml  Net 1736.88 ml   PHYSICAL EXAMINATION: General: Obese female, in NAD Neuro: of fent Rass -1, NOT moving arms or  legs HEENT: [place]/AT. PERRL 3 mm Cardiovascular: RRR, no M/R/G.  Lungs: slight coarse Abdomen: Obese, BS x 4, soft, NT/ND.  Musculoskeletal: edema improved Skin: RLE dressing c/d/i, bulla on R inner thigh ruptured  LABS:  CBC  Recent Labs Lab 09/25/14 0428 09/26/14 0459 09/27/14 0450  WBC 39.2* 23.6* 14.3*  HGB 7.6* 7.1* 6.4*  HCT 22.6* 21.8* 19.9*  PLT 151 154 142*  Coag's  Recent Labs Lab 09/21/14 1659  INR 1.39   BMET  Recent Labs Lab 09/26/14 0459 09/26/14 1500 09/27/14 0450  NA 151* 152* 153*  K 3.4* 3.1* 3.1*  CL 114* 116* 116*  CO2 26 23 25   BUN 132* 123* 108*  CREATININE 2.39* 2.19* 1.88*  GLUCOSE 166* 221* 213*   Electrolytes  Recent Labs Lab 09/25/14 0428 09/26/14 0459 09/26/14 1500 09/27/14 0450  CALCIUM 8.3* 7.7* 7.6* 8.0*  MG 2.1 2.1  --  2.1  PHOS 4.7* 5.9* 6.8* 7.2*     Sepsis Markers  Recent  Labs Lab 09/21/14 1659 09/21/14 1959 09/24/14 0937  LATICACIDVEN 3.3* 1.4 1.6     ABG  Recent Labs Lab 09/23/14 0410 09/24/14 0943 09/25/14 0335  PHART 7.312* 7.393 7.401  PCO2ART 50.9* 45.2* 43.5  PO2ART 68.2* 115.0* 97.2   Liver Enzymes  Recent Labs Lab 09/25/14 1134 09/26/14 0459 09/26/14 1500 09/27/14 0450  AST 87* 100*  --  61*  ALT 145* 139*  --  102*  ALKPHOS 414* 472*  --  304*  BILITOT 2.3* 1.8*  --  1.4*  ALBUMIN 1.8* 1.8* 1.7* 1.7*   Cardiac Enzymes  Recent Labs Lab 09/21/14 1659  TROPONINI 0.28*   Glucose  Recent Labs Lab 09/26/14 1140 09/26/14 1622 09/26/14 1958 09/27/14 0120 09/27/14 0445 09/27/14 0844  GLUCAP 182* 183* 272* 156* 190* 219*    Imaging Dg Chest Port 1 View  09/26/2014   CLINICAL DATA:  ARDS .  EXAM: PORTABLE CHEST - 1 VIEW  COMPARISON:  09/25/2014.  FINDINGS: Endotracheal tube, NG tube, bilateral IJ lines in stable position. Mediastinum and hilar structures are normal. Heart size normal. Persistent mild bibasilar subsegmental atelectasis. Persistent left lower lobe infiltrate. No pleural effusion or pneumothorax. No acute osseus abnormality.  IMPRESSION: 1. Lines and tubes in stable position. 2. Persistent bibasilar subsegmental atelectasis with mild infiltrate in the left lung base.   Electronically Signed   By: Marcello Moores  Register   On: 09/26/2014 07:10   ASSESSMENT / PLAN:  PULMONARY OETT 2/22 >>> A: Acute hypoxic respiratory failure COPD without evidence of exacerbation ARDS P:   Weaning this am cpap 5 ps 5, goal 2 hr Will consider trach in am given neuro improvements pcxr noted Pos balance needed  CARDIOVASCULAR CVL R fem 2/22 >> 2/23 CVL L IJ 2/23 >> Aline L rad 2/23 >> A:  Shock - Septic +/-  Hypovolemic Lactic Acidosis - resolved 2/25 Hx HTN, active  Adrenal Insufficiency - unfortunately received steroids before level, so empirically treat Sinus tachy P:  prn labetalol Same dose planned oral labetalol    RENAL A:   Mixed metabolic acidosis - due to lactate & renal failure Acute renal failure -residual ATN diuresis Rhabdo - improving with IVF resuscitation Pseudohypocalcemia - corrects to wnl P:   d5w increase Free water k supp q8h chem  With NA  GASTROINTESTINAL A:  Transaminitis - Improving GERD Obesity  Severe protein calorie malnutrition  diarhea P:   Trend LFT's in am  Pantoprazole. Tube feeds started 2/24 + SSI imodium increase  HEMATOLOGIC A:   Anemia VTE prophylaxis Thrombocytopenia - improving (after diuresis)- hemoconcentration Anemia P:  Transfuse 1 unit, cbc follow up Dilution drop to hgb sub q hep maintain  INFECTIOUS A:   Group A Strep Septic Shock P:   BCx2 2/22 >>NG Resp sputum >>> RARE GROUP A STREP (S.PYOGENES) ISOLATED Sputum 2/28- NF BC 3/2>>>  Levofloxacin 2/23>>>2/24 Clinda 2/24 >> 2/26 Vanc, start  date 2/22>>>2/26 ---- Restarted 2/28 >>2/29 Zosyn, start date 2/22>>>2/26 Unasyn 2/26 >> IVIG 2/24 >> 2/25  Fever better with ceftaz vanc MRi neck for abscess STAT UA neg  ENDOCRINE A:   R/o rel AI - cortisol 58 but had received dose IV  R/o sick euthryoid - TSH low: 0.258,  T3 Low (37); free T4 low (0.74)  P:   SSI while on tube feeds  NEUROLOGIC A:   Acute metabolic encephalopathy R/o anoxia post arrest Hx Depression, Anxiety, Pain Vent dyschrony P:   Sedation:  Fentanyl gtt with WUA RASS goal: 0 MRi brain done, neg For MRI neck stat r/o abscess  FAMILY  - Updates: family updated 2/28 am.  They are planning a trip and will be leaving the country on 3/1 depending on patients status.  Will need to discuss possibility of trach prior to leaving.  They are aware she may need this and are agreeable to procedure. Will need further discussion / consent via son.   Will call son today trach needed update  - Inter-disciplinary family meet or Palliative Care meeting due by:  2/28- done 3/1 meeting son   Ccm time 76 min    [name]. Titus Mould, MD, [place] Pgr: [place] [facility]

## 2014-09-27 NOTE — Progress Notes (Signed)
  RN noticed hole in patient NG tube. New NG Placed Also patient restless  CXR - no obvious aspiration. ET tube  High 6.5cm KUB - ok   Plan Restart diprivan Advance et tube Monitor   Dr. Brand Males, M.D., [facility].C.P Pulmonary and Critical Care Medicine Staff Physician [place] [facility] Pager: [phone], If no answer or between  15:00h - 7:00h: call [phone]  09/27/2014 11:44 PM

## 2014-09-27 NOTE — Progress Notes (Signed)
UR Completed.  [phone]  

## 2014-09-28 ENCOUNTER — Inpatient Hospital Stay (HOSPITAL_COMMUNITY): Payer: Commercial Managed Care - HMO

## 2014-09-28 ENCOUNTER — Encounter (HOSPITAL_COMMUNITY): Payer: Self-pay | Admitting: Radiology

## 2014-09-28 DIAGNOSIS — E46 Unspecified protein-calorie malnutrition: Secondary | ICD-10-CM | POA: Insufficient documentation

## 2014-09-28 LAB — CBC
HCT: 23 % — ABNORMAL LOW (ref 36.0–46.0)
HEMOGLOBIN: 7.3 g/dL — AB (ref 12.0–15.0)
MCH: 27.8 pg (ref 26.0–34.0)
MCHC: 31.7 g/dL (ref 30.0–36.0)
MCV: 87.5 fL (ref 78.0–100.0)
Platelets: 129 10*3/uL — ABNORMAL LOW (ref 150–400)
RBC: 2.63 MIL/uL — AB (ref 3.87–5.11)
RDW: 20.4 % — ABNORMAL HIGH (ref 11.5–15.5)
WBC: 12.3 10*3/uL — AB (ref 4.0–10.5)

## 2014-09-28 LAB — RENAL FUNCTION PANEL
Albumin: 1.7 g/dL — ABNORMAL LOW (ref 3.5–5.2)
Anion gap: 4 — ABNORMAL LOW (ref 5–15)
BUN: 78 mg/dL — AB (ref 6–23)
CO2: 27 mmol/L (ref 19–32)
CREATININE: 1.49 mg/dL — AB (ref 0.50–1.10)
Calcium: 8.2 mg/dL — ABNORMAL LOW (ref 8.4–10.5)
Chloride: 116 mmol/L — ABNORMAL HIGH (ref 96–112)
GFR calc Af Amer: 43 mL/min — ABNORMAL LOW (ref >90)
GFR calc non Af Amer: 37 mL/min — ABNORMAL LOW (ref >90)
GLUCOSE: 171 mg/dL — AB (ref 70–99)
PHOSPHORUS: 6.1 mg/dL — AB (ref 2.3–4.6)
Potassium: 4.1 mmol/L (ref 3.5–5.1)
SODIUM: 147 mmol/L — AB (ref 135–145)

## 2014-09-28 LAB — GLUCOSE, CAPILLARY
GLUCOSE-CAPILLARY: 141 mg/dL — AB (ref 70–99)
GLUCOSE-CAPILLARY: 154 mg/dL — AB (ref 70–99)
GLUCOSE-CAPILLARY: 158 mg/dL — AB (ref 70–99)
GLUCOSE-CAPILLARY: 187 mg/dL — AB (ref 70–99)
Glucose-Capillary: 128 mg/dL — ABNORMAL HIGH (ref 70–99)
Glucose-Capillary: 146 mg/dL — ABNORMAL HIGH (ref 70–99)

## 2014-09-28 LAB — MAGNESIUM: Magnesium: 1.8 mg/dL (ref 1.5–2.5)

## 2014-09-28 MED ORDER — ETOMIDATE 2 MG/ML IV SOLN
INTRAVENOUS | Status: AC
  Administered 2014-09-28: 20 mg via INTRAVENOUS
  Filled 2014-09-28: qty 10

## 2014-09-28 MED ORDER — FENTANYL CITRATE 0.05 MG/ML IJ SOLN: 25.0000 ug | INTRAMUSCULAR | Status: AC | PRN

## 2014-09-28 MED ORDER — MIDAZOLAM HCL 2 MG/2ML IJ SOLN
2.0000 mg | Freq: Once | INTRAMUSCULAR | Status: AC
  Administered 2014-09-28: 2 mg via INTRAVENOUS

## 2014-09-28 MED ORDER — WHITE PETROLATUM GEL
Status: AC
  Filled 2014-09-28: qty 1

## 2014-09-28 MED ORDER — MIDAZOLAM HCL 2 MG/2ML IJ SOLN
INTRAMUSCULAR | Status: AC
  Administered 2014-09-28: 2 mg via INTRAVENOUS
  Filled 2014-09-28: qty 4

## 2014-09-28 MED ORDER — ETOMIDATE 2 MG/ML IV SOLN
20.0000 mg | Freq: Once | INTRAVENOUS | Status: AC
  Administered 2014-09-28: 20 mg via INTRAVENOUS

## 2014-09-28 MED ORDER — VECURONIUM BROMIDE 10 MG IV SOLR
7.0000 mg | Freq: Once | INTRAVENOUS | Status: AC
  Administered 2014-09-28: 7 mg via INTRAVENOUS

## 2014-09-28 MED ORDER — DEXTROSE 5 % IV SOLN
2.0000 g | Freq: Two times a day (BID) | INTRAVENOUS | Status: DC
  Administered 2014-09-28 – 2014-09-29 (×3): 2 g via INTRAVENOUS
  Filled 2014-09-28 (×4): qty 2

## 2014-09-28 MED ORDER — VECURONIUM BROMIDE 10 MG IV SOLR
INTRAVENOUS | Status: AC
  Administered 2014-09-28: 7 mg via INTRAVENOUS
  Filled 2014-09-28: qty 10

## 2014-09-28 MED ORDER — WHITE PETROLATUM GEL
Status: DC | PRN
  Administered 2014-09-28: 13:00:00 via TOPICAL
  Administered 2014-09-28: 0.2 via TOPICAL
  Administered 2014-10-04: 1 via TOPICAL
  Administered 2014-10-05: 0.2 via TOPICAL
  Filled 2014-09-28: qty 1

## 2014-09-28 NOTE — Progress Notes (Signed)
Transported pt from 2M02 to CT on ventilator. Pt stable throughout with no complications.

## 2014-09-28 NOTE — H&P (Signed)
Chief Complaint: Malnutrition, request for percutaneous gastrostomy tube  Referring Physician(s): PCCM  History of Present Illness: Jamie Fields is a 59 y.o. female with AMS s/p cardiac arrest and respiratory failure requiring intubation. Patient also with group A Strep septic shock and was on pressors and antibiotics, currently off pressors. IR received request for percutaneous gastrostomy tube for malnutrition. History is obtained per chart review since patient is unable to give history and no family present today.   Past Medical History  Diagnosis Date  . COPD (chronic obstructive pulmonary disease)   . GERD (gastroesophageal reflux disease)   . Asthma   . HTN (hypertension)   . Depression   . Anxiety    History reviewed. No pertinent past surgical history.  Allergies: Review of patient's allergies indicates no known allergies.  Medications: Prior to Admission medications   Medication Sig Start Date End Date Taking? Authorizing Provider  albuterol (PROVENTIL) (2.5 MG/3ML) 0.083% nebulizer solution Take 2.5 mg by nebulization every 4 (four) hours as needed for wheezing or shortness of breath.  09/05/14  Yes Historical Provider, MD  amitriptyline (ELAVIL) 25 MG tablet Take 50 mg by mouth at bedtime.  09/05/14  Yes Historical Provider, MD  atorvastatin (LIPITOR) 10 MG tablet Take 10 mg by mouth at bedtime.  09/05/14  Yes Historical Provider, MD  diclofenac (VOLTAREN) 75 MG EC tablet Take 75 mg by mouth 2 (two) times daily as needed (pain).  09/05/14  Yes Historical Provider, MD  DULoxetine (CYMBALTA) 30 MG capsule Take 60 mg by mouth daily.  09/05/14  Yes Historical Provider, MD  esomeprazole (NEXIUM) 40 MG capsule Take 40 mg by mouth daily.  09/05/14  Yes Historical Provider, MD  gabapentin (NEURONTIN) 800 MG tablet Take 800 mg by mouth 3 (three) times daily.  09/05/14  Yes Historical Provider, MD  ibuprofen (ADVIL,MOTRIN) 200 MG tablet Take 200 mg by mouth every 4 (four) hours  as needed for fever (pain).   Yes Historical Provider, MD  Isopropyl Alcohol 95 % LIQD Place 4-5 drops in ear(s) as needed (water in ears).   Yes Historical Provider, MD  meclizine (ANTIVERT) 25 MG tablet Take 25 mg by mouth daily.  09/05/14  Yes Historical Provider, MD  morphine (MS CONTIN) 15 MG 12 hr tablet Take 15 mg by mouth every 12 (twelve) hours.  09/12/14  Yes Historical Provider, MD  ondansetron (ZOFRAN) 8 MG tablet Take 8 mg by mouth 3 (three) times daily as needed for nausea or vomiting.   Yes Historical Provider, MD  OPANA ER, CRUSH RESISTANT, 10 MG T12A 12 hr tablet Take 10 mg by mouth 2 (two) times daily.  09/06/14  Yes Historical Provider, MD  oxyCODONE-acetaminophen (PERCOCET) 10-325 MG per tablet Take 1 tablet by mouth every 6 (six) hours as needed for pain.  09/12/14  Yes Historical Provider, MD  oxymetazoline (AFRIN) 0.05 % nasal spray Place 1 spray into both nostrils 2 (two) times daily.   Yes Historical Provider, MD  phenylephrine (NEO-SYNEPHRINE) 0.5 % nasal solution Place 2 drops into both nostrils every 12 (twelve) hours as needed for congestion.   Yes Historical Provider, MD  Phenylephrine-Guaifenesin 5-200 MG TABS Take 2 tablets by mouth every 4 (four) hours as needed (congestion/cough).   Yes Historical Provider, MD  sertraline (ZOLOFT) 100 MG tablet Take 200 mg by mouth daily.  09/05/14  Yes Historical Provider, MD  SUMAtriptan (IMITREX) 50 MG tablet Take 50 mg by mouth every 2 (two) hours as needed for migraine.  Do not exceed 200 mg in 24 hours 09/05/14  Yes Historical Provider, MD  tamsulosin (FLOMAX) 0.4 MG CAPS capsule Take 0.4 mg by mouth daily. 30 minutes after the same meal every day 09/05/14  Yes Historical Provider, MD  traMADol (ULTRAM) 50 MG tablet Take 50-100 mg by mouth 2 (two) times daily. Alternate with oxycodone - do not take together 07/06/14  Yes Historical Provider, MD     Family History  Problem Relation Age of Onset  . Hyperlipidemia Mother   .  Hypertension Father     History   Social History  . Marital Status: Widowed    Spouse Name: N/A  . Number of Children: N/A  . Years of Education: N/A   Social History Main Topics  . Smoking status: Not on file  . Smokeless tobacco: Not on file  . Alcohol Use: Not on file  . Drug Use: Not on file  . Sexual Activity: Not on file   Other Topics Concern  . None   Social History Narrative   Review of Systems Unable to be obtained, patient is intubated.  Vital Signs: BP 137/66 mmHg  Pulse 83  Temp(Src) 97.2 F (36.2 C) (Oral)  Resp 20  Ht 5\' 7"  (1.702 m)  Wt 227 lb 11.8 oz (103.3 kg)  BMI 35.66 kg/m2  SpO2 100% on Vent  Physical Exam General: Intubated, NAD, rectal tube in place Heart: RRR without M/G/R Lungs: CTA Abd: Soft, ND, (+) BS Ext: (+) Pitting edema  Mallampati Score:  MD Evaluation Airway: Other (comments) Airway comments: Intubated Heart: WNL Abdomen: WNL Chest/ Lungs: WNL ASA  Classification: 3 Mallampati/Airway Score:  (Intubated)  Imaging: Ct Head Wo Contrast  09/25/2014   CLINICAL DATA:  Unresponsive following CPR, history of respiratory and renal failure  EXAM: CT HEAD WITHOUT CONTRAST  TECHNIQUE: Contiguous axial images were obtained from the base of the skull through the vertex without intravenous contrast.  COMPARISON:  05/25/2014  FINDINGS: The bony calvarium is intact.  No findings to suggest acute hemorrhage, acute infarction or space-occupying mass lesion are noted.  IMPRESSION: No acute intracranial abnormality seen.   Electronically Signed   By: Inez Catalina M.D.   On: 09/25/2014 11:40   Mr Brain Wo Contrast  09/27/2014   CLINICAL DATA:  Anoxia, stroke, and altered mental status.  EXAM: MRI HEAD WITHOUT CONTRAST  TECHNIQUE: Multiplanar, multiecho pulse sequences of the brain and surrounding structures were obtained without intravenous contrast.  COMPARISON:  10/01/2011  FINDINGS: Calvarium and upper cervical spine: Chronic calvarial  thickening. No focal marrow signal abnormality.  Orbits: No significant findings.  Sinuses: Paranasal sinuses are clear. There is bilateral mucosal thickening or effusions in the mastoid air cells.  Brain: No acute abnormality such as acute infarct, acute hemorrhage, hydrocephalus, or mass lesion. No evidence of large vessel occlusion.  There is no significant small-vessel ischemic change.  Linear hyperintense FLAIR signal scattered throughout the convexities of the bilateral cerebral hemispheres. No associated gradient signal abnormality or high density on recent head CT to suggest subarachnoid hemorrhage. No gray matter edema or sulcal diffusion abnormality typical of meningitis. The patient is intubated and has a FiO2 a 50%, the most likely cause. If there is any clinical concern for meningitis or subarachnoid hemorrhage, lumbar puncture could be performed.  IMPRESSION: 1. Negative for infarct or other definite abnormality. 2. Sulcal FLAIR hyperintensity likely related to high FIO2 in this intubated patient. Please see discussion above.   Electronically Signed   By: Angelica Chessman  Watts M.D.   On: 09/27/2014 03:30   Mr Cervical Spine Wo Contrast  09/28/2014   CLINICAL DATA:  Initial evaluation for quadriparesis. Status post recent cardiac arrest.  EXAM: MRI CERVICAL SPINE WITHOUT CONTRAST  TECHNIQUE: Multiplanar, multisequence MR imaging of the cervical spine was performed. No intravenous contrast was administered.  COMPARISON:  None available.  FINDINGS: Visualized portions of the brain and posterior fossa demonstrate a normal appearance with normal signal intensity. The craniocervical junction is widely patent.  Straightening of the normal cervical lordosis with mild levoscoliosis is present. Vertebral body heights well maintained. Signal intensity within the vertebral body bone marrow is normal.  Signal intensity within the cervical spinal cord is normal. No cord signal changes to suggest cord infarct or other  acute abnormality.  There is mildly hyperintense T2/stir signal intensity within the bilateral paraspinous musculature extending from the C3-4 through C6-7 levels, best appreciated on sagittal STIR sequence (series 400, image 7). This finding is of uncertain significance. Normal intravascular flow voids present within the vertebral arteries bilaterally. Endotracheal and enteric tubes noted.  C2-3: Mild bilateral uncovertebral hypertrophy present without significant stenosis.  C3-4: Diffuse degenerative disc osteophyte with bilateral uncovertebral spurring and right greater than left facet arthrosis. There is resultant moderate right with mild left foraminal stenosis. Posterior disc osteophyte indents and partially effaces the ventral thecal sac and results in moderate canal stenosis.  C4-5: Broad-based diffuse irregular degenerative disc osteophyte with bilateral uncovertebral spurring and facet arthrosis. There is resultant severe bilateral foraminal stenosis, slightly worse on the right. Posterior disc osteophyte largely effaces the ventral thecal sac and results in moderate canal narrowing.  C5-6: Mild diffuse degenerative disc osteophyte with bilateral uncovertebral spurring and left-sided facet arthrosis. There is resultant mild right foraminal stenosis. More moderate left foraminal narrowing present due to uncovertebral spurring and facet disease. Posterior disc osteophyte indents the ventral thecal sac with secondary mild to moderate canal narrowing.  C6-7: Diffuse disc bulge with mild bilateral facet hypertrophy. The bulging disc effaces the ventral thecal sac with resultant moderate canal stenosis. There is moderate bilateral foraminal narrowing as well.  C7-T1: Mild bilateral uncovertebral spurring with facet hypertrophy. There is a superimposed more focal right paracentral disc osteophyte complex indenting the right ventral thecal sac. No significant canal stenosis. Foramina are patent.  IMPRESSION: 1. No  cord signal abnormality identified within the cervical spine. 2. Multilevel degenerative disc disease as above. There is essentially resultant moderate canal stenosis extending from the C3-4 through C6-7 levels (most severe at C6-7). There is secondary multilevel bilateral foraminal narrowing, severe at C4-5. Please see the above report for a full description of these findings. 3. Mild edema within the posterior paraspinous musculature extending from C3-4 through C6-7. This finding is nonspecific, and of uncertain clinical significance.   Electronically Signed   By: Jeannine Boga M.D.   On: 09/28/2014 02:09   Ct Tibia Fibula Right Wo Contrast  09/19/2014   CLINICAL DATA:  Osteomyelitis. Necrotizing fasciitis. New onset blisters in the RIGHT lower extremity.  EXAM: CT OF THE RIGHT TIBIA FIBULA WITHOUT CONTRAST  TECHNIQUE: Multidetector CT imaging was performed according to the standard protocol. Multiplanar CT image reconstructions were also generated.  COMPARISON:  Radiographs 09/18/2014.  FINDINGS: There is no gas in the soft tissues of the RIGHT lower extremity. Pretibial edema is present which is nonspecific. Ankle tendons appear within normal limits. Insertional calcaneal enthesopathy is incidentally noted. There is no soft tissue abscess. The tibia and fibula are normal, without osteomyelitis.  Skin thickening is present anteriorly in the pretibial area act, compatible with a areas of blistering.  IMPRESSION: Negative for osteomyelitis or necrotizing fasciitis. Superficial edema and blistering in the anterior RIGHT leg.   Electronically Signed   By: Dereck Ligas M.D.   On: 09/19/2014 13:53   Dg Chest Port 1 View  09/28/2014   CLINICAL DATA:  Increased work of breathing.  Subsequent encounter.  EXAM: PORTABLE CHEST - 1 VIEW  COMPARISON:  Chest radiograph performed earlier today at 4:48 a.m.  FINDINGS: The patient's endotracheal tube is seen ending 5-6 cm above the carina. The enteric tube is noted  extended below the diaphragm. A right IJ dual-lumen catheter is noted ending overlying the proximal to mid SVC. A left IJ line is also seen ending overlying the proximal to mid SVC.  Minimal left basilar opacity likely reflects atelectasis. The lungs are otherwise grossly clear. No pleural effusion or pneumothorax is seen.  The cardiomediastinal silhouette is borderline normal in size. No acute osseous abnormalities are identified.  IMPRESSION: Minimal left basilar opacity likely reflects atelectasis; lungs otherwise clear.   Electronically Signed   By: Garald Balding M.D.   On: 09/28/2014 02:48   Dg Chest Port 1 View  09/27/2014   CLINICAL DATA:  Sepsis, followup  EXAM: PORTABLE CHEST - 1 VIEW  COMPARISON:  Portable chest x-ray of 09/26/2014  FINDINGS: The chest is overlain by artifacts, limiting assessment. The lungs are not quite as well aerated and there is mild basilar opacity at on the left most consistent with atelectasis. In addition there is some opacity overlying the left medial lung field adjacent the aortic knob, and followup chest x-ray is recommended. The patient is somewhat rotated. The tip of the endotracheal tube is approximately 5.7 cm above the carina. Left IJ central venous line tip overlies the left innominate vein, and right IJ catheter is unchanged in position.  IMPRESSION: 1. Slightly diminished aeration with left basilar atelectasis. 2. Haziness overlying the medial left upper lung field may be due to rotation but recommend followup chest x-ray.   Electronically Signed   By: Ivar Drape M.D.   On: 09/27/2014 07:06   Dg Chest Port 1 View  09/26/2014   CLINICAL DATA:  ARDS .  EXAM: PORTABLE CHEST - 1 VIEW  COMPARISON:  09/25/2014.  FINDINGS: Endotracheal tube, NG tube, bilateral IJ lines in stable position. Mediastinum and hilar structures are normal. Heart size normal. Persistent mild bibasilar subsegmental atelectasis. Persistent left lower lobe infiltrate. No pleural effusion or  pneumothorax. No acute osseus abnormality.  IMPRESSION: 1. Lines and tubes in stable position. 2. Persistent bibasilar subsegmental atelectasis with mild infiltrate in the left lung base.   Electronically Signed   By: Marcello Moores  Register   On: 09/26/2014 07:10   Dg Chest Portable 1 View  09/25/2014   CLINICAL DATA:  Respiratory failure.  EXAM: PORTABLE CHEST - 1 VIEW  COMPARISON:  09/24/2014.  FINDINGS: Endotracheal tube, NG tube, bilateral IJ lines in stable position. Mediastinum hilar structures are unremarkable. Heart size normal. Pulmonary vascularity normal. Low lung volumes with mild basilar atelectasis. Continued clearing of pulmonary infiltrates consistent clearing pneumonia. No pneumothorax. No acute osseus abnormality.  IMPRESSION: 1. Lines and tubes in stable position. 2. Interim continued clearing of pulmonary infiltrates with mild bibasilar atelectasis.   Electronically Signed   By: Marcello Moores  Register   On: 09/25/2014 07:27   Dg Chest Port 1 View  09/24/2014   CLINICAL DATA:  Respiratory failure.  EXAM:  PORTABLE CHEST - 1 VIEW  COMPARISON:  09/15/2014.  FINDINGS: Endotracheal tube, NG tube, bilateral NG tubes in stable position. Stable cardiomegaly with normal pulmonary vascularity. Interim partial clearing of bilateral pulmonary infiltrates. No pleural effusion or pneumothorax. No acute osseous abnormality.  IMPRESSION: 1. Lines and tubes in stable position. 2. Interim partial clearing of bilateral pulmonary infiltrates.   Electronically Signed   By: Marcello Moores  Register   On: 09/24/2014 07:10   Dg Chest Port 1 View  09/23/2014   CLINICAL DATA:  Acute respiratory failure  EXAM: PORTABLE CHEST - 1 VIEW  COMPARISON:  Radiograph 09/22/2014  FINDINGS: The trachea 2, NG tube, and left and right central venous lines are unchanged. Stable cardiac silhouette. There is diffuse airspace disease greater on the left not changed from prior. No pneumothorax.  IMPRESSION: 1. Stable support apparatus. 2. No change in  bilateral airspace disease opacities representing multifocal pneumonia versus asymmetric edema.   Electronically Signed   By: Suzy Bouchard M.D.   On: 09/23/2014 08:54   Dg Chest Port 1 View  09/22/2014   CLINICAL DATA:  Acute respiratory failure, check HD catheter placement  EXAM: PORTABLE CHEST - 1 VIEW  COMPARISON:  09/22/2014  FINDINGS: Multifocal patchy opacities in the left lung and right upper lobe, suspicious for multifocal pneumonia.  No pleural effusion or pneumothorax.  Endotracheal tube terminates 4.5 cm above the carina.  Left IJ venous catheter terminates in the SVC. Right IJ hemodialysis catheter terminates in the mid SVC.  IMPRESSION: Right IJ hemodialysis catheter terminates in the mid SVC.  Endotracheal tube terminates 4.5 cm above the carina.  Multifocal patchy opacities in the left lung and right upper lobe, suspicious for multifocal pneumonia.   Electronically Signed   By: Julian Hy M.D.   On: 09/22/2014 15:59   Dg Chest Port 1 View  09/22/2014   CLINICAL DATA:  Check endotracheal tube placement  EXAM: PORTABLE CHEST - 1 VIEW  COMPARISON:  09/21/2014  FINDINGS: Cardiac shadow is stable. Endotracheal tube and nasogastric catheter are again noted in satisfactory position. The endotracheal tube lies 5.9 cm above the carina. A left jugular central line is stable. Lungs are well aerated bilaterally with persistent bilateral opacities notable in the right upper lobe. The overall appearance is stable from the prior exam. No new focal abnormality is noted.  IMPRESSION: Tubes and lines as described above.  Stable bilateral opacities worst in the right upper lobe.   Electronically Signed   By: Inez Catalina M.D.   On: 09/22/2014 06:45   Dg Chest Port 1 View  09/21/2014   CLINICAL DATA:  59 year old female with respiratory failure, intubated. Initial encounter.  EXAM: PORTABLE CHEST - 1 VIEW  COMPARISON:  0459 hours today and earlier.  FINDINGS: Portable AP view at 1700 hours. Stable  endotracheal tube tip at the level the clavicles. Stable visible enteric tube. Stable left IJ central line. New resuscitation pads or pacing pads project over the chest.  Stable to mildly lower lung volumes. Visible mediastinal contours are stable. No pneumothorax or plural effusion is evident. Streaky perihilar opacity persists, but is regressed compared to 09/20/2014.  IMPRESSION: 1.  Stable lines and tubes. 2. Continued streaky perihilar opacity, improved compared to 09/20/2014.   Electronically Signed   By: Genevie Ann M.D.   On: 09/21/2014 17:12   Dg Chest Port 1 View  09/21/2014   CLINICAL DATA:  Hypoxia  EXAM: PORTABLE CHEST - 1 VIEW  COMPARISON:  September 20, 2014  FINDINGS: Endotracheal tube tip is 4.5 cm above the carina. Central catheter tip is in the superior cava slightly beyond the junction with the left innominate vein. Nasogastric tube tip and side port are in the stomach. No pneumothorax. There is slightly less alveolar consolidation on the left compared to 1 day prior. There is mild atelectasis in the lung right upper lobe. Right lung is otherwise clear. Heart is mildly enlarged with pulmonary vascularity within normal limits.  IMPRESSION: Tube and catheter positions as described without pneumothorax. There is patchy consolidation throughout the left lung, less pronounced compared to 1 day prior. Mild atelectasis right upper lobe. No new opacity. No change in cardiac silhouette.   Electronically Signed   By: Lowella Grip III M.D.   On: 09/21/2014 07:11   Dg Chest Port 1 View  09/20/2014   CLINICAL DATA:  Acute respiratory failure with hypoxia. Shock. Acute renal failure.  EXAM: PORTABLE CHEST - 1 VIEW  COMPARISON:  09/19/2014  FINDINGS: Support lines and tubes in appropriate position. No pneumothorax identified.  Worsening airspace disease is seen throughout the majority of the left lung and in the right upper lobe. Low lung volumes again noted. Heart size remains stable.  IMPRESSION:  Worsening asymmetric airspace disease throughout left lung and right upper lobe.   Electronically Signed   By: Earle Gell M.D.   On: 09/20/2014 09:38   Dg Chest Port 1 View  09/19/2014   CLINICAL DATA:  Hypoxia  EXAM: PORTABLE CHEST - 1 VIEW  COMPARISON:  September 18, 2014  FINDINGS: Endotracheal tube tip is 4.2 cm above the carina. Nasogastric tube tip and side port are below the diaphragm. Central catheter tip is in the superior vena cava just beyond the junction with the left innominate vein, stable. No pneumothorax. There remains moderate interstitial edema, more on the left than on the right. There is a patchy area of consolidation right upper lobe. Heart is mildly enlarged with pulmonary vascularity within normal limits.  IMPRESSION: Interstitial edema. There is a focal area of airspace consolidation in the right upper lobe which may represent developing pneumonia. No change in cardiac silhouette. Suspect a degree of congestive heart failure. There may be superimposed ARDS. Tube and catheter positions are as described without pneumothorax.   Electronically Signed   By: Lowella Grip III M.D.   On: 09/19/2014 08:11   Dg Chest Port 1 View  09/18/2014   CLINICAL DATA:  Central line placement  EXAM: PORTABLE CHEST - 1 VIEW  COMPARISON:  09/17/2014  FINDINGS: Cardiomediastinal silhouette is stable. Stable endotracheal tube and NG tube position. Persistent mild vascular congestion and mild interstitial edema bilaterally. Superimposed infiltrate right upper lobe and left base cannot be excluded. There is new left IJ central line with tip in SVC. No pneumothorax.  IMPRESSION: Persistent mild vascular congestion and mild interstitial edema bilaterally. Superimposed infiltrate right upper lobe and left base cannot be excluded. There is new left IJ central line with tip in SVC. No pneumothorax.   Electronically Signed   By: Lahoma Crocker M.D.   On: 09/18/2014 11:53   Dg Chest Port 1 View  09/17/2014   CLINICAL  DATA:  Follow-up respiratory failure  EXAM: PORTABLE CHEST - 1 VIEW  COMPARISON:  09/17/2014  FINDINGS: Endotracheal tube tip measures 3.3 cm above the carinal. Enteric tube tip is off the field of view but below the left hemidiaphragm. Shallow inspiration. Mild cardiac enlargement with probable pulmonary vascular congestion. Perihilar infiltration suggesting edema or pneumonia.  Small bilateral pleural effusions appear slightly improved since prior study. No pneumothorax. Degenerative changes in the spine.  IMPRESSION: Cardiac enlargement with pulmonary vascular congestion and bilateral pleural effusions. Slight improvement since previous study.   Electronically Signed   By: Lucienne Capers M.D.   On: 09/17/2014 21:25   Dg Tibia/fibula Right Port  09/18/2014   CLINICAL DATA:  Right lower leg pain and redness.  EXAM: PORTABLE RIGHT TIBIA AND FIBULA - 2 VIEW  COMPARISON:  11/08/2013 knee radiographs  FINDINGS: There is evidence of fracture, subluxation or dislocation.  No radiographic evidence of osteomyelitis is noted.  Mild soft tissue swelling is present.  Degenerative changes in the knee again noted.  IMPRESSION: Mild soft tissue swelling without acute bony abnormality.   Electronically Signed   By: Margarette Canada M.D.   On: 09/18/2014 14:25   Dg Abd Portable 1v  09/28/2014   CLINICAL DATA:  Nasogastric tube placement  EXAM: PORTABLE ABDOMEN - 1 VIEW  COMPARISON:  None.  FINDINGS: The nasogastric tube tip is at the pylorus.  The bowel gas pattern is nonobstructive. Cholecystectomy changes noted. The visible left lung base is clear.  IMPRESSION: Nasogastric tube with tip at the pylorus.   Electronically Signed   By: Monte Fantasia M.D.   On: 09/28/2014 00:19   US Abdomen Limited Ruq  09/19/2014   CLINICAL DATA:  Elevated liver enzymes  EXAM: US ABDOMEN LIMITED - RIGHT UPPER QUADRANT  COMPARISON:  CT abdomen and pelvis September 17, 2014  FINDINGS: Gallbladder:  Surgically absent.  Common bile duct:  Diameter:  5 mm. There is no intrahepatic or extrahepatic biliary duct dilatation.  Liver:  No focal lesion identified. Liver echogenicity is diffusely increased and somewhat heterogeneous.  IMPRESSION: Liver echogenicity is diffusely increased and somewhat heterogeneous. These findings most likely are indicative of hepatic steatosis. While no focal liver lesions are identified, it must be cautioned that the sensitivity of ultrasound for focal liver lesions is diminished in this circumstance. Gallbladder is absent.   Electronically Signed   By: Lowella Grip III M.D.   On: 09/19/2014 07:04    Labs:  CBC:  Recent Labs  09/26/14 0459 09/27/14 0450 09/27/14 1345 09/28/14 0500  WBC 23.6* 14.3* 12.1* 12.3*  HGB 7.1* 6.4* 7.5* 7.3*  HCT 21.8* 19.9* 23.4* 23.0*  PLT 154 142* 126* 129*    COAGS:  Recent Labs  09/17/14 2150 09/20/14 0610 09/21/14 1659  INR 1.63* 1.30 1.39  APTT  --  37  --     BMP:  Recent Labs  09/27/14 0450 09/27/14 1345 09/27/14 2028 09/28/14 0500  NA 153* 151* 148* 147*  K 3.1* 3.2* 3.2* 4.1  CL 116* 113* 116* 116*  CO2 25 29 29 27   GLUCOSE 213* 208* 255* 171*  BUN 108* 99* 90* 78*  CALCIUM 8.0* 8.1* 8.3* 8.2*  CREATININE 1.88* 1.73* 1.60* 1.49*  GFRNONAA 28* 31* 34* 37*  GFRAA 33* 36* 40* 43*    LIVER FUNCTION TESTS:  Recent Labs  09/23/14 0420  09/25/14 1134 09/26/14 0459 09/26/14 1500 09/27/14 0450 09/28/14 0500  BILITOT 4.5*  --  2.3* 1.8*  --  1.4*  --   AST 132*  --  87* 100*  --  61*  --   ALT 202*  --  145* 139*  --  102*  --   ALKPHOS 176*  --  414* 472*  --  304*  --   PROT 6.8  --  6.8 6.4  --  6.4  --   ALBUMIN 1.5*  1.5*  < > 1.8* 1.8* 1.7* 1.7* 1.7*  < > = values in this interval not displayed.  Assessment and Plan: AMS s/p cardiac arrest Respiratory failure s/p intubation-Tracheostomy planned for today Acute renal failure, improving-Nephrology on board Group A strep sepsis shock on antibiotics and now off of pressors, repeat  BCx 3/2 no growth, afebrile Malnutrition, request for percutaneous gastrostomy tube  CT ordered to evaluate anatomy, labs reviewed, will need sq heparin held if moving forward for Monday and tube feeds stopped Attempted to call patient's son and left a message to call IR back for telephone consent.  I have spoke with the patient's son Ames Dura today over the phone Risks and Benefits discussed with the patient's son including, but not limited to the need for a barium enema during the procedure, bleeding, infection, peritonitis, or damage to adjacent structures. All questions were answered, patient's son is agreeable to proceed. Consent signed and in chart.   Thank you for this interesting consult.  I greatly enjoyed meeting SORA VROOMAN and look forward to participating in their care.  SignedHedy Jacob 09/28/2014, 11:10 AM   I spent a total of 40 Minutes in face to face in clinical consultation, greater than 50% of which was counseling/coordinating care for malnutrition.

## 2014-09-28 NOTE — Progress Notes (Signed)
ANTIBIOTIC CONSULT NOTE   Pharmacy Consult for vancomycin + ceftaz Indication: GAS bacteremia, pna, fevers  No Known Allergies  Patient Measurements: Height: 5\' 7"  (170.2 cm) Weight: 227 lb 11.8 oz (103.3 kg) IBW/kg (Calculated) : 61.6  Vital Signs: Temp: 99 F (37.2 C) (03/04 0416) Temp Source: Oral (03/04 0416) BP: 127/59 mmHg (03/04 0700) Pulse Rate: 78 (03/04 0700) Intake/Output from previous day: 03/03 0701 - 03/04 0700 In: 6270.3 [I.V.:4267.5; Blood:50; NG/GT:2350; IV [id number]:993] Out: 4200 [ZJIRC:7893; Stool:275] Intake/Output from this shift:    Labs:  Recent Labs  09/27/14 0450 09/27/14 1345 09/27/14 2028 09/28/14 0500  WBC 14.3* 12.1*  --  12.3*  HGB 6.4* 7.5*  --  7.3*  PLT 142* 126*  --  129*  CREATININE 1.88* 1.73* 1.60* 1.49*   Estimated Creatinine Clearance: 50.3 mL/min (by C-G formula based on Cr of 1.49). No results for input(s): VANCOTROUGH, VANCOPEAK, VANCORANDOM, GENTTROUGH, GENTPEAK, GENTRANDOM, TOBRATROUGH, TOBRAPEAK, TOBRARND, AMIKACINPEAK, AMIKACINTROU, AMIKACIN in the last 72 hours.   Microbiology: Recent Results (from the past 720 hour(s))  MRSA PCR Screening     Status: None   Collection Time: 09/17/14  9:25 PM  Result Value Ref Range Status   MRSA by PCR NEGATIVE NEGATIVE Final    Comment:        The GeneXpert MRSA Assay (FDA approved for NASAL specimens only), is one component of a comprehensive MRSA colonization surveillance program. It is not intended to diagnose MRSA infection nor to guide or monitor treatment for MRSA infections.   Culture, Urine     Status: None   Collection Time: 09/18/14  8:26 AM  Result Value Ref Range Status   Specimen Description URINE, CATHETERIZED  Final   Special Requests Normal  Final   Colony Count NO GROWTH Performed at [facility]   Final   Culture NO GROWTH Performed at [facility]   Final   Report Status 09/19/2014 FINAL  Final  Blood culture (routine x 2)      Status: None   Collection Time: 09/18/14  9:05 AM  Result Value Ref Range Status   Specimen Description BLOOD LEFT HAND  Final   Special Requests BOTTLES DRAWN AEROBIC ONLY [place]  Final   Culture   Final    NO GROWTH 5 DAYS Performed at [facility]    Report Status 09/24/2014 FINAL  Final  Respiratory virus panel (routine influenza)     Status: None   Collection Time: 09/18/14  9:12 AM  Result Value Ref Range Status   Source - RVPAN NOSE  Corrected   Respiratory Syncytial Virus A Negative Negative Final   Respiratory Syncytial Virus B Negative Negative Final   Influenza A Negative Negative Final   Influenza B Negative Negative Final   Parainfluenza 1 Negative Negative Final   Parainfluenza 2 Negative Negative Final   Parainfluenza 3 Negative Negative Final   Metapneumovirus Negative Negative Final   Rhinovirus Negative Negative Final   Adenovirus Negative Negative Final    Comment: (NOTE) Performed At: [facility] [place], [place] [id number] Lindon Romp MD HE:[id number]   Blood culture (routine x 2)     Status: None   Collection Time: 09/18/14 10:30 AM  Result Value Ref Range Status   Specimen Description BLOOD LEFT CENTRAL LINE  Final   Special Requests BOTTLES DRAWN AEROBIC AND ANAEROBIC 10CC  Final   Culture   Final    NO GROWTH 5 DAYS Performed at [facility]  Report Status 09/24/2014 FINAL  Final  Culture, respiratory (NON-Expectorated)     Status: None   Collection Time: 09/18/14 11:34 AM  Result Value Ref Range Status   Specimen Description TRACHEAL ASPIRATE  Final   Special Requests NONE  Final   Gram Stain   Final    ABUNDANT WBC PRESENT,BOTH PMN AND MONONUCLEAR RARE SQUAMOUS EPITHELIAL CELLS PRESENT RARE YEAST Performed at [facility]    Culture   Final    FEW YEAST CONSISTENT WITH CANDIDA SPECIES RARE GROUP A STREP (S.PYOGENES) ISOLATED Performed at [facility]    Report Status  09/20/2014 FINAL  Final  Blood culture (routine x 2)     Status: None   Collection Time: 09/19/14  5:00 PM  Result Value Ref Range Status   Specimen Description BLOOD RIGHT ARM  Final   Special Requests BOTTLES DRAWN AEROBIC ONLY 10CC  Final   Culture   Final    NO GROWTH 5 DAYS Performed at [facility]    Report Status 09/26/2014 FINAL  Final  Blood culture (routine x 2)     Status: None   Collection Time: 09/19/14  5:20 PM  Result Value Ref Range Status   Specimen Description BLOOD RIGHT HAND  Final   Special Requests BOTTLES DRAWN AEROBIC ONLY 5CC  Final   Culture   Final    NO GROWTH 5 DAYS Performed at [facility]    Report Status 09/26/2014 FINAL  Final  Culture, respiratory (NON-Expectorated)     Status: None   Collection Time: 09/23/14 11:33 AM  Result Value Ref Range Status   Specimen Description ENDOTRACHEAL  Final   Special Requests NONE  Final   Gram Stain   Final    FEW WBC PRESENT,BOTH PMN AND MONONUCLEAR RARE SQUAMOUS EPITHELIAL CELLS PRESENT RARE YEAST Performed at [facility]    Culture   Final    Non-Pathogenic Oropharyngeal-type Flora Isolated. Performed at [facility]    Report Status 09/25/2014 FINAL  Final  Clostridium Difficile by PCR     Status: None   Collection Time: 09/24/14  9:28 AM  Result Value Ref Range Status   C difficile by pcr NEGATIVE NEGATIVE Final  Culture, blood (routine x 2)     Status: None (Preliminary result)   Collection Time: 09/26/14  1:50 PM  Result Value Ref Range Status   Specimen Description BLOOD LEFT HAND  Final   Special Requests BOTTLES DRAWN AEROBIC ONLY 5CC  Final   Culture   Final           BLOOD CULTURE RECEIVED NO GROWTH TO DATE CULTURE WILL BE HELD FOR 5 DAYS BEFORE ISSUING A FINAL NEGATIVE REPORT Performed at [facility]    Report Status PENDING  Incomplete  Culture, blood (routine x 2)     Status: None (Preliminary result)   Collection Time: 09/26/14  1:52 PM   Result Value Ref Range Status   Specimen Description BLOOD RIGHT HAND  Final   Special Requests BOTTLES DRAWN AEROBIC ONLY 5CC  Final   Culture   Final           BLOOD CULTURE RECEIVED NO GROWTH TO DATE CULTURE WILL BE HELD FOR 5 DAYS BEFORE ISSUING A FINAL NEGATIVE REPORT Performed at [facility]    Report Status PENDING  Incomplete    Assessment: 59 y.o. female previously on Unasyn for GAS bacteremia now broadened to ceftazidime and vancomycin due to new fevers  overnight on 3/2. Today is D#3 of vancomycin and ceftazidime and D#11 total abx. Pt is now afebrile and WBC is trending down. Scr is also improving down to 1.49.   Ceftaz 3/2>> 2/22 Vanc >>2/26; 2/28>>2/29; 3/2>> 2/22 Zosyn >>2/26 2/22 Levaquin >>2/24 2/23 Tamiflu >>2/24 2/24 Clindamycin >>2/26 Unasyn 2/26>>3/2  3/2 BCx2 >> ngtd 2/23 MRSA PCR neg 2/23 Flu neg 2/23 Urine >>neg 2/23 TA >>few candida albicans; rare GAS 2/23 Blood >> ngtd [place] blood >> 1/2 GAS (did not conduct sensitivities)  Goal of Therapy:  Resolution of infection  Vancomycin trough Goal 15-20 mcg/ml  Plan:  1. Change ceftazidime to 2gm IV Q12H 2. Continue vanc 1250mg  IV Q24H - may require further adjustment soon depending on Scr trend 3. F/u renal fxn, C&S, clinical status and trough at SS 4. Consider de-escalating antibiotic coverage as able  Salome Arnt, PharmD, BCPS Pager # [phone] 09/28/2014 7:53 AM

## 2014-09-28 NOTE — Progress Notes (Signed)
Pt transported to CT and back to room on vent 100% fio2, without incident.  Pt tolerated well.

## 2014-09-28 NOTE — Procedures (Signed)
Name:  DEMRI POULTON MRN:  [id number] DOB:  [birth year]  OPERATIVE NOTE  Procedure:  Percutaneous tracheostomy.  Indications:  Ventilator-dependent respiratory failure.  Consent:  Procedure, alternatives, risks and benefits discussed with medical POA.  Questions answered.  Consent obtained.  Anesthesia:  Vec, versed, fent, etomidate  Procedure summary:  Appropriate equipment was assembled.  The patient was identified as Jamie Fields and safety timeout was performed. The patient was placed in supine position with a towel roll behind shoulder blades and neck extended.  Sterile technique was used. The patient's neck and upper chest were prepped using chlorhexidine / alcohol scrub and the field was draped in usual sterile fashion with full body drape. After the adequate sedation / anesthesia was achieved, attention was directed at the midline trachea, where the cricothyroid membrane was palpated. Approximately two fingerbreadths above the sternal notch, a verticle incision was created with a scalpel after local infiltration with 0.2% Lidocaine. Then, using Seldinger technique and a percutaneous tracheostomy set, the trachea was entered with a 14 gauge needle with an overlying sheath. This was all confirmed under direct visualization of a fiberoptic flexible bronchoscope. Entrance into the trachea was identified through the third tracheal ring interspace. Noted a arterial vessel injured oozing mild, i placed 3 o  Silk and complete resolution bleeding, it was leading to thyroid likely. Following this, a guidewire was inserted. The needle was removed, leaving the sheath and the guidewire intact. Next, the sheath was removed and a small dilator was inserted. The tracheal rings were then dilated. A #6 Shiley was then opened. The balloon was checked. It was placed over a tracheal dilator, which was then advanced over the guidewire and through the previously dilated tract. The Shiley tracheostomy tube was  noted to pass in the trachea with little resistance. I loaded surgiseal around the trach upon entry.  Airway dry. The guidewire and dilator tubes were removed from the trachea. An inner cannula was placed through the tracheostomy tube. The tracheostomy was then secured at the anterior neck with 4 monofilament sutures. The oral endotracheal tube was removed and the ventilator was attached to the newly placed tracheostomy tube. Adequate tidal volumes were noted. The cuff was inflated and no evidence of air leak was noted. No evidence of bleeding was noted. At this point, the procedure was concluded. Post-procedure chest x-ray was ordered.  Complications:  No immediate complications were noted.  Hemodynamic parameters and oxygenation remained stable throughout the procedure.  Estimated blood loss:  Less then 15 mL.  Raylene Miyamoto., MD [facility] [place] Pager: [phone]  09/28/2014, 12:30 PM

## 2014-09-28 NOTE — Care Management Note (Addendum)
    Page 1 of 2   10/11/2014     10:13:36 AM CARE MANAGEMENT NOTE 10/11/2014  Patient:  [facility]   Account Number:  [id number]  Date Initiated:  09/18/2014  Documentation initiated by:  [facility]  Subjective/Objective Assessment:   Admitted from OSH with Shock - intubated and on pressors.     Action/Plan:   Anticipated DC Date:  10/11/2014   Anticipated DC Plan:  SKILLED NURSING FACILITY  In-house referral  Clinical Social Worker      DC Planning Services  CM consult      PAC Choice  LONG TERM ACUTE CARE   Choice offered to / List presented to:             Status of service:  Completed, signed off Medicare Important Message given?  YES (If response is "NO", the following Medicare IM given date fields will be blank) Date Medicare IM given:  10/11/2014 Medicare IM given by:  Elissa Hefty Date Additional Medicare IM given:  10/08/2014 Additional Medicare IM given by:  Elissa Hefty  Discharge Disposition:  [place]  Per UR Regulation:  Reviewed for med. necessity/level of care/duration of stay  If discussed at [place] of Stay Meetings, dates discussed:   09/25/2014  09/27/2014  10/02/2014  10/09/2014  10/11/2014    Comments:  ContactAmes Dura Son   [phone]    Thacker,Roger Brother [phone]  10-02-14 11:30am Luz Lex, RNBSN [phone] Now on trach collar - Peg yesterday.  Does not meet criteria for Ltach under her insurance now.  SW aware of need for probable SNF - PT/OT ordered.  3/4 1356 debbie Noriko Macari rn,bsn pt trached today. have asked [facility] to ck and see if ltac benefits if needed. will ask [facility] to ck also for elidg if needed.  09-27-14 10am [place] [phone] Remains vented - off CRRT and pressors.  Increased UO. Opening and closing eyes to command per notes.  CM will continue to follow.  09-24-14 8:30am [place] [phone] CRRT started - PEA arrest - plan for trach this  week.  09-21-14 9:45am [place] [phone] Remains on vent 60% and pressors.

## 2014-09-28 NOTE — Progress Notes (Signed)
Subjective: Continues to improve per nurse. More alert today.   Exam: Filed Vitals:   09/28/14 0800  BP: 137/66  Pulse: 83  Temp:   Resp: 20   Gen: In bed, NAD MS: Opens eyes spontaneously, tracts my finger, shrugs shoulders to command. GB:EEFEO, blinks to threat Motor: shrugs left shoulder but unable to move bilateral arms or legs Sensory: grimaces to noxious stimuli bilateral arms and legs DTR:2+ and symmetric at the knees.   Pertinent Labs: Phos 6.1 WBC 12.3  MRI cervical spine  IMPRESSION: 1. No cord signal abnormality identified within the cervical spine. 2. Multilevel degenerative disc disease as above. There is essentially resultant moderate canal stenosis extending from the C3-4 through C6-7 levels (most severe at C6-7). There is secondary multilevel bilateral foraminal narrowing, severe at C4-5. Please see the above report for a full description of these findings. 3. Mild edema within the posterior paraspinous musculature extending from C3-4 through C6-7. This finding is nonspecific, and of uncertain clinical significance.  MRI brain: 1. Negative for infarct or other definite abnormality. 2. Sulcal FLAIR hyperintensity likely related to high FIO2 in this intubated patient. Please see discussion above.   Etta Quill PA-C [facility] [phone]  09/28/2014, 9:23 AM   Impression: 59 yo F with AMS following cardiac arrest. She has markedly improved in mental status, but remains quadriplegic. She endorses sensation in arms. She would be at very high risk given steroids and sepsis for a critical illness myopathy/neuropathy. She does not have any clear upper motor neuron findings, but the persistence of reflexes would argue against this. I would be concerned for spinal injury during arrest(hypotensive).     Recommendations: 1) Agree with continued supportive care.  2) though EMG/NCS could be helpful, I do not think that it would significantly change  management at this time and is not available.  Roland Rack, MD [facility] [phone]  If 7pm- 7am, please page neurology on call as listed in [place].

## 2014-09-28 NOTE — Progress Notes (Signed)
Advanced ETT 3 cm per order. 25 @ lip

## 2014-09-28 NOTE — Progress Notes (Signed)
RT note-placed back on to full support, Tracheostomy scheduled for 1030.

## 2014-09-28 NOTE — Procedures (Signed)
Bedside Tracheostomy Insertion Procedure Note   Patient Details:   Name: Jamie Fields DOB: 01/02/1956 MRN: [id number]  Procedure: Tracheostomy  Pre Procedure Assessment: ET Tube Size:7.5  ET Tube secured at lip (cm): 24  Bite block in place: Yes Breath Sounds: Rhonch  Post Procedure Assessment: BP 106/41 mmHg  Pulse 64  Temp(Src) 98.6 F (37 C) (Oral)  Resp 17  Ht 5\' 7"  (1.702 m)  Wt 227 lb 11.8 oz (103.3 kg)  BMI 35.66 kg/m2  SpO2 100% O2 sats: stable throughout Complications: No apparent complications Patient did tolerate procedure well Tracheostomy Brand:Shiley Tracheostomy Style:Cuffed Tracheostomy Size: 6.0 Tracheostomy Secured AFB:[id number] Tracheostomy Placement Confirmation:Trach cuff visualized and in place and Chest X ray ordered for placement    Mickie Hillier 09/28/2014, 12:31 PM

## 2014-09-28 NOTE — Progress Notes (Signed)
PULMONARY / CRITICAL CARE MEDICINE   Name: Jamie Fields MRN: [id number] DOB: Jul 31, 1955    ADMISSION DATE:  09/17/2014 CONSULTATION DATE:  09/17/2014   REFERRING MD :  [facility]  CHIEF COMPLAINT:  Respiratory Distress  INITIAL PRESENTATION: 59 y.o. F who was taken to [facility] ED early AM hours 2/22 for respiratory distress.  She was admitted and placed on BiPAP.  Was also found to have acute renal failure, AG metabolic acidosis, Overnight, her acidosis and respiratory status worsened to the point she required intubation.  She was also placed on levophed for persistent shock.  Later in day 2/22 was transferred to [facility] for further management.  STUDIES:  CT Chest 2/22 >> no acute process CT abd / pelv 2/22 >> iliac lymph nodes likely reactive.  No acute process CXR 2/23  >> Persistent mild vascular congestion and mild interstitial edema bilaterally. Superimposed infiltrate right upper lobe and left base cannot be excluded. Echo 5/02 >> Systolic function was normal. EF 60% to 65%. Right Tib/Fib DG >> No evidence of osteo; Mild soft tissue swelling RUQ [place] 2/23 >> Liver echogenicity is diffusely increased and somewhat heterogeneous. Most likely are indicative of hepatic steatosis CT tib/fib Rt >> Negative for osteomyelitis or necrotizing fasciitis. Superficial edema and blistering CXR 2/28 >> No change in bilateral airspace disease opacities representing multifocal pneumonia versus asymmetric edema 3/1 ct head>>>neg 3/1 eeg>>slow, enceph 3/2RI brain>>>neg acute, sulci flare, may be related to high fio2 3/3 awake, but not moving extremeties 3/3 mri neck - neg   SIGNIFICANT EVENTS: 2/22  Admitted at [facility]; respiratory failure-intubated; shock; transferred to [facility] 2/23  On [place]; intubated; Fever, CXR infiltrate = sepsis 2/24  Group A strep, added Clinda; CT neg for nec fas; Tube feeds started 2/26  Hypoxia, brady arrest/ PEA in pm  2/27  CVVHD initiated  2/28  Pt net neg 6L;  New CXR infiltrate - Vanc restarted 2/29- cvvhd off, urine output increased  SUBJECTIVE: full awake, limited ext movement  VITAL SIGNS: Temp:  [97.2 F (36.2 C)-99.6 F (37.6 C)] 97.2 F (36.2 C) (03/04 0753) Pulse Rate:  [57-91] 83 (03/04 0800) Resp:  [17-31] 20 (03/04 0800) BP: (94-156)/(39-72) 137/66 mmHg (03/04 0800) SpO2:  [97 %-100 %] 100 % (03/04 0800) FiO2 (%):  [40 %] 40 % (03/04 0820) Weight:  [103.3 kg (227 lb 11.8 oz)] 103.3 kg (227 lb 11.8 oz) (03/04 0500)   HEMODYNAMICS:     VENTILATOR SETTINGS: Vent Mode:  [-] PRVC FiO2 (%):  [40 %] 40 % Set Rate:  [14 bmp-18 bmp] 18 bmp Vt Set:  [490 mL] 490 mL PEEP:  [5 cmH20] 5 cmH20 Plateau Pressure:  [15 cmH20-35 cmH20] 15 cmH20   INTAKE / OUTPUT:  Intake/Output Summary (Last 24 hours) at 09/28/14 0910 Last data filed at 09/28/14 0800  Gross per 24 hour  Intake 7187.5 ml  Output   4150 ml  Net 3037.5 ml   PHYSICAL EXAMINATION: General: Obese female, awake Neuro: not moving arms legs, awake, follows commands HEENT: [place]/AT. PERRL 3 mm Cardiovascular: RRR s1 s 2  Lungs: coarse Abdomen: Obese, BS x 4, soft, NT/ND.  Musculoskeletal: edema 1 plus Skin: RLE dressing c/d/i  LABS:  CBC  Recent Labs Lab 09/27/14 0450 09/27/14 1345 09/28/14 0500  WBC 14.3* 12.1* 12.3*  HGB 6.4* 7.5* 7.3*  HCT 19.9* 23.4* 23.0*  PLT 142* 126* 129*   Coag's  Recent Labs Lab 09/21/14 1659  INR 1.39   BMET  Recent Labs Lab 09/27/14 1345 09/27/14 2028 09/28/14 0500  NA 151* 148* 147*  K 3.2* 3.2* 4.1  CL 113* 116* 116*  CO2 29 29 27   BUN 99* 90* 78*  CREATININE 1.73* 1.60* 1.49*  GLUCOSE 208* 255* 171*   Electrolytes  Recent Labs Lab 09/26/14 0459 09/26/14 1500 09/27/14 0450 09/27/14 1345 09/27/14 2028 09/28/14 0500  CALCIUM 7.7* 7.6* 8.0* 8.1* 8.3* 8.2*  MG 2.1  --  2.1  --   --  1.8  PHOS 5.9* 6.8* 7.2*  --   --  6.1*     Sepsis Markers  Recent Labs Lab 09/21/14 1659 09/21/14 1959  09/24/14 0937  LATICACIDVEN 3.3* 1.4 1.6     ABG  Recent Labs Lab 09/23/14 0410 09/24/14 0943 09/25/14 0335  PHART 7.312* 7.393 7.401  PCO2ART 50.9* 45.2* 43.5  PO2ART 68.2* 115.0* 97.2   Liver Enzymes  Recent Labs Lab 09/25/14 1134 09/26/14 0459 09/26/14 1500 09/27/14 0450 09/28/14 0500  AST 87* 100*  --  61*  --   ALT 145* 139*  --  102*  --   ALKPHOS 414* 472*  --  304*  --   BILITOT 2.3* 1.8*  --  1.4*  --   ALBUMIN 1.8* 1.8* 1.7* 1.7* 1.7*   Cardiac Enzymes  Recent Labs Lab 09/21/14 1659  TROPONINI 0.28*   Glucose  Recent Labs Lab 09/27/14 1219 09/27/14 1621 09/27/14 1914 09/27/14 2353 09/28/14 0445 09/28/14 0711  GLUCAP 219* 153* 208* 187* 141* 154*    Imaging Mr Brain Wo Contrast  09/27/2014   CLINICAL DATA:  Anoxia, stroke, and altered mental status.  EXAM: MRI HEAD WITHOUT CONTRAST  TECHNIQUE: Multiplanar, multiecho pulse sequences of the brain and surrounding structures were obtained without intravenous contrast.  COMPARISON:  10/01/2011  FINDINGS: Calvarium and upper cervical spine: Chronic calvarial thickening. No focal marrow signal abnormality.  Orbits: No significant findings.  Sinuses: Paranasal sinuses are clear. There is bilateral mucosal thickening or effusions in the mastoid air cells.  Brain: No acute abnormality such as acute infarct, acute hemorrhage, hydrocephalus, or mass lesion. No evidence of large vessel occlusion.  There is no significant small-vessel ischemic change.  Linear hyperintense FLAIR signal scattered throughout the convexities of the bilateral cerebral hemispheres. No associated gradient signal abnormality or high density on recent head CT to suggest subarachnoid hemorrhage. No gray matter edema or sulcal diffusion abnormality typical of meningitis. The patient is intubated and has a FiO2 a 50%, the most likely cause. If there is any clinical concern for meningitis or subarachnoid hemorrhage, lumbar puncture could be  performed.  IMPRESSION: 1. Negative for infarct or other definite abnormality. 2. Sulcal FLAIR hyperintensity likely related to high FIO2 in this intubated patient. Please see discussion above.   Electronically Signed   By: Monte Fantasia M.D.   On: 09/27/2014 03:30   Dg Chest Port 1 View  09/28/2014   CLINICAL DATA:  Increased work of breathing.  Subsequent encounter.  EXAM: PORTABLE CHEST - 1 VIEW  COMPARISON:  Chest radiograph performed earlier today at 4:48 a.m.  FINDINGS: The patient's endotracheal tube is seen ending 5-6 cm above the carina. The enteric tube is noted extended below the diaphragm. A right IJ dual-lumen catheter is noted ending overlying the proximal to mid SVC. A left IJ line is also seen ending overlying the proximal to mid SVC.  Minimal left basilar opacity likely reflects atelectasis. The lungs are otherwise grossly clear. No pleural effusion or pneumothorax is seen.  The cardiomediastinal silhouette is borderline normal in size. No acute osseous abnormalities are identified.  IMPRESSION: Minimal left basilar opacity likely reflects atelectasis; lungs otherwise clear.   Electronically Signed   By: Garald Balding M.D.   On: 09/28/2014 02:48   Dg Chest Port 1 View  09/27/2014   CLINICAL DATA:  Sepsis, followup  EXAM: PORTABLE CHEST - 1 VIEW  COMPARISON:  Portable chest x-ray of 09/26/2014  FINDINGS: The chest is overlain by artifacts, limiting assessment. The lungs are not quite as well aerated and there is mild basilar opacity at on the left most consistent with atelectasis. In addition there is some opacity overlying the left medial lung field adjacent the aortic knob, and followup chest x-ray is recommended. The patient is somewhat rotated. The tip of the endotracheal tube is approximately 5.7 cm above the carina. Left IJ central venous line tip overlies the left innominate vein, and right IJ catheter is unchanged in position.  IMPRESSION: 1. Slightly diminished aeration with left  basilar atelectasis. 2. Haziness overlying the medial left upper lung field may be due to rotation but recommend followup chest x-ray.   Electronically Signed   By: Ivar Drape M.D.   On: 09/27/2014 07:06   Dg Abd Portable 1v  09/28/2014   CLINICAL DATA:  Nasogastric tube placement  EXAM: PORTABLE ABDOMEN - 1 VIEW  COMPARISON:  None.  FINDINGS: The nasogastric tube tip is at the pylorus.  The bowel gas pattern is nonobstructive. Cholecystectomy changes noted. The visible left lung base is clear.  IMPRESSION: Nasogastric tube with tip at the pylorus.   Electronically Signed   By: Monte Fantasia M.D.   On: 09/28/2014 00:19   ASSESSMENT / PLAN:  PULMONARY OETT 2/22 >>> A: Acute hypoxic respiratory failure COPD without evidence of exacerbation ARDS P:   Trach planed, failed wean yesterday Post trach would cpap5 ps 10 Allow pos balance pcxr in am   CARDIOVASCULAR CVL R fem 2/22 >> 2/23 CVL L IJ 2/23 >> Aline L rad 2/23 >> A:  Shock - Septic +/-  Hypovolemic Lactic Acidosis - resolved 2/25 Hx HTN, active  Adrenal Insufficiency - unfortunately received steroids before level, so empirically treated P:  prn labetalol oral labetalol   RENAL A:   Mixed metabolic acidosis - due to lactate & renal failure Acute renal failure -residual ATN diuresis Rhabdo - improving with IVF resuscitation Pseudohypocalcemia - corrects to wnl P:   d5w maintain Free water maintain Chem in am   GASTROINTESTINAL A:  Transaminitis - Improving GERD Obesity  Severe protein calorie malnutrition  diarhea P:   Pantoprazole. Tube feeds tolerated Imodium May need tincture  PEg needed  HEMATOLOGIC A:   Anemia VTE prophylaxis Thrombocytopenia - improving (after diuresis)- hemoconcentration Anemia P:  Cbc in am  sub q hep maintain  INFECTIOUS A:   Group A Strep Septic Shock P:   BCx2 2/22 >>NG Resp sputum >>> RARE GROUP A STREP (S.PYOGENES) ISOLATED Sputum 2/28- NF BC  3/2>>>  Levofloxacin 2/23>>>2/24 Clinda 2/24 >> 2/26 Vanc, start date 2/22>>>2/26 ---- Restarted 2/28 >>2/29 Zosyn, start date 2/22>>>2/26 Unasyn 2/26 >> IVIG 2/24 >> 2/25  Fever better with ceftaz vanc started Dc vanc as no mrsa noted Continued ceftaz course MRi neck neg  ENDOCRINE A:   R/o rel AI - cortisol 58 but had received dose IV  R/o sick euthryoid - TSH low: 0.258,  T3 Low (37); free T4 low (0.74)  P:   SSI while on tube feeds  NEUROLOGIC A:   Acute metabolic encephalopathy R/o anoxia post arrest Hx Depression, Anxiety, Pain Vent dyschrony MRi neck neg, quadraplegia unclear etiology - ischemia post arrest? R/o critical illness myopahty, neuropathy P:   Sedation:  Fentanyl gtt with WUA RASS goal: 0 MRi brain done, neg For MRI neck stat r/o abscess Need emg, per neuro  FAMILY  - Updates: family updated 2/28 am.  They are planning a trip and will be leaving the country on 3/1 depending on patients status.  Will need to discuss possibility of trach prior to leaving.  They are aware she may need this and are agreeable to procedure. Will need further discussion / consent via son.   Calling son to update abnd confirm trach, pegh  - Inter-disciplinary family meet or Palliative Care meeting due by:  2/28- done 3/1 meeting son   Ccm time 57 min   [name]. Titus Mould, MD, [place] Pgr: [facility]

## 2014-09-28 NOTE — Progress Notes (Signed)
Patient ID: RETINA Jamie Fields, female   DOB: 1955-09-23, 59 y.o.   MRN: [id number] S:intubated O:BP 137/66 mmHg  Pulse 83  Temp(Src) 97.2 F (36.2 C) (Oral)  Resp 20  Ht 5\' 7"  (1.702 m)  Wt 103.3 kg (227 lb 11.8 oz)  BMI 35.66 kg/m2  SpO2 100%  Intake/Output Summary (Last 24 hours) at 09/28/14 1058 Last data filed at 09/28/14 0800  Gross per 24 hour  Intake 6612.5 ml  Output   3725 ml  Net 2887.5 ml   Intake/Output: I/O last 3 completed shifts: In: 9792.5 [I.V.:5577.5; Blood:50; Other:135; NG/GT:3730; IV MPNTIRWER:154] Out: 0086 [Urine:6100; Stool:575]  Intake/Output this shift:  Total I/O In: 470 [I.V.:400; NG/GT:70] Out: 250 [Urine:250] Weight change: 0.5 kg (1 lb 1.6 oz) Gen:WD obese WF intubated in NAD CVS:no rub Resp:cta PYP:PJKDTO Ext:+edema   Recent Labs Lab 09/21/14 1659  09/23/14 0420  09/24/14 0413 09/24/14 1715 09/25/14 0428 09/25/14 1134 09/26/14 0459 09/26/14 1500 09/27/14 0450 09/27/14 1345 09/27/14 2028 09/28/14 0500  NA 135  < > 141  < > 139 139 144  --  151* 152* 153* 151* 148* 147*  K 4.4  < > 2.6*  < > 3.2* 3.1* 3.7  --  3.4* 3.1* 3.1* 3.2* 3.2* 4.1  CL 106  < > 102  < > 103 105 106  --  114* 116* 116* 113* 116* 116*  CO2 20  < > 28  < > 31 27 28   --  26 23 25 29 29 27   GLUCOSE 347*  < > 197*  < > 216* 188* 192*  --  166* 221* 213* 208* 255* 171*  BUN 107*  < > 113*  < > 96* 87* 103*  --  132* 123* 108* 99* 90* 78*  CREATININE 3.52*  < > 2.79*  < > 1.98* 1.67* 1.88*  --  2.39* 2.19* 1.88* 1.73* 1.60* 1.49*  ALBUMIN 1.2*  < > 1.5*  1.5*  < > 1.7* 1.9* 1.8* 1.8* 1.8* 1.7* 1.7*  --   --  1.7*  CALCIUM 9.7  < > 8.0*  < > 7.8* 8.0* 8.3*  --  7.7* 7.6* 8.0* 8.1* 8.3* 8.2*  PHOS  --   < > 4.4  < > 3.7 3.0 4.7*  --  5.9* 6.8* 7.2*  --   --  6.1*  AST 228*  --  132*  --   --   --   --  87* 100*  --  61*  --   --   --   ALT 219*  --  202*  --   --   --   --  145* 139*  --  102*  --   --   --   < > = values in this interval not displayed. Liver  Function Tests:  Recent Labs Lab 09/25/14 1134 09/26/14 0459 09/26/14 1500 09/27/14 0450 09/28/14 0500  AST 87* 100*  --  61*  --   ALT 145* 139*  --  102*  --   ALKPHOS 414* 472*  --  304*  --   BILITOT 2.3* 1.8*  --  1.4*  --   PROT 6.8 6.4  --  6.4  --   ALBUMIN 1.8* 1.8* 1.7* 1.7* 1.7*   No results for input(s): LIPASE, AMYLASE in the last 168 hours.  Recent Labs Lab 09/25/14 1156  AMMONIA 47*   CBC:  Recent Labs Lab 09/25/14 0428 09/26/14 0459 09/27/14 0450 09/27/14 1345 09/28/14 0500  WBC 39.2* 23.6* 14.3* 12.1* 12.3*  NEUTROABS  --  19.9* 12.3*  --   --   HGB 7.6* 7.1* 6.4* 7.5* 7.3*  HCT 22.6* 21.8* 19.9* 23.4* 23.0*  MCV 77.1* 81.0 84.3 86.7 87.5  PLT 151 154 142* 126* 129*   Cardiac Enzymes:  Recent Labs Lab 09/21/14 1659  TROPONINI 0.28*   CBG:  Recent Labs Lab 09/27/14 1621 09/27/14 1914 09/27/14 2353 09/28/14 0445 09/28/14 0711  GLUCAP 153* 208* 187* 141* 154*    Iron Studies: No results for input(s): IRON, TIBC, TRANSFERRIN, FERRITIN in the last 72 hours. Studies/Results: Mr Brain Wo Contrast  09/27/2014   CLINICAL DATA:  Anoxia, stroke, and altered mental status.  EXAM: MRI HEAD WITHOUT CONTRAST  TECHNIQUE: Multiplanar, multiecho pulse sequences of the brain and surrounding structures were obtained without intravenous contrast.  COMPARISON:  10/01/2011  FINDINGS: Calvarium and upper Jamie Fields: Chronic calvarial thickening. No focal marrow signal abnormality.  Orbits: No significant findings.  Sinuses: Paranasal sinuses are clear. There is bilateral mucosal thickening or effusions in the mastoid air cells.  Brain: No acute abnormality such as acute infarct, acute hemorrhage, hydrocephalus, or mass lesion. No evidence of large vessel occlusion.  There is no significant small-vessel ischemic change.  Linear hyperintense FLAIR signal scattered throughout the convexities of the bilateral cerebral hemispheres. No associated gradient signal  abnormality or high density on recent head CT to suggest subarachnoid hemorrhage. No gray matter edema or sulcal diffusion abnormality typical of meningitis. The patient is intubated and has Fields FiO2 Fields 50%, the most likely cause. If there is any clinical concern for meningitis or subarachnoid hemorrhage, lumbar puncture could be performed.  IMPRESSION: 1. Negative for infarct or other definite abnormality. 2. Sulcal FLAIR hyperintensity likely related to high FIO2 in this intubated patient. Please see discussion above.   Electronically Signed   By: Monte Fantasia M.D.   On: 09/27/2014 03:30   Mr Jamie Fields Wo Contrast  09/28/2014   CLINICAL DATA:  Initial evaluation for quadriparesis. Status post recent cardiac arrest.  EXAM: MRI Jamie Fields WITHOUT CONTRAST  TECHNIQUE: Multiplanar, multisequence MR imaging of the Jamie Fields was performed. No intravenous contrast was administered.  COMPARISON:  None available.  FINDINGS: Visualized portions of the brain and posterior fossa demonstrate Fields normal appearance with normal signal intensity. The craniocervical junction is widely patent.  Straightening of the normal Jamie lordosis with mild levoscoliosis is present. Vertebral body heights well maintained. Signal intensity within the vertebral body bone marrow is normal.  Signal intensity within the Jamie spinal cord is normal. No cord signal changes to suggest cord infarct or other acute abnormality.  There is mildly hyperintense T2/stir signal intensity within the bilateral paraspinous musculature extending from the C3-4 through C6-7 levels, best appreciated on sagittal STIR sequence (series 400, image 7). This finding is of uncertain significance. Normal intravascular flow voids present within the vertebral arteries bilaterally. Endotracheal and enteric tubes noted.  C2-3: Mild bilateral uncovertebral hypertrophy present without significant stenosis.  C3-4: Diffuse degenerative disc osteophyte with  bilateral uncovertebral spurring and right greater than left facet arthrosis. There is resultant moderate right with mild left foraminal stenosis. Posterior disc osteophyte indents and partially effaces the ventral thecal sac and results in moderate canal stenosis.  C4-5: Broad-based diffuse irregular degenerative disc osteophyte with bilateral uncovertebral spurring and facet arthrosis. There is resultant severe bilateral foraminal stenosis, slightly worse on the right. Posterior disc osteophyte largely effaces the ventral thecal sac and results in moderate  canal narrowing.  C5-6: Mild diffuse degenerative disc osteophyte with bilateral uncovertebral spurring and left-sided facet arthrosis. There is resultant mild right foraminal stenosis. More moderate left foraminal narrowing present due to uncovertebral spurring and facet disease. Posterior disc osteophyte indents the ventral thecal sac with secondary mild to moderate canal narrowing.  C6-7: Diffuse disc bulge with mild bilateral facet hypertrophy. The bulging disc effaces the ventral thecal sac with resultant moderate canal stenosis. There is moderate bilateral foraminal narrowing as well.  C7-T1: Mild bilateral uncovertebral spurring with facet hypertrophy. There is Fields superimposed more focal right paracentral disc osteophyte complex indenting the right ventral thecal sac. No significant canal stenosis. Foramina are patent.  IMPRESSION: 1. No cord signal abnormality identified within the Jamie Fields. 2. Multilevel degenerative disc disease as above. There is essentially resultant moderate canal stenosis extending from the C3-4 through C6-7 levels (most severe at C6-7). There is secondary multilevel bilateral foraminal narrowing, severe at C4-5. Please see the above report for Fields full description of these findings. 3. Mild edema within the posterior paraspinous musculature extending from C3-4 through C6-7. This finding is nonspecific, and of uncertain clinical  significance.   Electronically Signed   By: Jeannine Boga M.D.   On: 09/28/2014 02:09   Dg Chest Port 1 View  09/28/2014   CLINICAL DATA:  Increased work of breathing.  Subsequent encounter.  EXAM: PORTABLE CHEST - 1 VIEW  COMPARISON:  Chest radiograph performed earlier today at 4:48 Fields.m.  FINDINGS: The patient's endotracheal tube is seen ending 5-6 cm above the carina. The enteric tube is noted extended below the diaphragm. Fields right IJ dual-lumen catheter is noted ending overlying the proximal to mid SVC. Fields left IJ line is also seen ending overlying the proximal to mid SVC.  Minimal left basilar opacity likely reflects atelectasis. The lungs are otherwise grossly clear. No pleural effusion or pneumothorax is seen.  The cardiomediastinal silhouette is borderline normal in size. No acute osseous abnormalities are identified.  IMPRESSION: Minimal left basilar opacity likely reflects atelectasis; lungs otherwise clear.   Electronically Signed   By: Garald Balding M.D.   On: 09/28/2014 02:48   Dg Chest Port 1 View  09/27/2014   CLINICAL DATA:  Sepsis, followup  EXAM: PORTABLE CHEST - 1 VIEW  COMPARISON:  Portable chest x-ray of 09/26/2014  FINDINGS: The chest is overlain by artifacts, limiting assessment. The lungs are not quite as well aerated and there is mild basilar opacity at on the left most consistent with atelectasis. In addition there is some opacity overlying the left medial lung field adjacent the aortic knob, and followup chest x-ray is recommended. The patient is somewhat rotated. The tip of the endotracheal tube is approximately 5.7 cm above the carina. Left IJ central venous line tip overlies the left innominate vein, and right IJ catheter is unchanged in position.  IMPRESSION: 1. Slightly diminished aeration with left basilar atelectasis. 2. Haziness overlying the medial left upper lung field may be due to rotation but recommend followup chest x-ray.   Electronically Signed   By: Ivar Drape  M.D.   On: 09/27/2014 07:06   Dg Abd Portable 1v  09/28/2014   CLINICAL DATA:  Nasogastric tube placement  EXAM: PORTABLE ABDOMEN - 1 VIEW  COMPARISON:  None.  FINDINGS: The nasogastric tube tip is at the pylorus.  The bowel gas pattern is nonobstructive. Cholecystectomy changes noted. The visible left lung base is clear.  IMPRESSION: Nasogastric tube with tip at the pylorus.  Electronically Signed   By: Monte Fantasia M.D.   On: 09/28/2014 00:19   . white petrolatum      . antiseptic oral rinse  7 mL Mouth Rinse QID  . cefTAZidime (FORTAZ)  IV  2 g Intravenous Q12H  . chlorhexidine  15 mL Mouth Rinse BID  . etomidate      . free water  250 mL Per Tube Q4H  . heparin subcutaneous  5,000 Units Subcutaneous 3 times per day  . insulin aspart  0-20 Units Subcutaneous 6 times per day  . ipratropium-albuterol  3 mL Nebulization Q6H  . labetalol  200 mg Per Tube BID  . midazolam      . pantoprazole sodium  40 mg Per Tube Daily  . vecuronium        BMET    Component Value Date/Time   NA 147* 09/28/2014 0500   K 4.1 09/28/2014 0500   CL 116* 09/28/2014 0500   CO2 27 09/28/2014 0500   GLUCOSE 171* 09/28/2014 0500   BUN 78* 09/28/2014 0500   CREATININE 1.49* 09/28/2014 0500   CALCIUM 8.2* 09/28/2014 0500   GFRNONAA 37* 09/28/2014 0500   GFRAA 43* 09/28/2014 0500   CBC    Component Value Date/Time   WBC 12.3* 09/28/2014 0500   RBC 2.63* 09/28/2014 0500   HGB 7.3* 09/28/2014 0500   HCT 23.0* 09/28/2014 0500   PLT 129* 09/28/2014 0500   MCV 87.5 09/28/2014 0500   MCH 27.8 09/28/2014 0500   MCHC 31.7 09/28/2014 0500   RDW 20.4* 09/28/2014 0500   LYMPHSABS 1.4 09/27/2014 0450   MONOABS 0.6 09/27/2014 0450   EOSABS 0.0 09/27/2014 0450   BASOSABS 0.0 09/27/2014 0450     Assessment/Plan:  1. ARF in setting of sepsis and need for pressors- currently on CVVHD with marked increase in UOP. BP's have improved and she is off pressors, with negative 3 liters over the last 24 hours  and 16 liters over the last 48 hrs.  1. Scr improving off of dialysis with increased UOP most c/w post-ATN diuresis. Continue to follow UOP and daily labs 2. Hopefully she will not require IHD. 3. Nothing further to add, will sign off, please call with questions/concerns 2. VDRF- per PCCM for trach today 3. Group Fields strep sepsis- on antibiotics and off pressors. Also with erythema of right femoral area, CT scan was negative for necrotizing fasciitis. Cont with antibiotics and supportive care. 4. Volume overload- as above, marked diuresis, ok to hold CVVHD for now and follow since she has had good UOP. 5. Hypernatremia- despite free water boluses through OG/NGT at 250cc q4 and D5W at 100cc/hr her serum Na continues to climb.  1. Continuw D5@ to 200cc/hr and adjust rate based on sodium levels in am. 6. Hypokalemia- added KCl in IVF's and 78mEq per OGT 7. Anemia- transfuse prn. 8. Protein malnutrition. 9. AMS- able to open and close eyes upon demand  Jamie Fields

## 2014-09-29 DIAGNOSIS — E46 Unspecified protein-calorie malnutrition: Secondary | ICD-10-CM

## 2014-09-29 DIAGNOSIS — R4182 Altered mental status, unspecified: Secondary | ICD-10-CM

## 2014-09-29 LAB — RENAL FUNCTION PANEL
Albumin: 1.6 g/dL — ABNORMAL LOW (ref 3.5–5.2)
Anion gap: 4 — ABNORMAL LOW (ref 5–15)
BUN: 54 mg/dL — ABNORMAL HIGH (ref 6–23)
CO2: 24 mmol/L (ref 19–32)
Calcium: 8.3 mg/dL — ABNORMAL LOW (ref 8.4–10.5)
Chloride: 109 mmol/L (ref 96–112)
Creatinine, Ser: 1.22 mg/dL — ABNORMAL HIGH (ref 0.50–1.10)
GFR calc non Af Amer: 48 mL/min — ABNORMAL LOW (ref >90)
GFR, EST AFRICAN AMERICAN: 55 mL/min — AB (ref >90)
GLUCOSE: 174 mg/dL — AB (ref 70–99)
POTASSIUM: 3.8 mmol/L (ref 3.5–5.1)
Phosphorus: 4.6 mg/dL (ref 2.3–4.6)
Sodium: 137 mmol/L (ref 135–145)

## 2014-09-29 LAB — GLUCOSE, CAPILLARY
GLUCOSE-CAPILLARY: 147 mg/dL — AB (ref 70–99)
GLUCOSE-CAPILLARY: 135 mg/dL — AB (ref 70–99)
Glucose-Capillary: 159 mg/dL — ABNORMAL HIGH (ref 70–99)
Glucose-Capillary: 148 mg/dL — ABNORMAL HIGH (ref 70–99)
Glucose-Capillary: 160 mg/dL — ABNORMAL HIGH (ref 70–99)
Glucose-Capillary: 169 mg/dL — ABNORMAL HIGH (ref 70–99)

## 2014-09-29 LAB — PREPARE RBC (CROSSMATCH)

## 2014-09-29 LAB — MAGNESIUM: MAGNESIUM: 1.6 mg/dL (ref 1.5–2.5)

## 2014-09-29 MED ORDER — DEXTROSE 5 % IV SOLN
2.0000 g | Freq: Three times a day (TID) | INTRAVENOUS | Status: DC
  Administered 2014-09-29 – 2014-10-01 (×6): 2 g via INTRAVENOUS
  Filled 2014-09-29 (×8): qty 2

## 2014-09-29 MED ORDER — POTASSIUM CL IN DEXTROSE 5% 20 MEQ/L IV SOLN
20.0000 meq | INTRAVENOUS | Status: DC
  Administered 2014-09-29 – 2014-09-30 (×2): 20 meq via INTRAVENOUS
  Filled 2014-09-29 (×4): qty 1000

## 2014-09-29 MED ORDER — SODIUM CHLORIDE 0.9 % IV SOLN
Freq: Once | INTRAVENOUS | Status: AC
  Administered 2014-09-29: 13:00:00 via INTRAVENOUS

## 2014-09-29 NOTE — Progress Notes (Signed)
PULMONARY / CRITICAL CARE MEDICINE   Name: Jamie Fields MRN: [id number] DOB: [birth year]    ADMISSION DATE:  09/17/2014 CONSULTATION DATE:  09/17/2014   REFERRING MD :  [facility]  CHIEF COMPLAINT:  Respiratory Distress  INITIAL PRESENTATION: 59 y.o. F who was taken to [facility] ED early AM hours 2/22 for respiratory distress.  She was admitted and placed on BiPAP.  Was also found to have acute renal failure, AG metabolic acidosis, Overnight, her acidosis and respiratory status worsened to the point she required intubation.  She was also placed on levophed for persistent shock.  Later in day 2/22 was transferred to [facility] for further management.  STUDIES:  CT Chest 2/22 >> no acute process CT abd / pelv 2/22 >> iliac lymph nodes likely reactive.  No acute process CXR 2/23  >> Persistent mild vascular congestion and mild interstitial edema bilaterally. Superimposed infiltrate right upper lobe and left base cannot be excluded. Echo 5/46 >> Systolic function was normal. EF 60% to 65%. Right Tib/Fib DG >> No evidence of osteo; Mild soft tissue swelling RUQ [place] 2/23 >> Liver echogenicity is diffusely increased and somewhat heterogeneous. Most likely are indicative of hepatic steatosis CT tib/fib Rt >> Negative for osteomyelitis or necrotizing fasciitis. Superficial edema and blistering CXR 2/28 >> No change in bilateral airspace disease opacities representing multifocal pneumonia versus asymmetric edema 3/1 ct head>>>neg 3/1 eeg>>slow, enceph 3/2RI brain>>>neg acute, sulci flare, may be related to high fio2 3/3 awake, but not moving extremeties 3/3 mri neck - neg   SIGNIFICANT EVENTS: 2/22  Admitted at [place]; respiratory failure-intubated; shock; transferred to [facility] 2/23  On [place]; intubated; Fever, CXR infiltrate = sepsis 2/24  Group A strep, added Clinda; CT neg for nec fas; Tube feeds started 2/26  Hypoxia, brady arrest/ PEA in pm  2/27  CVVHD initiated  2/28  Pt net neg 6L;  New CXR infiltrate - Vanc restarted 2/29- cvvhd off, urine output increased 3/4 Trach   SUBJECTIVE: full awake , trach 3/4  Scr tr down and UOP tr up    VITAL SIGNS: Temp:  [98.6 F (37 C)-99.3 F (37.4 C)] 99.2 F (37.3 C) (03/05 1138) Pulse Rate:  [45-88] 82 (03/05 1020) Resp:  [14-28] 23 (03/05 1000) BP: (95-141)/(39-74) 130/52 mmHg (03/05 1020) SpO2:  [100 %] 100 % (03/05 1000) FiO2 (%):  [40 %-60 %] 40 % (03/05 0800) Weight:  [108.8 kg (239 lb 13.8 oz)] 108.8 kg (239 lb 13.8 oz) (03/05 0457)   HEMODYNAMICS:     VENTILATOR SETTINGS: Vent Mode:  [-] PRVC FiO2 (%):  [40 %-60 %] 40 % Set Rate:  [18 bmp] 18 bmp Vt Set:  [490 mL] 490 mL PEEP:  [5 cmH20] 5 cmH20 Plateau Pressure:  [14 cmH20-26 cmH20] 22 cmH20   INTAKE / OUTPUT:  Intake/Output Summary (Last 24 hours) at 09/29/14 1151 Last data filed at 09/29/14 1000  Gross per 24 hour  Intake   4885 ml  Output   3200 ml  Net   1685 ml   PHYSICAL EXAMINATION: General: Obese female, awake Neuro: not moving arms legs, awake, follows commands HEENT: [place]/AT. PERRL 3 mm Cardiovascular: RRR s1 s 2  Lungs: coarse Abdomen: Obese, BS x 4, soft, NT/ND.  Musculoskeletal: edema 1 plus Skin: RLE dressing c/d/i  LABS:  CBC  Recent Labs Lab 09/27/14 1345 09/28/14 0500 09/29/14 0451  WBC 12.1* 12.3* 10.5  HGB 7.5* 7.3* 6.9*  HCT 23.4* 23.0* 21.7*  PLT 126* 129*  154   Coag's No results for input(s): APTT, INR in the last 168 hours. BMET  Recent Labs Lab 09/27/14 2028 09/28/14 0500 09/29/14 0451  NA 148* 147* 137  K 3.2* 4.1 3.8  CL 116* 116* 109  CO2 29 27 24   BUN 90* 78* 54*  CREATININE 1.60* 1.49* 1.22*  GLUCOSE 255* 171* 174*   Electrolytes  Recent Labs Lab 09/27/14 0450  09/27/14 2028 09/28/14 0500 09/29/14 0451  CALCIUM 8.0*  < > 8.3* 8.2* 8.3*  MG 2.1  --   --  1.8 1.6  PHOS 7.2*  --   --  6.1* 4.6  < > = values in this interval not displayed.   Sepsis Markers  Recent Labs Lab  09/24/14 0937  LATICACIDVEN 1.6     ABG  Recent Labs Lab 09/23/14 0410 09/24/14 0943 09/25/14 0335  PHART 7.312* 7.393 7.401  PCO2ART 50.9* 45.2* 43.5  PO2ART 68.2* 115.0* 97.2   Liver Enzymes  Recent Labs Lab 09/25/14 1134 09/26/14 0459  09/27/14 0450 09/28/14 0500 09/29/14 0451  AST 87* 100*  --  61*  --   --   ALT 145* 139*  --  102*  --   --   ALKPHOS 414* 472*  --  304*  --   --   BILITOT 2.3* 1.8*  --  1.4*  --   --   ALBUMIN 1.8* 1.8*  < > 1.7* 1.7* 1.6*  < > = values in this interval not displayed. Cardiac Enzymes No results for input(s): TROPONINI, PROBNP in the last 168 hours. Glucose  Recent Labs Lab 09/28/14 1600 09/28/14 1950 09/29/14 0003 09/29/14 0400 09/29/14 0707 09/29/14 1056  GLUCAP 128* 146* 159* 148* 147* 160*    Imaging Ct Abdomen Wo Contrast  09/28/2014   CLINICAL DATA:  Cardiac arrest and respiratory failure. Request to place a percutaneous gastrostomy tube. Indwelling nasal feeding tube currently present. CT is performed to assess anatomy prior to potential percutaneous gastrostomy.  EXAM: CT ABDOMEN WITHOUT CONTRAST  TECHNIQUE: Multidetector CT imaging of the abdomen was performed following the standard protocol without IV contrast.  COMPARISON:  Abdominal film earlier today.  FINDINGS: A post pyloric feeding tube extends into the second portion of the duodenum. The stomach is midline and anterior and positioned immediately deep to the abdominal wall. Anatomy is favorable for gastrostomy tube placement. There is no evidence of significant ileus or bowel obstruction. No colonic interposition between the stomach and abdominal wall.  Unenhanced appearance of the solid organs is unremarkable. No free air or abnormal fluid collections identified. No incidental masses, enlarged lymph nodes or hernias. Both lung bases demonstrate lower lobe atelectasis. No bony abnormalities are seen.  IMPRESSION: Midline an anterior stomach immediately deep to the  abdominal wall. Anatomy should be favorable for safe percutaneous gastrostomy tube placement. Current feeding tube extends into the second portion of the duodenum.   Electronically Signed   By: Aletta Edouard M.D.   On: 09/28/2014 17:08   Mr Cervical Spine Wo Contrast  09/28/2014   CLINICAL DATA:  Initial evaluation for quadriparesis. Status post recent cardiac arrest.  EXAM: MRI CERVICAL SPINE WITHOUT CONTRAST  TECHNIQUE: Multiplanar, multisequence MR imaging of the cervical spine was performed. No intravenous contrast was administered.  COMPARISON:  None available.  FINDINGS: Visualized portions of the brain and posterior fossa demonstrate a normal appearance with normal signal intensity. The craniocervical junction is widely patent.  Straightening of the normal cervical lordosis with mild levoscoliosis is present. Vertebral  body heights well maintained. Signal intensity within the vertebral body bone marrow is normal.  Signal intensity within the cervical spinal cord is normal. No cord signal changes to suggest cord infarct or other acute abnormality.  There is mildly hyperintense T2/stir signal intensity within the bilateral paraspinous musculature extending from the C3-4 through C6-7 levels, best appreciated on sagittal STIR sequence (series 400, image 7). This finding is of uncertain significance. Normal intravascular flow voids present within the vertebral arteries bilaterally. Endotracheal and enteric tubes noted.  C2-3: Mild bilateral uncovertebral hypertrophy present without significant stenosis.  C3-4: Diffuse degenerative disc osteophyte with bilateral uncovertebral spurring and right greater than left facet arthrosis. There is resultant moderate right with mild left foraminal stenosis. Posterior disc osteophyte indents and partially effaces the ventral thecal sac and results in moderate canal stenosis.  C4-5: Broad-based diffuse irregular degenerative disc osteophyte with bilateral uncovertebral  spurring and facet arthrosis. There is resultant severe bilateral foraminal stenosis, slightly worse on the right. Posterior disc osteophyte largely effaces the ventral thecal sac and results in moderate canal narrowing.  C5-6: Mild diffuse degenerative disc osteophyte with bilateral uncovertebral spurring and left-sided facet arthrosis. There is resultant mild right foraminal stenosis. More moderate left foraminal narrowing present due to uncovertebral spurring and facet disease. Posterior disc osteophyte indents the ventral thecal sac with secondary mild to moderate canal narrowing.  C6-7: Diffuse disc bulge with mild bilateral facet hypertrophy. The bulging disc effaces the ventral thecal sac with resultant moderate canal stenosis. There is moderate bilateral foraminal narrowing as well.  C7-T1: Mild bilateral uncovertebral spurring with facet hypertrophy. There is a superimposed more focal right paracentral disc osteophyte complex indenting the right ventral thecal sac. No significant canal stenosis. Foramina are patent.  IMPRESSION: 1. No cord signal abnormality identified within the cervical spine. 2. Multilevel degenerative disc disease as above. There is essentially resultant moderate canal stenosis extending from the C3-4 through C6-7 levels (most severe at C6-7). There is secondary multilevel bilateral foraminal narrowing, severe at C4-5. Please see the above report for a full description of these findings. 3. Mild edema within the posterior paraspinous musculature extending from C3-4 through C6-7. This finding is nonspecific, and of uncertain clinical significance.   Electronically Signed   By: Jeannine Boga M.D.   On: 09/28/2014 02:09   Dg Chest Port 1 View  09/28/2014   CLINICAL DATA:  Pneumonia.  EXAM: PORTABLE CHEST - 1 VIEW  COMPARISON:  September 27, 2014.  FINDINGS: Stable cardiomediastinal silhouette. Endotracheal tube has been removed and replaced with tracheostomy tube, which appears to be  in grossly good position. Nasogastric tube is unchanged. Bilateral internal jugular catheters are unchanged with distal tips overlying expected position of the SVC. No pneumothorax is noted. Increased right midlung opacity is noted concerning for pneumonia or subsegmental atelectasis. Mild central pulmonary vascular congestion is noted. Increased bibasilar opacities are noted concerning for atelectasis or edema.  IMPRESSION: Mild central pulmonary vascular congestion is noted with increased mild bibasilar densities concerning for edema or atelectasis. Tracheostomy tube is in grossly good position. New right midlung opacity is noted concerning for or atelectasis.   Electronically Signed   By: Marijo Conception, M.D.   On: 09/28/2014 13:33   Dg Abd Portable 1v  09/28/2014   CLINICAL DATA:  Feeding tube placement. Shock. Acute respiratory failure and acute renal failure.  EXAM: PORTABLE ABDOMEN - 1 VIEW  COMPARISON:  09/27/2014  FINDINGS: Feeding tube is now seen with tip overlying distal stomach at  the level of pylorus or duodenal bulb. No evidence of dilated bowel loops.  IMPRESSION: Feeding tube tip overlies the distal stomach, at the pylorus or duodenal bulb.   Electronically Signed   By: Earle Gell M.D.   On: 09/28/2014 13:47   ASSESSMENT / PLAN:  PULMONARY OETT 2/22 >>> A: Acute hypoxic respiratory failure s/p tracho 3/4  COPD without evidence of exacerbation ARDS P:   Cont to evaluate for wean   pcxr in am   CARDIOVASCULAR CVL R fem 2/22 >> 2/23 CVL L IJ 2/23 >> Aline L rad 2/23 >> A:  Shock - Septic +/-  Hypovolemic>improved remains off pressors  Lactic Acidosis - resolved 2/25 Hx HTN, active  Adrenal Insufficiency - unfortunately received steroids before level, so empirically treated P:  prn labetalol oral labetalol   RENAL A:   Mixed metabolic acidosis - due to lactate & renal failure Acute renal failure -residual ATN diuresis Rhabdo - improving with IVF  resuscitation Pseudohypocalcemia - corrects to wnl Hypernatremia -much improved  P:   Decrease D5W 100cc  Free water maintain Chem in am  Check CK level in am   GASTROINTESTINAL A:  Transaminitis - Improving GERD Obesity  Severe protein calorie malnutrition  diarhea -C diff neg x 1  P:   Pantoprazole. Tube feeds tolerated Imodium PEg needed ? Next week   HEMATOLOGIC A:   Anemia -chronic dz -s/p PRBC x 1 on 3/3  VTE prophylaxis Thrombocytopenia - improving  Anemia P:  PRBC x 1  Cbc in am  sub q hep maintain  INFECTIOUS A:   Group A Strep Septic Shock P:   BCx2 2/22 >>NG Resp sputum >>> RARE GROUP A STREP (S.PYOGENES) ISOLATED Sputum 2/28- NF BC 3/2>>>  Levofloxacin 2/23>>>2/24 Clinda 2/24 >> 2/26 Vanc, start date 2/22>>>2/26 ---- Restarted 2/28 >>2/29 Zosyn, start date 2/22>>>2/26 Unasyn 2/26 >> IVIG 2/24 >> 2/25  Fever better  Continued ceftaz course MRi neck neg  ENDOCRINE A:   R/o rel AI - cortisol 58 but had received dose IV  R/o sick euthryoid - TSH low: 0.258,  T3 Low (37); free T4 low (0.74)  P:   SSI while on tube feeds  NEUROLOGIC A:   Acute metabolic encephalopathy R/o anoxia post arrest Hx Depression, Anxiety, Pain Vent dyschrony MRi neck neg, quadraplegia unclear etiology - ischemia post arrest? R/o critical illness myopahty, neuropathy P:   Sedation:  Fentanyl /diprivan  RASS goal: 0 MRi brain done, neg Need emg, per neuro-consider next week  Check CK in am   FAMILY  - Updates: family updated 2/28 am.  They are planning a trip and will be leaving the country on 3/1 depending on patients status.  Will need to discuss possibility of trach prior to leaving.  They are aware she may need this and are agreeable to procedure. Will need further discussion / consent via son.   Calling son to update abnd confirm trach, pegh  - Inter-disciplinary family meet or Palliative Care meeting due by:  2/28- done 3/1 meeting son   Tammy  Parrett NP-C  [facility] [place]  [phone]    Attending:  I have seen and examined the patient with nurse practitioner/resident and agree with the note above.   On my exam she is comfortable on the vent, rhonchi bilaterally Neuro: awake, not moving ext  Impression:  ARDS s/p trach > wean vent Paralysis> worrisome for cord injury, consider central pontine demyelination Hypernatremia> decrease D5 rate  My cc  time 35 minutes  Roselie Awkward, MD [place] Pager: [phone] Cell: [phone] If no response, call [phone]

## 2014-09-29 NOTE — Progress Notes (Signed)
CRITICAL VALUE ALERT  Critical value received:  Hg 6.9  Date of notification:  09/29/14  Time of notification:  0515  Critical value read back:Yes.    Nurse who received alert:  Mila Palmer  MD notified (1st page):  [place], E  Time of first page:  0515  Responding MD:  Patricia Nettle  Time MD responded:  Willie.Bhat   Per E-Link MD "Let the rounding physician decide."

## 2014-09-29 NOTE — Progress Notes (Signed)
ANTIBIOTIC CONSULT NOTE   Pharmacy Consult for ceftaz Indication: GAS bacteremia, pna, fevers  No Known Allergies  Patient Measurements: Height: 5\' 7"  (170.2 cm) Weight: 239 lb 13.8 oz (108.8 kg) IBW/kg (Calculated) : 61.6  Vital Signs: Temp: 98.9 F (37.2 C) (03/05 0739) Temp Source: Oral (03/05 0739) BP: 137/55 mmHg (03/05 0600) Pulse Rate: 88 (03/05 0600) Intake/Output from previous day: 03/04 0701 - 03/05 0700 In: 5190 [I.V.:4880; NG/GT:210; IV Piggyback:100] Out: 3400 [Urine:3400] Intake/Output from this shift:    Labs:  Recent Labs  09/27/14 1345 09/27/14 2028 09/28/14 0500 09/29/14 0451  WBC 12.1*  --  12.3* 10.5  HGB 7.5*  --  7.3* 6.9*  PLT 126*  --  129* 154  CREATININE 1.73* 1.60* 1.49* 1.22*   Estimated Creatinine Clearance: 63.1 mL/min (by C-G formula based on Cr of 1.22). No results for input(s): VANCOTROUGH, VANCOPEAK, VANCORANDOM, GENTTROUGH, GENTPEAK, GENTRANDOM, TOBRATROUGH, TOBRAPEAK, TOBRARND, AMIKACINPEAK, AMIKACINTROU, AMIKACIN in the last 72 hours.   Microbiology: Recent Results (from the past 720 hour(s))  MRSA PCR Screening     Status: None   Collection Time: 09/17/14  9:25 PM  Result Value Ref Range Status   MRSA by PCR NEGATIVE NEGATIVE Final    Comment:        The GeneXpert MRSA Assay (FDA approved for NASAL specimens only), is one component of a comprehensive MRSA colonization surveillance program. It is not intended to diagnose MRSA infection nor to guide or monitor treatment for MRSA infections.   Culture, Urine     Status: None   Collection Time: 09/18/14  8:26 AM  Result Value Ref Range Status   Specimen Description URINE, CATHETERIZED  Final   Special Requests Normal  Final   Colony Count NO GROWTH Performed at [facility]   Final   Culture NO GROWTH Performed at [facility]   Final   Report Status 09/19/2014 FINAL  Final  Blood culture (routine x 2)     Status: None   Collection Time: 09/18/14   9:05 AM  Result Value Ref Range Status   Specimen Description BLOOD LEFT HAND  Final   Special Requests BOTTLES DRAWN AEROBIC ONLY [place]  Final   Culture   Final    NO GROWTH 5 DAYS Performed at [facility]    Report Status 09/24/2014 FINAL  Final  Respiratory virus panel (routine influenza)     Status: None   Collection Time: 09/18/14  9:12 AM  Result Value Ref Range Status   Source - RVPAN NOSE  Corrected   Respiratory Syncytial Virus A Negative Negative Final   Respiratory Syncytial Virus B Negative Negative Final   Influenza A Negative Negative Final   Influenza B Negative Negative Final   Parainfluenza 1 Negative Negative Final   Parainfluenza 2 Negative Negative Final   Parainfluenza 3 Negative Negative Final   Metapneumovirus Negative Negative Final   Rhinovirus Negative Negative Final   Adenovirus Negative Negative Final    Comment: (NOTE) Performed At: [facility] [place], [place] [id number] Lindon Romp MD [id number]   Blood culture (routine x 2)     Status: None   Collection Time: 09/18/14 10:30 AM  Result Value Ref Range Status   Specimen Description BLOOD LEFT CENTRAL LINE  Final   Special Requests BOTTLES DRAWN AEROBIC AND ANAEROBIC 10CC  Final   Culture   Final    NO GROWTH 5 DAYS Performed at [facility]    Report  Status 09/24/2014 FINAL  Final  Culture, respiratory (NON-Expectorated)     Status: None   Collection Time: 09/18/14 11:34 AM  Result Value Ref Range Status   Specimen Description TRACHEAL ASPIRATE  Final   Special Requests NONE  Final   Gram Stain   Final    ABUNDANT WBC PRESENT,BOTH PMN AND MONONUCLEAR RARE SQUAMOUS EPITHELIAL CELLS PRESENT RARE YEAST Performed at [facility]    Culture   Final    FEW YEAST CONSISTENT WITH CANDIDA SPECIES RARE GROUP A STREP (S.PYOGENES) ISOLATED Performed at [facility]    Report Status 09/20/2014 FINAL  Final  Blood culture (routine x  2)     Status: None   Collection Time: 09/19/14  5:00 PM  Result Value Ref Range Status   Specimen Description BLOOD RIGHT ARM  Final   Special Requests BOTTLES DRAWN AEROBIC ONLY 10CC  Final   Culture   Final    NO GROWTH 5 DAYS Performed at [facility]    Report Status 09/26/2014 FINAL  Final  Blood culture (routine x 2)     Status: None   Collection Time: 09/19/14  5:20 PM  Result Value Ref Range Status   Specimen Description BLOOD RIGHT HAND  Final   Special Requests BOTTLES DRAWN AEROBIC ONLY 5CC  Final   Culture   Final    NO GROWTH 5 DAYS Performed at [facility]    Report Status 09/26/2014 FINAL  Final  Culture, respiratory (NON-Expectorated)     Status: None   Collection Time: 09/23/14 11:33 AM  Result Value Ref Range Status   Specimen Description ENDOTRACHEAL  Final   Special Requests NONE  Final   Gram Stain   Final    FEW WBC PRESENT,BOTH PMN AND MONONUCLEAR RARE SQUAMOUS EPITHELIAL CELLS PRESENT RARE YEAST Performed at [facility]    Culture   Final    Non-Pathogenic Oropharyngeal-type Flora Isolated. Performed at [facility]    Report Status 09/25/2014 FINAL  Final  Clostridium Difficile by PCR     Status: None   Collection Time: 09/24/14  9:28 AM  Result Value Ref Range Status   C difficile by pcr NEGATIVE NEGATIVE Final  Culture, blood (routine x 2)     Status: None (Preliminary result)   Collection Time: 09/26/14  1:50 PM  Result Value Ref Range Status   Specimen Description BLOOD LEFT HAND  Final   Special Requests BOTTLES DRAWN AEROBIC ONLY 5CC  Final   Culture   Final           BLOOD CULTURE RECEIVED NO GROWTH TO DATE CULTURE WILL BE HELD FOR 5 DAYS BEFORE ISSUING A FINAL NEGATIVE REPORT Performed at [facility]    Report Status PENDING  Incomplete  Culture, blood (routine x 2)     Status: None (Preliminary result)   Collection Time: 09/26/14  1:52 PM  Result Value Ref Range Status   Specimen  Description BLOOD RIGHT HAND  Final   Special Requests BOTTLES DRAWN AEROBIC ONLY 5CC  Final   Culture   Final           BLOOD CULTURE RECEIVED NO GROWTH TO DATE CULTURE WILL BE HELD FOR 5 DAYS BEFORE ISSUING A FINAL NEGATIVE REPORT Performed at [facility]    Report Status PENDING  Incomplete    Assessment: 59 y.o. female on ceftazidime for GAS bacteremia. Today is D#4 ceftazidime and D#12 total abx. Pt is now afebrile  and WBC is trending down. Scr is also improving down to 1.22, CrCl ~ 60.   Ceftaz 3/2>> 2/22 Vanc >>2/26; 2/28>>2/29; 3/2>>3/4 2/22 Zosyn >>2/26 2/22 Levaquin >>2/24 2/23 Tamiflu >>2/24 2/24 Clindamycin >>2/26 Unasyn 2/26>>3/2  3/2 BCx2 >> ngtd 2/23 MRSA PCR neg 2/23 Flu neg 2/23 Urine >>neg 2/23 TA >>few candida albicans; rare GAS 2/23 Blood >> ngtd [place] blood >> 1/2 GAS (did not conduct sensitivities)  Goal of Therapy:  Resolution of infection  Vancomycin trough Goal 15-20 mcg/ml  Plan:  -Change ceftazidime to 2gm IV Q8H -Will follow renal function, cultures and clinical progress  Hildred Laser, Pharm D 09/29/2014 9:39 AM

## 2014-09-29 NOTE — Progress Notes (Signed)
Subjective: Continues to improve per nurse. More alert today.   Exam: Filed Vitals:   09/29/14 1800  BP: 128/77  Pulse: 88  Temp:   Resp: 32   Gen: In bed, NAD MS: Opens eyes spontaneously, tracts my finger, shrugs shoulders to command. W[place]:OVZCH, blinks to threat Motor: shrugs left shoulder but unable to move bilateral arms or legs Sensory: grimaces to noxious stimuli bilateral arms and legs DTR: 3+ with spread and crossed adductors.    Impression: 59 yo F with AMS following cardiac arrest. She has markedly improved in mental status, but remains quadriplegic. She endorses sensation in arms. Her hyperreflexia would argue for an upper motor neuron cause.  I would be concerned for spinal injury during arrest(hypotensive).  Also possible would be CPM, though this was not apparent on the initial MRI brain.   Recommendations: 1) Agree with continued supportive care.    Roland Rack, MD [facility] [phone]  If 7pm- 7am, please page neurology on call as listed in [place].

## 2014-09-30 ENCOUNTER — Inpatient Hospital Stay (HOSPITAL_COMMUNITY): Payer: Commercial Managed Care - HMO

## 2014-09-30 LAB — CBC
HCT: 22.4 % — ABNORMAL LOW (ref 36.0–46.0)
Hemoglobin: 7.4 g/dL — ABNORMAL LOW (ref 12.0–15.0)
MCH: 28 pg (ref 26.0–34.0)
MCHC: 33 g/dL (ref 30.0–36.0)
MCV: 84.8 fL (ref 78.0–100.0)
Platelets: 188 10*3/uL (ref 150–400)
RBC: 2.64 MIL/uL — AB (ref 3.87–5.11)
RDW: 22.3 % — AB (ref 11.5–15.5)
WBC: 9.6 10*3/uL (ref 4.0–10.5)

## 2014-09-30 LAB — GLUCOSE, CAPILLARY
GLUCOSE-CAPILLARY: 117 mg/dL — AB (ref 70–99)
Glucose-Capillary: 106 mg/dL — ABNORMAL HIGH (ref 70–99)
Glucose-Capillary: 133 mg/dL — ABNORMAL HIGH (ref 70–99)
Glucose-Capillary: 137 mg/dL — ABNORMAL HIGH (ref 70–99)
Glucose-Capillary: 159 mg/dL — ABNORMAL HIGH (ref 70–99)

## 2014-09-30 LAB — BASIC METABOLIC PANEL
ANION GAP: 6 (ref 5–15)
BUN: 51 mg/dL — ABNORMAL HIGH (ref 6–23)
CALCIUM: 8.4 mg/dL (ref 8.4–10.5)
CO2: 24 mmol/L (ref 19–32)
CREATININE: 1.14 mg/dL — AB (ref 0.50–1.10)
Chloride: 104 mmol/L (ref 96–112)
GFR calc Af Amer: 60 mL/min — ABNORMAL LOW (ref >90)
GFR, EST NON AFRICAN AMERICAN: 52 mL/min — AB (ref >90)
Glucose, Bld: 147 mg/dL — ABNORMAL HIGH (ref 70–99)
Potassium: 3.9 mmol/L (ref 3.5–5.1)
Sodium: 134 mmol/L — ABNORMAL LOW (ref 135–145)

## 2014-09-30 LAB — TYPE AND SCREEN
ABO/RH(D): O POS
Antibody Screen: POSITIVE
DAT, COMPLEMENT: NEGATIVE
DAT, IGG: POSITIVE
DONOR AG TYPE: NEGATIVE
Donor AG Type: NEGATIVE
Unit division: 0
Unit division: 0

## 2014-09-30 LAB — CK TOTAL AND CKMB (NOT AT ARMC)
CK TOTAL: 172 U/L (ref 7–177)
CK, MB: 10.8 ng/mL — AB (ref 0.3–4.0)
Relative Index: 6.3 — ABNORMAL HIGH (ref 0.0–2.5)

## 2014-09-30 LAB — MAGNESIUM: Magnesium: 1.6 mg/dL (ref 1.5–2.5)

## 2014-09-30 MED ORDER — MAGNESIUM SULFATE 2 GM/50ML IV SOLN
2.0000 g | Freq: Once | INTRAVENOUS | Status: AC
  Administered 2014-09-30: 2 g via INTRAVENOUS
  Filled 2014-09-30: qty 50

## 2014-09-30 MED ORDER — FREE WATER
250.0000 mL | Freq: Three times a day (TID) | Status: DC
  Administered 2014-09-30 – 2014-10-11 (×32): 250 mL

## 2014-09-30 MED ORDER — FUROSEMIDE 10 MG/ML IJ SOLN
20.0000 mg | Freq: Once | INTRAMUSCULAR | Status: AC
  Administered 2014-09-30: 20 mg via INTRAVENOUS
  Filled 2014-09-30: qty 2

## 2014-09-30 NOTE — Progress Notes (Signed)
CRITICAL VALUE ALERT  Critical value received:  CKMB 10.8  Date of notification:  09/30/14  Time of notification:  0625  Critical value read back:Yes.    Nurse who received alert:  Mila Palmer  MD notified (1st page):  [place], E  Time of first page:  0630  Responding MD:  [place], E  Time MD responded:  [id number]

## 2014-09-30 NOTE — Progress Notes (Signed)
Pt transported to MRI & placed on vent during procedure. Pt tolerating well at this time. Will continue to monitor pt progress.

## 2014-09-30 NOTE — Progress Notes (Signed)
PULMONARY / CRITICAL CARE MEDICINE   Name: Jamie Fields MRN: [id number] DOB: January 19, 1956    ADMISSION DATE:  09/17/2014 CONSULTATION DATE:  09/17/2014   REFERRING MD :  [facility]  CHIEF COMPLAINT:  Respiratory Distress  INITIAL PRESENTATION: 59 y.o. F who was taken to [facility] ED early AM hours 2/22 for respiratory distress.  She was admitted and placed on BiPAP.  Was also found to have acute renal failure, AG metabolic acidosis, Overnight, her acidosis and respiratory status worsened to the point she required intubation.  She was also placed on levophed for persistent shock.  Later in day 2/22 was transferred to [facility] for further management.  STUDIES:  CT Chest 2/22 >> no acute process CT abd / pelv 2/22 >> iliac lymph nodes likely reactive.  No acute process CXR 2/23  >> Persistent mild vascular congestion and mild interstitial edema bilaterally. Superimposed infiltrate right upper lobe and left base cannot be excluded. Echo 6/23 >> Systolic function was normal. EF 60% to 65%. Right Tib/Fib DG >> No evidence of osteo; Mild soft tissue swelling RUQ [place] 2/23 >> Liver echogenicity is diffusely increased and somewhat heterogeneous. Most likely are indicative of hepatic steatosis CT tib/fib Rt >> Negative for osteomyelitis or necrotizing fasciitis. Superficial edema and blistering CXR 2/28 >> No change in bilateral airspace disease opacities representing multifocal pneumonia versus asymmetric edema 3/1 ct head>>>neg 3/1 eeg>>slow, enceph 3/2RI brain>>>neg acute, sulci flare, may be related to high fio2 3/3 awake, but not moving extremeties 3/3 mri neck - neg   SIGNIFICANT EVENTS: 2/22  Admitted at [place]; respiratory failure-intubated; shock; transferred to [facility] 2/23  On [place]; intubated; Fever, CXR infiltrate = sepsis 2/24  Group A strep, added Clinda; CT neg for nec fas; Tube feeds started 2/26  Hypoxia, brady arrest/ PEA in pm  2/27  CVVHD initiated  2/28  Pt net neg 6L;  New CXR infiltrate - Vanc restarted 2/29- cvvhd off, urine output increased 3/4 Trach  3/5 -1 u PRBC   SUBJECTIVE:  full awake , trach 3/4 , trach collar this am, good sats  Scr tr down and UOP tr up  I/O Bal remains + up 4L x 48 hr  Able to shrug shoulder/ wiggle toes CK total back to norm    VITAL SIGNS: Temp:  [98.7 F (37.1 C)-100.1 F (37.8 C)] 99.1 F (37.3 C) (03/06 0740) Pulse Rate:  [74-95] 95 (03/06 0900) Resp:  [15-46] 27 (03/06 0900) BP: (101-138)/(45-83) 128/68 mmHg (03/06 0900) SpO2:  [98 %-100 %] 100 % (03/06 0900) FiO2 (%):  [40 %] 40 % (03/06 0742) Weight:  [111.8 kg (246 lb 7.6 oz)] 111.8 kg (246 lb 7.6 oz) (03/06 0500)   HEMODYNAMICS:     VENTILATOR SETTINGS: Vent Mode:  [-] PRVC FiO2 (%):  [40 %] 40 % Set Rate:  [18 bmp] 18 bmp Vt Set:  [490 mL] 490 mL PEEP:  [5 cmH20] 5 cmH20 Pressure Support:  [16 cmH20] 16 cmH20 Plateau Pressure:  [20 cmH20] 20 cmH20   INTAKE / OUTPUT:  Intake/Output Summary (Last 24 hours) at 09/30/14 1038 Last data filed at 09/30/14 0900  Gross per 24 hour  Intake   4844 ml  Output   3375 ml  Net   1469 ml   PHYSICAL EXAMINATION: General: Obese female, awake Neuro: not moving arms legs, awake, follows commands, shrug shoulder/wiggle toes HEENT: [place]/AT. PERRL 3 mm Cardiovascular: RRR s1 s 2  Lungs: coarse Abdomen: Obese, BS x 4, soft,  NT/ND.  Musculoskeletal: edema 1 plus Skin: RLE dressing c/d/i  LABS:  CBC  Recent Labs Lab 09/28/14 0500 09/29/14 0451 09/30/14 0506  WBC 12.3* 10.5 9.6  HGB 7.3* 6.9* 7.4*  HCT 23.0* 21.7* 22.4*  PLT 129* 154 188   Coag's No results for input(s): APTT, INR in the last 168 hours. BMET  Recent Labs Lab 09/28/14 0500 09/29/14 0451 09/30/14 0506  NA 147* 137 134*  K 4.1 3.8 3.9  CL 116* 109 104  CO2 27 24 24   BUN 78* 54* 51*  CREATININE 1.49* 1.22* 1.14*  GLUCOSE 171* 174* 147*   Electrolytes  Recent Labs Lab 09/27/14 0450  09/28/14 0500 09/29/14 0451  09/30/14 0506  CALCIUM 8.0*  < > 8.2* 8.3* 8.4  MG 2.1  --  1.8 1.6 1.6  PHOS 7.2*  --  6.1* 4.6  --   < > = values in this interval not displayed.   Sepsis Markers  Recent Labs Lab 09/24/14 0937  LATICACIDVEN 1.6     ABG  Recent Labs Lab 09/24/14 0943 09/25/14 0335  PHART 7.393 7.401  PCO2ART 45.2* 43.5  PO2ART 115.0* 97.2   Liver Enzymes  Recent Labs Lab 09/25/14 1134 09/26/14 0459  09/27/14 0450 09/28/14 0500 09/29/14 0451  AST 87* 100*  --  61*  --   --   ALT 145* 139*  --  102*  --   --   ALKPHOS 414* 472*  --  304*  --   --   BILITOT 2.3* 1.8*  --  1.4*  --   --   ALBUMIN 1.8* 1.8*  < > 1.7* 1.7* 1.6*  < > = values in this interval not displayed. Cardiac Enzymes No results for input(s): TROPONINI, PROBNP in the last 168 hours. Glucose  Recent Labs Lab 09/29/14 1056 09/29/14 1517 09/29/14 2009 09/30/14 0040 09/30/14 0458 09/30/14 0703  GLUCAP 160* 135* 169* 159* 133* 117*    Imaging No results found. ASSESSMENT / PLAN:  PULMONARY OETT 2/22 >>> A: Acute hypoxic respiratory failure s/p tracho 3/4  COPD without evidence of exacerbation ARDS 3/6 CXR edema /vol overload  P:   Trach collar as tolerated w/ overnight vent rest   pcxr in am  Lasix 20mg  IV x 1   CARDIOVASCULAR CVL R fem 2/22 >> 2/23 CVL L IJ 2/23 >> Aline L rad 2/23 >> A:  Shock - Septic +/-  Hypovolemic>improved remains off pressors 3/5  Lactic Acidosis - resolved 2/25 Hx HTN, active  Adrenal Insufficiency - unfortunately received steroids before level, so empirically treated P:  prn labetalol oral labetalol  Stress steroids   RENAL A:   Mixed metabolic acidosis - due to lactate & renal failure Acute renal failure -residual ATN diuresis Rhabdo - improving with IVF resuscitation>ck return nml  Pseudohypocalcemia - corrects to wnl Hypernatremia -Resolved  P:   D/C  D5W 100cc  Free water -decrease  Chem in am     GASTROINTESTINAL A:  Transaminitis -  Improving GERD Obesity  Severe protein calorie malnutrition  diarhea -C diff neg x 1  P:   Pantoprazole. Tube feeds tolerated Imodium PEg needed ? Next week   HEMATOLOGIC A:   Anemia -chronic dz -s/p PRBC x 1 on 3/3 , 3/5  VTE prophylaxis Thrombocytopenia - improving   P:  Cbc in am  sub q hep maintain  INFECTIOUS A:   Group A Strep Septic Shock P:   BCx2 2/22 >>NG Resp sputum >>> RARE GROUP A  STREP (S.PYOGENES) ISOLATED Sputum 2/28- NF BC 3/2>>>  Levofloxacin 2/23>>>2/24 Clinda 2/24 >> 2/26 Vanc, start date 2/22>>>2/26 ---- Restarted 2/28 >>2/29 Zosyn, start date 2/22>>>2/26 Unasyn 2/26 >> IVIG 2/24 >> 2/25  Fever better /WBC improved  Continued ceftaz course MRi neck neg  ENDOCRINE A:   R/o rel AI - cortisol 58 but had received dose IV  R/o sick euthryoid - TSH low: 0.258,  T3 Low (37); free T4 low (0.74)  P:   SSI while on tube feeds  NEUROLOGIC A:   Acute metabolic encephalopathy R/o anoxia post arrest Hx Depression, Anxiety, Pain Vent dyschrony MRi neck neg, quadraplegia unclear etiology - ischemia post arrest? R/o critical illness myopahty, neuropathy>neuro following , repeat MRI per neuro   P:   Sedation:  Fentanyl /diprivan  RASS goal: 0 MRi repeat per Neuro  Need emg, per neuro-consider next week  Check CK in am   FAMILY  - Updates: family updated 2/28 am.  They are planning a trip and will be leaving the country on 3/1 depending on patients status.  Will need to discuss possibility of trach prior to leaving.  They are aware she may need this and are agreeable to procedure. Will need further discussion / consent via son.   Calling son to update abnd confirm trach, pegh  - Inter-disciplinary family meet or Palliative Care meeting due by:  2/28- done 3/1 meeting son  -no family 3/5 -3/6   Tammy Parrett NP-C  [facility]  [phone]      Attending:  I have seen and examined the patient with nurse  practitioner/resident and agree with the note above.   She moved her fingers and toes today which is a good sign and she was able to wean on ATC some CK OK, sodium now low  Stop D5, continue weaning efforts Trach obstruction? Will bronch  Roselie Awkward, MD [facility] Pager: [phone] Cell: [phone] If no response, call [phone]

## 2014-09-30 NOTE — Procedures (Signed)
PCCM Bronchoscopy Procedure Note  The patient was informed of the risks (including but not limited to bleeding, infection, respiratory failure, lung injury, tooth/oral injury) and benefits of the procedure and gave consent, see chart.  Indication: tracheostomy obstruction?  Location: [facility]  Condition pre procedure: on mechanical ventilator  Medications for procedure: none  Procedure description: The bronchoscope was introduced through the tracheostomy and passed to the trachea.  There was some granulation tissue noted at the distal orifice of the tracheostomy, but it was not obstructing the airway.  The trachea was visualized as was the carina.  There were moderate secretions in the airway which were suctioned.  Procedures performed: none  Specimens sent: none  Condition post procedure: stable on ventilator  EBL: none  Complications: none  Post procedure diagnosis: small amount of granulation tissue at distal orifice of the tracheostomy, likely related to hold trach, tracheostomy is in good position.  Recommend using a smaller ballard suction catheter for pulmonary toilette.  Roselie Awkward, MD [place] [facility] Pager: [phone] Cell: [phone] If no response, call [phone]

## 2014-09-30 NOTE — Progress Notes (Addendum)
Subjective: Appears alert  Exam: Filed Vitals:   09/30/14 0742  BP:   Pulse: 89  Temp:   Resp: 31   Gen: In bed, NAD MS: awake, alert, interactive YQ:IHKVQ, blinks to threat Motor: shrugs  Shoulders well has 1/5 shoulder abduction, able to wiggle toes bilaterally.  Sensory: grimaces to noxious stimuli bilateral arms and legs DTR: 3+ with spread and crossed adductors.    Impression: 59 yo F with AMS following cardiac arrest. She has markedly improved in mental status, but remains quadriplegic. She endorses sensation in arms. Her hyperreflexia would argue for an upper motor neuron cause.  I would be concerned for spinal injury during arrest(hypotensive).  Also possible would be CPM, though this was not apparent on the initial MRI brain.   I do think repeat imaging at this point could be useful for either of these issues.   The fact that she has some return of motor function is encouraging.   Recommendations: 1) Agree with continued supportive care.  2) Repeat MRI brain and c-spine, will get thin cut flair and T2 through pons.    Roland Rack, MD [facility] [phone]  If 7pm- 7am, please page neurology on call as listed in [place].

## 2014-10-01 ENCOUNTER — Inpatient Hospital Stay (HOSPITAL_COMMUNITY): Payer: Commercial Managed Care - HMO

## 2014-10-01 DIAGNOSIS — L03113 Cellulitis of right upper limb: Secondary | ICD-10-CM

## 2014-10-01 DIAGNOSIS — M869 Osteomyelitis, unspecified: Secondary | ICD-10-CM | POA: Insufficient documentation

## 2014-10-01 DIAGNOSIS — G8250 Quadriplegia, unspecified: Secondary | ICD-10-CM | POA: Insufficient documentation

## 2014-10-01 DIAGNOSIS — J189 Pneumonia, unspecified organism: Secondary | ICD-10-CM | POA: Insufficient documentation

## 2014-10-01 LAB — CBC WITH DIFFERENTIAL/PLATELET
BASOS ABS: 0 10*3/uL (ref 0.0–0.1)
Basophils Relative: 0 % (ref 0–1)
EOS ABS: 0.1 10*3/uL (ref 0.0–0.7)
Eosinophils Relative: 1 % (ref 0–5)
HCT: 22 % — ABNORMAL LOW (ref 36.0–46.0)
Hemoglobin: 7.2 g/dL — ABNORMAL LOW (ref 12.0–15.0)
LYMPHS ABS: 0.9 10*3/uL (ref 0.7–4.0)
Lymphocytes Relative: 10 % — ABNORMAL LOW (ref 12–46)
MCH: 28.5 pg (ref 26.0–34.0)
MCHC: 32.7 g/dL (ref 30.0–36.0)
MCV: 87 fL (ref 78.0–100.0)
Monocytes Absolute: 0.6 10*3/uL (ref 0.1–1.0)
Monocytes Relative: 7 % (ref 3–12)
Neutro Abs: 7.4 10*3/uL (ref 1.7–7.7)
Neutrophils Relative %: 82 % — ABNORMAL HIGH (ref 43–77)
PLATELETS: 238 10*3/uL (ref 150–400)
RBC: 2.53 MIL/uL — ABNORMAL LOW (ref 3.87–5.11)
RDW: 22.1 % — AB (ref 11.5–15.5)
WBC: 9 10*3/uL (ref 4.0–10.5)

## 2014-10-01 LAB — PROTIME-INR
INR: 1.02 (ref 0.00–1.49)
Prothrombin Time: 13.5 seconds (ref 11.6–15.2)

## 2014-10-01 LAB — BASIC METABOLIC PANEL
ANION GAP: 8 (ref 5–15)
BUN: 55 mg/dL — ABNORMAL HIGH (ref 6–23)
CO2: 23 mmol/L (ref 19–32)
Calcium: 8.6 mg/dL (ref 8.4–10.5)
Chloride: 107 mmol/L (ref 96–112)
Creatinine, Ser: 1.17 mg/dL — ABNORMAL HIGH (ref 0.50–1.10)
GFR, EST AFRICAN AMERICAN: 58 mL/min — AB (ref >90)
GFR, EST NON AFRICAN AMERICAN: 50 mL/min — AB (ref >90)
Glucose, Bld: 91 mg/dL (ref 70–99)
Potassium: 3.6 mmol/L (ref 3.5–5.1)
Sodium: 138 mmol/L (ref 135–145)

## 2014-10-01 LAB — BRAIN NATRIURETIC PEPTIDE: B NATRIURETIC PEPTIDE 5: 222.6 pg/mL — AB (ref 0.0–100.0)

## 2014-10-01 LAB — GLUCOSE, CAPILLARY
GLUCOSE-CAPILLARY: 114 mg/dL — AB (ref 70–99)
GLUCOSE-CAPILLARY: 115 mg/dL — AB (ref 70–99)
GLUCOSE-CAPILLARY: 128 mg/dL — AB (ref 70–99)
GLUCOSE-CAPILLARY: 92 mg/dL (ref 70–99)
Glucose-Capillary: 142 mg/dL — ABNORMAL HIGH (ref 70–99)
Glucose-Capillary: 79 mg/dL (ref 70–99)
Glucose-Capillary: 100 mg/dL — ABNORMAL HIGH (ref 70–99)

## 2014-10-01 LAB — MAGNESIUM: Magnesium: 2.1 mg/dL (ref 1.5–2.5)

## 2014-10-01 MED ORDER — MIDAZOLAM HCL 2 MG/2ML IJ SOLN
INTRAMUSCULAR | Status: AC
  Administered 2014-10-01: 15:00:00
  Filled 2014-10-01: qty 2

## 2014-10-01 MED ORDER — CEFAZOLIN SODIUM-DEXTROSE 2-3 GM-% IV SOLR
INTRAVENOUS | Status: AC
  Administered 2014-10-01: 15:00:00
  Filled 2014-10-01: qty 50

## 2014-10-01 MED ORDER — FUROSEMIDE 10 MG/ML IJ SOLN
40.0000 mg | Freq: Two times a day (BID) | INTRAMUSCULAR | Status: AC
  Administered 2014-10-01 – 2014-10-02 (×2): 40 mg via INTRAVENOUS
  Filled 2014-10-01 (×2): qty 4

## 2014-10-01 MED ORDER — POTASSIUM CHLORIDE 20 MEQ/15ML (10%) PO SOLN
40.0000 meq | Freq: Two times a day (BID) | ORAL | Status: AC
  Administered 2014-10-01 – 2014-10-02 (×2): 40 meq
  Filled 2014-10-01 (×2): qty 30

## 2014-10-01 MED ORDER — FENTANYL CITRATE 0.05 MG/ML IJ SOLN
INTRAMUSCULAR | Status: AC
  Administered 2014-10-01: 15:00:00
  Filled 2014-10-01: qty 2

## 2014-10-01 MED ORDER — FENTANYL CITRATE 0.05 MG/ML IJ SOLN
INTRAMUSCULAR | Status: AC | PRN
  Administered 2014-10-01 (×2): 12.5 ug via INTRAVENOUS

## 2014-10-01 MED ORDER — MIDAZOLAM HCL 2 MG/2ML IJ SOLN
INTRAMUSCULAR | Status: AC | PRN
  Administered 2014-10-01 (×2): 0.5 mg via INTRAVENOUS

## 2014-10-01 MED ORDER — LIDOCAINE HCL 1 % IJ SOLN
INTRAMUSCULAR | Status: AC
  Filled 2014-10-01: qty 20

## 2014-10-01 MED ORDER — GLUCAGON HCL RDNA (DIAGNOSTIC) 1 MG IJ SOLR
INTRAMUSCULAR | Status: AC
  Filled 2014-10-01: qty 1

## 2014-10-01 MED ORDER — IOHEXOL 300 MG/ML  SOLN
50.0000 mL | Freq: Once | INTRAMUSCULAR | Status: AC | PRN
  Administered 2014-10-01: 15 mL

## 2014-10-01 NOTE — Procedures (Signed)
20 Fr. Pull through No comp

## 2014-10-01 NOTE — Progress Notes (Signed)
NEURO HOSPITALIST PROGRESS NOTE   SUBJECTIVE:                                                                                                                        Patient is s/p trach, on the vent, able to follow commands. Repeat MRI brain showed no acute abnormality. MRI cervical spine unrevealing. She is now able to have legs movements but still significant ly weak in the upper extremities.   OBJECTIVE:                                                                                                                           Vital signs in last 24 hours: Temp:  [98.7 F (37.1 C)-99.4 F (37.4 C)] 99.4 F (37.4 C) (03/07 0800) Pulse Rate:  [71-91] 77 (03/07 1103) Resp:  [19-34] 28 (03/07 1103) BP: (102-147)/(43-71) 126/43 mmHg (03/07 1103) SpO2:  [99 %-100 %] 100 % (03/07 1103) FiO2 (%):  [40 %] 40 % (03/07 1103) Weight:  [107.2 kg (236 lb 5.3 oz)] 107.2 kg (236 lb 5.3 oz) (03/07 0500)  Intake/Output from previous day: 03/06 0701 - 03/07 0700 In: 1520 [I.V.:200; NG/GT:1120; IV Piggyback:200] Out: 3850 [Urine:3850] Intake/Output this shift: Total I/O In: 30 [I.V.:30] Out: 475 [Urine:475] Nutritional status: Diet NPO time specified Except for: Sips with Meds  Past Medical History  Diagnosis Date  . COPD (chronic obstructive pulmonary disease)   . GERD (gastroesophageal reflux disease)   . Asthma   . HTN (hypertension)   . Depression   . Anxiety    Physical exam: no apparent distress. Head: normocephalic. Neck: supple, no bruits, no JVD. Cardiac: no murmurs. Lungs: clear. Abdomen: soft, no tender, no mass. Extremities: edema upper and lower extremities. Skin: no rash  Neurologic Exam:  Mental status: open eyes to verbal commands and is able to follow commands. CN 2-12: pupils 4 mm bilaterally reactive. EOM full without nystagmus. Face symmetric. Tongue midline. Motor: very weak grip, can not move upper ext proximally. Moving  lower on command. Sensory" light touch intact. DTR's: 2+ at the patella but trace at the biceps bilatterally. Plantars: downgoing. Coordination and gait: unable to test  Lab Results: No results found for: CHOL Lipid Panel No results for input(s):  CHOL, TRIG, HDL, CHOLHDL, VLDL, LDLCALC in the last 72 hours.  Studies/Results: Mr Herby Abraham Contrast  09/30/2014   ADDENDUM REPORT: 09/30/2014 14:27  ADDENDUM: The patient was on a ventilator and receiving supplementary oxygen during the MRI. Therefore, the abnormal FLAIR hyperintensity may be artifactual, and this is actually the favored explanation for the described appearance.  These results were discussed in person on 09/30/2014 at 2:20 pm with Dr. Roland Rack .   Electronically Signed   By: Logan Bores   On: 09/30/2014 14:27   09/30/2014   CLINICAL DATA:  Quadriplegia following cardiac arrest.  EXAM: MRI HEAD WITHOUT CONTRAST  TECHNIQUE: Multiplanar, multiecho pulse sequences of the brain and surrounding structures were obtained without intravenous contrast.  COMPARISON:  09/27/2014  FINDINGS: There is no evidence of acute infarct, intracranial hemorrhage, mass, midline shift, or extra-axial fluid collection. Ventricles and sulci are within normal limits for age. There is mild cortical FLAIR hyperintensity throughout both cerebral hemispheres, most conspicuous in the parietal regions on sagittal 3D FLAIR images but present relatively diffusely. No significant gyral swelling or sulcal effacement is identified. No significant white matter disease is identified, and no abnormality is seen in the brainstem or cerebellum.  Orbits are unremarkable. Paranasal sinuses are clear. There small bilateral mastoid effusions. Major intracranial vascular flow voids are preserved.  IMPRESSION: 1. Diffuse cortical T2 hyperintensity throughout both cerebral hemispheres, most suggestive of mild hypoxic injury secondary to recent cardiac arrest. No gyral swelling or mass  effect. 2. Normal appearance of the cerebellum and brainstem. 3. No acute infarct.  Electronically Signed: By: Logan Bores On: 09/30/2014 13:53   Mr Cervical Spine Wo Contrast  09/30/2014   CLINICAL DATA:  Quadriplegia following cardiac arrest.  EXAM: MRI CERVICAL SPINE WITHOUT CONTRAST  TECHNIQUE: Multiplanar, multisequence MR imaging of the cervical spine was performed. No intravenous contrast was administered.  COMPARISON:  09/28/2014  FINDINGS: Images are moderately to severely degraded by motion artifact.  Vertebral alignment is unchanged without listhesis. Vertebral body heights are preserved. Intervertebral disc space heights are relatively well preserved. Edema is again noted in the paraspinal musculature/soft tissues and interspinous soft tissues, grossly similar to the prior study.  The cervical spinal cord is normal in caliber. No gross spinal cord signal abnormality is identified, however evaluation is severely limited by motion. Mild multilevel disc degeneration is similar to the prior study, with detailed evaluation precluded by the degree of motion artifact. Spinal stenosis again appears greatest at C6-7, likely moderate. Tracheostomy and enteric tubes are noted.  IMPRESSION: 1. Moderately to severely motion degraded examination. No gross cervical spinal cord abnormality identified. 2. Grossly unchanged spondylosis. 3. Similar appearance of nonspecific posterior paraspinal soft tissue edema. Soft tissue/ligamentous injury not excluded.   Electronically Signed   By: Logan Bores   On: 09/30/2014 14:23   Dg Chest Port 1 View  10/01/2014   CLINICAL DATA:  Pneumonia.  EXAM: PORTABLE CHEST - 1 VIEW  COMPARISON:  09/30/2014.  FINDINGS: Tracheostomy tube, feeding tube, left IJ line in stable position. Mild bibasilar and right upper lobe infiltrates again noted. Small left pleural effusion cannot be excluded. No pleural effusion or pneumothorax. Heart size stable.  IMPRESSION: 1. Lines and tubes in  stable position. 2. Mild bibasilar and right upper lobe infiltrates again noted. No significant change. Small left pleural effusion cannot be excluded.   Electronically Signed   By: Marcello Moores  Register   On: 10/01/2014 07:32   Dg Chest Port 1 View  09/30/2014  CLINICAL DATA:  Pneumonia. Acute respiratory failure with hypoxia. Shock.  EXAM: PORTABLE CHEST - 1 VIEW  COMPARISON:  09/28/2014  FINDINGS: Support lines and tubes in appropriate position. No pneumothorax identified. Low lung volumes again noted. Heart size is stable. Diffuse interstitial prominence brain suspicious for interstitial edema. Mild increase in patchy airspace disease in central right upper lobe is suspicious for superimposed pneumonia.  IMPRESSION: Stable cardiomegaly and diffuse interstitial prominence suspicious for edema. Increased right upper lobe airspace disease suspicious for pneumonia.   Electronically Signed   By: Earle Gell M.D.   On: 09/30/2014 07:51    MEDICATIONS                                                                                                                        Scheduled: . antiseptic oral rinse  7 mL Mouth Rinse QID  . cefTAZidime (FORTAZ)  IV  2 g Intravenous 3 times per day  . chlorhexidine  15 mL Mouth Rinse BID  . free water  250 mL Per Tube 3 times per day  . heparin subcutaneous  5,000 Units Subcutaneous 3 times per day  . insulin aspart  0-20 Units Subcutaneous 6 times per day  . ipratropium-albuterol  3 mL Nebulization Q6H  . labetalol  200 mg Per Tube BID  . pantoprazole sodium  40 mg Per Tube Daily    ASSESSMENT/PLAN:                                                                                                           59 yo F with AMS following cardiac arrest. She has markedly improved in mental status, with improved movements lower extremities but still minimal movements arms. Has preserved DTR's and unrevealing MRI brain and cervical spine and some improvement noted on exam  today. Will continue to follow.   Dorian Pod, MD [facility] [phone]  10/01/2014, 11:41 AM

## 2014-10-01 NOTE — Progress Notes (Signed)
UR Completed.  [phone]  

## 2014-10-01 NOTE — Progress Notes (Signed)
PULMONARY / CRITICAL CARE MEDICINE   Name: Jamie Fields MRN: [id number] DOB: May 03, 1956    ADMISSION DATE:  09/17/2014 CONSULTATION DATE:  09/17/2014   REFERRING MD :  [facility]  CHIEF COMPLAINT:  Respiratory Distress  INITIAL PRESENTATION:  59 y.o. F who was taken to [facility] ED early AM hours 2/22 for respiratory distress.  She was admitted and placed on BiPAP.  Was also found to have acute renal failure, AG metabolic acidosis, Overnight, her acidosis and respiratory status worsened to the point she required intubation.  She was also placed on levophed for persistent shock.  Later in day 2/22 was transferred to [facility] for further management.  STUDIES:  CT Chest 2/22 >> no acute process CT abd / pelv 2/22 >> iliac lymph nodes likely reactive.  No acute process CXR 2/23  >> Persistent mild vascular congestion and mild interstitial edema bilaterally. Superimposed infiltrate right upper lobe and left base cannot be excluded. Echo 5/09 >> Systolic function was normal. EF 60% to 65%. Right Tib/Fib DG >> No evidence of osteo; Mild soft tissue swelling RUQ [place] 2/23 >> Liver echogenicity is diffusely increased and somewhat heterogeneous. Most likely are indicative of hepatic steatosis CT tib/fib Rt >> Negative for osteomyelitis or necrotizing fasciitis. Superficial edema and blistering CXR 2/28 >> No change in bilateral airspace disease opacities representing multifocal pneumonia versus asymmetric edema 3/1 ct head>>>neg 3/1 eeg>>slow, enceph 3/2 MRI brain>>>neg acute, sulci flare, may be related to high fio2 3/3 awake, but not moving extremeties 3/3 mri neck - neg   SIGNIFICANT EVENTS: 2/22  Admitted at [facility]; respiratory failure-intubated; shock; transferred to [facility] 2/23  On [place]; intubated; Fever, CXR infiltrate = sepsis 2/24  Group A strep, added Clinda; CT neg for nec fas; Tube feeds started 2/26  Hypoxia, brady arrest/ PEA in pm  2/27  CVVHD initiated  2/28  Pt net neg  6L; New CXR infiltrate - Vanc restarted 2/29- cvvhd off, urine output increased 3/4 Trach  3/5 -1 u PRBC   SUBJECTIVE:  No distress   VITAL SIGNS: Temp:  [98.7 F (37.1 C)-99.4 F (37.4 C)] 99.4 F (37.4 C) (03/07 0800) Pulse Rate:  [71-91] 81 (03/07 1200) Resp:  [19-34] 29 (03/07 1200) BP: (102-147)/(43-71) 122/58 mmHg (03/07 1200) SpO2:  [99 %-100 %] 100 % (03/07 1200) FiO2 (%):  [40 %] 40 % (03/07 1103) Weight:  [107.2 kg (236 lb 5.3 oz)] 107.2 kg (236 lb 5.3 oz) (03/07 0500)   HEMODYNAMICS:     VENTILATOR SETTINGS: Vent Mode:  [-] PRVC FiO2 (%):  [40 %] 40 % Set Rate:  [18 bmp] 18 bmp Vt Set:  [490 mL] 490 mL PEEP:  [5 cmH20] 5 cmH20 Pressure Support:  [10 cmH20] 10 cmH20 Plateau Pressure:  [22 cmH20-23 cmH20] 22 cmH20   INTAKE / OUTPUT:  Intake/Output Summary (Last 24 hours) at 10/01/14 1257 Last data filed at 10/01/14 1200  Gross per 24 hour  Intake   1020 ml  Output   3925 ml  Net  -2905 ml   PHYSICAL EXAMINATION: General: Obese female, awake Neuro: not moving arms legs, awake, follows commands, shrug shoulder/wiggle toes HEENT: trach un-remarkable [place]/AT. PERRL 3 mm Cardiovascular: RRR s1 s 2  Lungs: coarse Abdomen: Obese, BS x 4, soft, NT/ND.  Musculoskeletal: edema 1 plus Skin: RLE dressing c/d/i  LABS:  CBC  Recent Labs Lab 09/29/14 0451 09/30/14 0506 10/01/14 0500  WBC 10.5 9.6 9.0  HGB 6.9* 7.4* 7.2*  HCT 21.7*  22.4* 22.0*  PLT 154 188 238   Coag's  Recent Labs Lab 10/01/14 0500  INR 1.02   BMET  Recent Labs Lab 09/29/14 0451 09/30/14 0506 10/01/14 0500  NA 137 134* 138  K 3.8 3.9 3.6  CL 109 104 107  CO2 24 24 23   BUN 54* 51* 55*  CREATININE 1.22* 1.14* 1.17*  GLUCOSE 174* 147* 91   Electrolytes  Recent Labs Lab 09/27/14 0450  09/28/14 0500 09/29/14 0451 09/30/14 0506 10/01/14 0500  CALCIUM 8.0*  < > 8.2* 8.3* 8.4 8.6  MG 2.1  --  1.8 1.6 1.6 2.1  PHOS 7.2*  --  6.1* 4.6  --   --   < > = values in this  interval not displayed.   Sepsis Markers No results for input(s): LATICACIDVEN, PROCALCITON, O2SATVEN in the last 168 hours.   ABG  Recent Labs Lab 09/25/14 0335  PHART 7.401  PCO2ART 43.5  PO2ART 97.2   Liver Enzymes  Recent Labs Lab 09/25/14 1134 09/26/14 0459  09/27/14 0450 09/28/14 0500 09/29/14 0451  AST 87* 100*  --  61*  --   --   ALT 145* 139*  --  102*  --   --   ALKPHOS 414* 472*  --  304*  --   --   BILITOT 2.3* 1.8*  --  1.4*  --   --   ALBUMIN 1.8* 1.8*  < > 1.7* 1.7* 1.6*  < > = values in this interval not displayed. Cardiac Enzymes No results for input(s): TROPONINI, PROBNP in the last 168 hours. Glucose  Recent Labs Lab 09/30/14 1507 09/30/14 2012 10/01/14 0012 10/01/14 0431 10/01/14 0747 10/01/14 1125  GLUCAP 106* 142* 128* 79 100* 92    Imaging Mr Brain Wo Contrast  09/30/2014   ADDENDUM REPORT: 09/30/2014 14:27  ADDENDUM: The patient was on a ventilator and receiving supplementary oxygen during the MRI. Therefore, the abnormal FLAIR hyperintensity may be artifactual, and this is actually the favored explanation for the described appearance.  These results were discussed in person on 09/30/2014 at 2:20 pm with Dr. Roland Rack .   Electronically Signed   By: Logan Bores   On: 09/30/2014 14:27   09/30/2014   CLINICAL DATA:  Quadriplegia following cardiac arrest.  EXAM: MRI HEAD WITHOUT CONTRAST  TECHNIQUE: Multiplanar, multiecho pulse sequences of the brain and surrounding structures were obtained without intravenous contrast.  COMPARISON:  09/27/2014  FINDINGS: There is no evidence of acute infarct, intracranial hemorrhage, mass, midline shift, or extra-axial fluid collection. Ventricles and sulci are within normal limits for age. There is mild cortical FLAIR hyperintensity throughout both cerebral hemispheres, most conspicuous in the parietal regions on sagittal 3D FLAIR images but present relatively diffusely. No significant gyral swelling or  sulcal effacement is identified. No significant white matter disease is identified, and no abnormality is seen in the brainstem or cerebellum.  Orbits are unremarkable. Paranasal sinuses are clear. There small bilateral mastoid effusions. Major intracranial vascular flow voids are preserved.  IMPRESSION: 1. Diffuse cortical T2 hyperintensity throughout both cerebral hemispheres, most suggestive of mild hypoxic injury secondary to recent cardiac arrest. No gyral swelling or mass effect. 2. Normal appearance of the cerebellum and brainstem. 3. No acute infarct.  Electronically Signed: By: Logan Bores On: 09/30/2014 13:53   Mr Cervical Spine Wo Contrast  09/30/2014   CLINICAL DATA:  Quadriplegia following cardiac arrest.  EXAM: MRI CERVICAL SPINE WITHOUT CONTRAST  TECHNIQUE: Multiplanar, multisequence MR imaging of the  cervical spine was performed. No intravenous contrast was administered.  COMPARISON:  09/28/2014  FINDINGS: Images are moderately to severely degraded by motion artifact.  Vertebral alignment is unchanged without listhesis. Vertebral body heights are preserved. Intervertebral disc space heights are relatively well preserved. Edema is again noted in the paraspinal musculature/soft tissues and interspinous soft tissues, grossly similar to the prior study.  The cervical spinal cord is normal in caliber. No gross spinal cord signal abnormality is identified, however evaluation is severely limited by motion. Mild multilevel disc degeneration is similar to the prior study, with detailed evaluation precluded by the degree of motion artifact. Spinal stenosis again appears greatest at C6-7, likely moderate. Tracheostomy and enteric tubes are noted.  IMPRESSION: 1. Moderately to severely motion degraded examination. No gross cervical spinal cord abnormality identified. 2. Grossly unchanged spondylosis. 3. Similar appearance of nonspecific posterior paraspinal soft tissue edema. Soft tissue/ligamentous injury  not excluded.   Electronically Signed   By: Logan Bores   On: 09/30/2014 14:23   Dg Chest Port 1 View  09/30/2014   CLINICAL DATA:  Pneumonia. Acute respiratory failure with hypoxia. Shock.  EXAM: PORTABLE CHEST - 1 VIEW  COMPARISON:  09/28/2014  FINDINGS: Support lines and tubes in appropriate position. No pneumothorax identified. Low lung volumes again noted. Heart size is stable. Diffuse interstitial prominence brain suspicious for interstitial edema. Mild increase in patchy airspace disease in central right upper lobe is suspicious for superimposed pneumonia.  IMPRESSION: Stable cardiomegaly and diffuse interstitial prominence suspicious for edema. Increased right upper lobe airspace disease suspicious for pneumonia.   Electronically Signed   By: Earle Gell M.D.   On: 09/30/2014 07:51   ASSESSMENT / PLAN:  PULMONARY OETT 2/22 >>> A: Acute hypoxic respiratory failure s/p tracho 3/4  COPD without evidence of exacerbation ARDS  P:   Trach collar as tolerated F/u CXR intermittently  CARDIOVASCULAR CVL R fem 2/22 >> 2/23 CVL L IJ 2/23 >> A:  Shock - Septic +/-  Hypovolemic>improved remains off pressors 3/5  Hx HTN, active  Adrenal Insufficiency - unfortunately received steroids before level, so empirically treated P:  Continue labetalol, lasix  RENAL A:   Mixed metabolic acidosis - due to lactate & renal failure -->resolved Acute renal failure -residual ATN diuresis Rhabdo - improving with IVF resuscitation>ck return nml  Pseudohypocalcemia - corrects to wnl Hypernatremia -Resolved  P:   Continue maint Free water  Chem in am    GASTROINTESTINAL A:  Transaminitis - Improving GERD Obesity  Severe protein calorie malnutrition  diarhea -C diff neg x 1  P:   Pantoprazole Tube feeds tolerated Imodium  HEMATOLOGIC A:   Anemia -chronic dz -s/p PRBC x 1 on 3/3 , 3/5  VTE prophylaxis Thrombocytopenia - improving  P:  Cbc in am  sub q hep  INFECTIOUS A:   Group A  Strep Cellulitis/PNA with Septic Shock P:   BCx2 2/22 >>NG Resp sputum >>> RARE GROUP A STREP (S.PYOGENES) ISOLATED Sputum 2/28- NF BC 3/2>>>neg   Levofloxacin 2/23>>>2/24 Clinda 2/24 >> 2/26 Vanc, start date 2/22>>>2/26 ---- Restarted 2/28 >>2/29 Zosyn, start date 2/22>>>2/26 Unasyn 2/26 >>3/2 IVIG 2/24 >> 2/25 ceftaz 3/2>>>>3/7 Will d/c abx 3/07. All current cultures negative.     ENDOCRINE A:   R/o rel AI - cortisol 58 but had received dose IV  R/o sick euthryoid - TSH low: 0.258,  T3 Low (37); free T4 low (0.74)  P:   SSI while on tube feeds  NEUROLOGIC A:  Acute metabolic encephalopathy-->improving  Hx Depression, Anxiety, Pain Functional quadriplegia s/p critical illness... Currently working dx is critical care polyneuropathy MRI neck neg, quadraplegia unclear etiology  P:   RASS goal: 0 F/u with neuro   59 year old female trach dependent after group A cellulitis and strep PNA. She is doing well off vent. No longer in shock and has completed antibiotics. Her biggest issues seem to be profound deconditioning, weakness, and volume overload. IT is unclear at this point but the working dx re: her weakness is critical care polyneuropathy. She is scheduled for PEG. She meets criteria for LTAC. Think her recovery will take weeks.   Erick Colace ACNP-BC [place] Pager # [phone] OR # [phone] if no answer    Attending: Reviewed above, examined.  She is tolerating time off vent, and mental status improving.  Has remained some movement in lower extremities.  Will d/c Abx.  She should be ready to progress after PEG placement.  Chesley Mires, MD [facility] Pulmonary/Critical Care 10/01/2014, 2:39 PM Pager:  [phone] After 3pm call: [phone]

## 2014-10-01 NOTE — Sedation Documentation (Signed)
Successful Gtube placed 

## 2014-10-01 NOTE — Progress Notes (Signed)
Pt returned from IR for placement of G-tube. Pt alert, and calm. Vitals stable. Nursing to continue to monitor pt.

## 2014-10-01 NOTE — Progress Notes (Signed)
Pt noted to have copious bloody secretions from trach during suctioning and while coughing. Pt has coughed up 2 small blood clots. Pt not experiencing respiratory distress and sats are WNL. Nursing to continue to monitor pt. Dr. Halford Chessman made aware. Requests for nursing to monitor for now.

## 2014-10-01 NOTE — Sedation Documentation (Signed)
Ancef 2gm IV infusing per Dr. Barbie Banner

## 2014-10-02 DIAGNOSIS — R1310 Dysphagia, unspecified: Secondary | ICD-10-CM | POA: Insufficient documentation

## 2014-10-02 LAB — GLUCOSE, CAPILLARY
GLUCOSE-CAPILLARY: 133 mg/dL — AB (ref 70–99)
GLUCOSE-CAPILLARY: 124 mg/dL — AB (ref 70–99)
GLUCOSE-CAPILLARY: 145 mg/dL — AB (ref 70–99)
Glucose-Capillary: 131 mg/dL — ABNORMAL HIGH (ref 70–99)
Glucose-Capillary: 140 mg/dL — ABNORMAL HIGH (ref 70–99)
Glucose-Capillary: 141 mg/dL — ABNORMAL HIGH (ref 70–99)

## 2014-10-02 LAB — CULTURE, BLOOD (ROUTINE X 2)
CULTURE: NO GROWTH
CULTURE: NO GROWTH

## 2014-10-02 LAB — BASIC METABOLIC PANEL
ANION GAP: 9 (ref 5–15)
BUN: 57 mg/dL — AB (ref 6–23)
CHLORIDE: 105 mmol/L (ref 96–112)
CO2: 28 mmol/L (ref 19–32)
Calcium: 9 mg/dL (ref 8.4–10.5)
Creatinine, Ser: 1.09 mg/dL (ref 0.50–1.10)
GFR calc Af Amer: 63 mL/min — ABNORMAL LOW (ref >90)
GFR calc non Af Amer: 54 mL/min — ABNORMAL LOW (ref >90)
Glucose, Bld: 144 mg/dL — ABNORMAL HIGH (ref 70–99)
Potassium: 3.8 mmol/L (ref 3.5–5.1)
Sodium: 142 mmol/L (ref 135–145)

## 2014-10-02 LAB — MAGNESIUM: Magnesium: 1.9 mg/dL (ref 1.5–2.5)

## 2014-10-02 MED ORDER — VITAL AF 1.2 CAL PO LIQD
1000.0000 mL | ORAL | Status: DC
  Administered 2014-10-03 – 2014-10-11 (×10): 1000 mL
  Filled 2014-10-02 (×21): qty 1000

## 2014-10-02 MED ORDER — IPRATROPIUM-ALBUTEROL 0.5-2.5 (3) MG/3ML IN SOLN
3.0000 mL | RESPIRATORY_TRACT | Status: DC | PRN
  Filled 2014-10-02: qty 3

## 2014-10-02 MED ORDER — FUROSEMIDE 10 MG/ML IJ SOLN
40.0000 mg | Freq: Three times a day (TID) | INTRAMUSCULAR | Status: AC
  Administered 2014-10-02 (×2): 40 mg via INTRAVENOUS
  Filled 2014-10-02 (×2): qty 4

## 2014-10-02 NOTE — Progress Notes (Signed)
Patient noted with respiratory rate of 42 and legs keep moving out the bed. Elink notified> MD will be to assess.

## 2014-10-02 NOTE — Evaluation (Signed)
Occupational Therapy Evaluation Patient Details Name: Jamie Fields MRN: [id number] DOB: 06-06-1956 Today's Date: 10/02/2014    History of Present Illness 59 y.o. F who was taken to [facility] ED early AM hours 2/22 for respiratory distress intubated and transferred to [facility]. 2/26 bradycardia with PEA. Pt with renal failure, shock, encephalopathy, polyneuropathy, CVVHD 2/27-2/29, trach 3/4, gtube 3/7   Clinical Impression   Per chart and pt's report, she was independent prior to admission and did not ambulate with a device.  Pt has had a prolonged and complicated hospital course and is generally weak.  She is dependent in all ADL and mobility. No family available for information about home environment or assistance at home. Will follow acutely.  Pt will require post acute rehab.    Follow Up Recommendations  LTACH;Supervision/Assistance - 24 hour    Equipment Recommendations       Recommendations for Other Services       Precautions / Restrictions Precautions Precautions: Fall Precaution Comments: trach, gtube, incontinent BM Restrictions Weight Bearing Restrictions: No      Mobility Bed Mobility Overal bed mobility: Needs Assistance Bed Mobility: Rolling Rolling: Max assist           Transfers                      Balance                                            ADL                                         General ADL Comments: Dependent in all ADL at bed level. Pt is tube fed.     Vision Additional Comments: glasses are not in the hospital   Perception     Praxis      Pertinent Vitals/Pain Pain Assessment: Faces Faces Pain Scale: Hurts a little bit Pain Location: R hand with ROM, retrograde massage Pain Descriptors / Indicators: Grimacing     Hand Dominance Right   Extremity/Trunk Assessment Upper Extremity Assessment Upper Extremity Assessment: RUE deficits/detail;LUE deficits/detail RUE Deficits  / Details: 2/5 shoulder, 3/5 elbow to hand, edematous hand RUE Coordination: decreased fine motor;decreased gross motor LUE Deficits / Details: 2/5 shoulder, 3/5 elbow to hand, edematous hand          Communication Communication Communication: Tracheostomy   Cognition Arousal/Alertness: Awake/alert Behavior During Therapy: WFL for tasks assessed/performed Overall Cognitive Status: Difficult to assess Area of Impairment: Orientation;Following commands Orientation Level: Disoriented to;Time     Following Commands: Follows one step commands consistently       General Comments: pt without consistent responses with yes/no or head nods. She follow one step commands for mobility accurately   General Comments       Exercises       Shoulder Instructions      Home Living Family/patient expects to be discharged to:: Private residence Living Arrangements: Alone                 Bathroom Shower/Tub: Teacher, early years/pre: Standard     Home Equipment: Shower seat;Bedside commode   Additional Comments: Needs further assessment, pt with difficulty answering PLOF questions with trach, no family available.  Prior Functioning/Environment Level of Independence: Independent             OT Diagnosis: Generalized weakness;Cognitive deficits;Acute pain   OT Problem List: Decreased strength;Decreased activity tolerance;Impaired balance (sitting and/or standing);Decreased cognition;Decreased knowledge of use of DME or AE;Obesity;Impaired UE functional use;Pain;Increased edema   OT Treatment/Interventions: Self-care/ADL training;DME and/or AE instruction;Therapeutic exercise;Therapeutic activities;Cognitive remediation/compensation;Patient/family education;Balance training    OT Goals(Current goals can be found in the care plan section) Acute Rehab OT Goals Patient Stated Goal: return home OT Goal Formulation: With patient Time For Goal Achievement:  10/16/14 Potential to Achieve Goals: Good ADL Goals Pt Will Perform Grooming: with mod assist;sitting;bed level Pt/caregiver will Perform Home Exercise Program: Both right and left upper extremity;Increased strength (AAROM/AROM B UEs all areas x 1 each) Additional ADL Goal #1: Pt will perform bed mobility with +2 mod assist in preparation for ADL at EOB. Additional ADL Goal #2: Pt will sit EOB with min guard assist x 5 minutes in preparation for ADL at EOB. Additional ADL Goal #3: Pt will perform hand to mouth movements as a precursor to self feeding and grooming.  OT Frequency: Min 2X/week   Barriers to D/C:            Co-evaluation              End of Session    Activity Tolerance:   Patient left: in bed;with nursing/sitter in room;with call bell/phone within reach   Time: 1353-1408 OT Time Calculation (min): 15 min Charges:  OT General Charges $OT Visit: 1 Procedure OT Evaluation $Initial OT Evaluation Tier I: 1 Procedure G-Codes:    Malka So 10/02/2014, 4:21 PM  [phone]

## 2014-10-02 NOTE — Evaluation (Signed)
Passy-Muir Speaking Valve - Evaluation Patient Details  Name: Jamie Fields MRN: [id number] Date of Birth: [birth year]  Today's Date: 10/02/2014 Time: 2119-4174 SLP Time Calculation (min) (ACUTE ONLY): 20 min  Past Medical History:  Past Medical History  Diagnosis Date  . COPD (chronic obstructive pulmonary disease)   . GERD (gastroesophageal reflux disease)   . Asthma   . HTN (hypertension)   . Depression   . Anxiety    Past Surgical History:  Past Surgical History  Procedure Laterality Date  . Tracheostomy      feinstein   HPI:  59 y.o. F who was taken to [facility] ED early AM hours 2/22 for respiratory distress intubated and transferred to [facility]. 2/26 bradycardia with PEA. Pt with renal failure, shock, encephalopathy, polyneuropathy, CVVHD 2/27-2/29, ETT 2/22-3/04; trach 3/4; Gtube 3/7.     Assessment / Plan / Recommendation Clinical Impression  PMV evaluation completed.  Cuff deflated at baseline.  Tracheal suctioning per RN elicited negligible secretions.  PMV placed for brief intervals of 1-2 ventilatory cycles.  Pt unable to achieve voice nor exhale orally despite max cues and pt's obvious effort.  Removal of valve revealed likely air trapping; unable to access upper airway.  Pt mouthing words in effort to communicate - no functional phonation with valve in place.   Not ready for use of PMV; recommend continued trials of PMV with SLP only; will follow for readiness for swallow evaluation.  D/W RN.      SLP Assessment  Patient needs continued Speech Lanaguage Pathology Services    Follow Up Recommendations   (tba)    Frequency and Duration min 3x week  2 weeks       SLP Goals Potential to Achieve Goals (ACUTE ONLY): Good   PMSV Trial  PMSV was placed for: intermittently 1-2 ventilatory cycles Able to redirect subglottic air through upper airway: No Able to Attain Phonation: No Voice Quality: Aphonic Able to Expectorate Secretions: No attempts Level of  Secretion Expectoration with PMSV: Not observed (tracheal suctioning by RN elicited no secretions) Intelligibility: Unable to assess (comment) Respirations During Trial: 26 SpO2 During Trial: 100 % Pulse During Trial: 82 Behavior: Alert   Tracheostomy Tube    #6 Shiley with cuff deflated   Vent Dependency  FiO2 (%): 28 %    Cuff Deflation Trial Tolerated Cuff Deflation: Yes Length of Time for Cuff Deflation Trial:  (deflated at baseline) Behavior: Alert   Juan Quam Laurice 10/02/2014, 3:54 PM Chera Slivka L. Tivis Ringer, [place] CCC/SLP Pager [phone]

## 2014-10-02 NOTE — Progress Notes (Signed)
Subjective: Pt stable; s/p G tube 3/7; reports only mild pain at site  Objective: Vital signs in last 24 hours: Temp:  [98 F (36.7 C)-99.4 F (37.4 C)] 99.4 F (37.4 C) (03/08 1320) Pulse Rate:  [67-90] 82 (03/08 1500) Resp:  [18-34] 28 (03/08 1500) BP: (101-151)/(44-80) 138/45 mmHg (03/08 1500) SpO2:  [97 %-100 %] 100 % (03/08 1500) FiO2 (%):  [28 %-40 %] 28 % (03/08 1415) Weight:  [218 lb 7.6 oz (99.1 kg)] 218 lb 7.6 oz (99.1 kg) (03/08 0500) Last BM Date: 10/01/14  Intake/Output from previous day: 03/07 0701 - 03/08 0700 In: 1460.8 [I.V.:200; NG/GT:1200.8] Out: 6800 [Urine:6800] Intake/Output this shift: Total I/O In: 690 [I.V.:20; Other:40; NG/GT:630] Out: 2750 [Urine:2750]  G tube intact, insertion site ok, mildly tender, abd soft  Lab Results:   Recent Labs  09/30/14 0506 10/01/14 0500  WBC 9.6 9.0  HGB 7.4* 7.2*  HCT 22.4* 22.0*  PLT 188 238   BMET  Recent Labs  10/01/14 0500 10/02/14 0416  NA 138 142  K 3.6 3.8  CL 107 105  CO2 23 28  GLUCOSE 91 144*  BUN 55* 57*  CREATININE 1.17* 1.09  CALCIUM 8.6 9.0   PT/INR  Recent Labs  10/01/14 0500  LABPROT 13.5  INR 1.02   ABG No results for input(s): PHART, HCO3 in the last 72 hours.  Invalid input(s): PCO2, PO2  Studies/Results: Ir Gastrostomy Tube Mod Sed  10/01/2014   CLINICAL DATA:  Cardiac arrest  EXAM: PERCUTANEOUS GASTROSTOMY  FLUOROSCOPY TIME:  2 minutes and 42 seconds.  MEDICATIONS AND MEDICAL HISTORY: Versed 1 mg, Fentanyl 25 mcg.  ANESTHESIA/SEDATION: Moderate sedation time: 20 minutes  CONTRAST:  10 cc Omnipaque 300  PROCEDURE: The procedure, risks, benefits, and alternatives were explained to the patient. Questions regarding the procedure were encouraged and answered. The patient understands and consents to the procedure.  The epigastrium was prepped with Betadine in a sterile fashion, and a sterile drape was applied covering the operative field. A sterile gown and sterile gloves  were used for the procedure.  A 5-French orogastric tube is placed under fluoroscopic guidance. Scout imaging of the abdomen confirms barium within the transverse colon.  The stomach was distended with gas. Under fluoroscopic guidance, an 18 gauge needle was utilized to puncture the anterior wall of the body of the stomach. An Amplatz wire was advanced through the needle passing a T fastener into the lumen of the stomach. The T fastener was secured for gastropexy. A 9-French sheath was inserted.  A snare was advanced through the 9-French sheath. A Britta Mccreedy was advanced through the orogastric tube. It was snared then pulled out the oral cavity, pulling the snare, as well. The leading edge of the gastrostomy was attached to the snare. It was then pulled down the esophagus and out the percutaneous site. It was secured in place. Contrast was injected.  COMPLICATIONS: None  FINDINGS: The image demonstrates placement of a 20-French pull-through type gastrostomy tube into the body of the stomach.  IMPRESSION: Successful 20 French pull-through gastrostomy.   Electronically Signed   By: Marybelle Killings M.D.   On: 10/01/2014 16:29   Dg Chest Port 1 View  10/01/2014   CLINICAL DATA:  Pneumonia.  EXAM: PORTABLE CHEST - 1 VIEW  COMPARISON:  09/30/2014.  FINDINGS: Tracheostomy tube, feeding tube, left IJ line in stable position. Mild bibasilar and right upper lobe infiltrates again noted. Small left pleural effusion cannot be excluded. No pleural effusion or pneumothorax.  Heart size stable.  IMPRESSION: 1. Lines and tubes in stable position. 2. Mild bibasilar and right upper lobe infiltrates again noted. No significant change. Small left pleural effusion cannot be excluded.   Electronically Signed   By: Marcello Moores  Register   On: 10/01/2014 07:32    Anti-infectives: Anti-infectives    Start     Dose/Rate Route Frequency Ordered Stop   10/01/14 1418  ceFAZolin (ANCEF) 2-3 GM-% IVPB SOLR    Comments:  Laughlin, Amy   : cabinet  override      10/01/14 1418 10/01/14 1430   09/29/14 1400  cefTAZidime (FORTAZ) 2 g in dextrose 5 % 50 mL IVPB  Status:  Discontinued     2 g 100 mL/hr over 30 Minutes Intravenous 3 times per day 09/29/14 0940 10/01/14 1327   09/28/14 0900  cefTAZidime (FORTAZ) 2 g in dextrose 5 % 50 mL IVPB  Status:  Discontinued     2 g 100 mL/hr over 30 Minutes Intravenous Every 12 hours 09/28/14 0751 09/29/14 0940   09/27/14 1500  vancomycin (VANCOCIN) 1,250 mg in sodium chloride 0.9 % 250 mL IVPB  Status:  Discontinued     1,250 mg 166.7 mL/hr over 90 Minutes Intravenous Every 24 hours 09/27/14 1000 09/28/14 0920   09/26/14 1400  vancomycin (VANCOCIN) 2,000 mg in sodium chloride 0.9 % 500 mL IVPB     2,000 mg 250 mL/hr over 120 Minutes Intravenous  Once 09/26/14 1315 09/26/14 1700   09/26/14 1400  cefTAZidime (FORTAZ) 2 g in dextrose 5 % 50 mL IVPB  Status:  Discontinued     2 g 100 mL/hr over 30 Minutes Intravenous 3 times per day 09/26/14 1315 09/26/14 1316   09/26/14 1400  cefTAZidime (FORTAZ) 2 g in dextrose 5 % 50 mL IVPB  Status:  Discontinued     2 g 100 mL/hr over 30 Minutes Intravenous Every 24 hours 09/26/14 1316 09/28/14 0751   09/25/14 1000  ampicillin-sulbactam (UNASYN) 1.5 g in sodium chloride 0.9 % 50 mL IVPB  Status:  Discontinued     1.5 g 100 mL/hr over 30 Minutes Intravenous Every 12 hours 09/24/14 1052 09/26/14 1310   09/24/14 1800  ampicillin-sulbactam (UNASYN) 1.5 g in sodium chloride 0.9 % 50 mL IVPB  Status:  Discontinued     1.5 g 100 mL/hr over 30 Minutes Intravenous Every 12 hours 09/24/14 1035 09/24/14 1052   09/24/14 1400  Ampicillin-Sulbactam (UNASYN) 3 g in sodium chloride 0.9 % 100 mL IVPB     3 g 100 mL/hr over 60 Minutes Intravenous 3 times per day 09/24/14 1051 09/24/14 2255   09/24/14 1300  vancomycin (VANCOCIN) 1,250 mg in sodium chloride 0.9 % 250 mL IVPB  Status:  Discontinued     1,250 mg 166.7 mL/hr over 90 Minutes Intravenous Every 24 hours 09/23/14 1247  09/24/14 0851   09/23/14 1300  vancomycin (VANCOCIN) 1,750 mg in sodium chloride 0.9 % 500 mL IVPB     1,750 mg 250 mL/hr over 120 Minutes Intravenous NOW 09/23/14 1247 09/23/14 1500   09/22/14 1000  Ampicillin-Sulbactam (UNASYN) 3 g in sodium chloride 0.9 % 100 mL IVPB  Status:  Discontinued     3 g 100 mL/hr over 60 Minutes Intravenous 3 times per day 09/22/14 0850 09/24/14 1034   09/21/14 1300  clindamycin (CLEOCIN) IVPB 900 mg     900 mg 100 mL/hr over 30 Minutes Intravenous 3 times per day 09/21/14 0912 09/21/14 2309   09/21/14 1000  ampicillin-sulbactam (  UNASYN) 1.5 g in sodium chloride 0.9 % 50 mL IVPB  Status:  Discontinued     1.5 g 100 mL/hr over 30 Minutes Intravenous Every 12 hours 09/21/14 0914 09/22/14 0850   09/20/14 1600  vancomycin (VANCOCIN) 1,500 mg in sodium chloride 0.9 % 500 mL IVPB  Status:  Discontinued     1,500 mg 250 mL/hr over 120 Minutes Intravenous Every 48 hours 09/20/14 1455 09/21/14 0913   09/20/14 1400  piperacillin-tazobactam (ZOSYN) IVPB 3.375 g  Status:  Discontinued     3.375 g 12.5 mL/hr over 240 Minutes Intravenous 3 times per day 09/20/14 0744 09/21/14 0913   09/19/14 0945  clindamycin (CLEOCIN) IVPB 900 mg  Status:  Discontinued     900 mg 100 mL/hr over 30 Minutes Intravenous 3 times per day 09/19/14 0932 09/21/14 0912   09/19/14 0300  levofloxacin (LEVAQUIN) IVPB 750 mg  Status:  Discontinued     750 mg 100 mL/hr over 90 Minutes Intravenous Every 48 hours 09/18/14 0919 09/19/14 0936   09/18/14 2000  oseltamivir (TAMIFLU) 6 MG/ML suspension 30 mg  Status:  Discontinued     30 mg Oral Daily 09/18/14 1847 09/19/14 0936   09/18/14 1845  oseltamivir (TAMIFLU) capsule 30 mg  Status:  Discontinued     30 mg Oral Daily 09/18/14 1841 09/18/14 1846   09/18/14 1415  piperacillin-tazobactam (ZOSYN) IVPB 2.25 g  Status:  Discontinued     2.25 g 100 mL/hr over 30 Minutes Intravenous 4 times per day 09/18/14 1406 09/20/14 0744   09/17/14 2200   piperacillin-tazobactam (ZOSYN) IVPB 3.375 g  Status:  Discontinued     3.375 g 12.5 mL/hr over 240 Minutes Intravenous 3 times per day 09/17/14 2143 09/18/14 1406   09/17/14 2145  vancomycin (VANCOCIN) 2,000 mg in sodium chloride 0.9 % 500 mL IVPB     2,000 mg 250 mL/hr over 120 Minutes Intravenous  Once 09/17/14 2143 09/18/14 0200      Assessment/Plan: s/p perc G tube 3/7; ok to use tube for feeds; other plans as per CCM/NEURO  15 minutes spent with post G tube placement eval ALLRED,D [facility] 10/02/2014

## 2014-10-02 NOTE — Evaluation (Signed)
Physical Therapy Evaluation Patient Details Name: SUZZANE QUILTER MRN: [id number] DOB: Jan 03, 1956 Today's Date: 10/02/2014   History of Present Illness  59 y.o. F who was taken to [facility] ED early AM hours 2/22 for respiratory distress intubated and transferred to [facility]. 2/26 bradycardia with PEA. Pt with renal failure, shock, encephalopathy, polyneuropathy, CVVHD 2/27-2/29, trach 3/4, gtube 3/7  Clinical Impression  Pt with flat affect attempting to mouth words but unintelligible. Pt states she wants to get up and move per nursing. Pt with generalized weakness distal greater than proximal and bil UE worse than bil LE. Pt with AMS, polyneuropathy due to prolonged bedrest and hospital course. Pt will benefit from acute therapy to maximize mobility, strength, function and activity tolerance to decrease burden of care and return pt to PLOF. Will need to further assess and qualify PLOF once son returns to the [place] Pt educated for all extremity HEP and encouraged to continue to attempt to perform throughout the day. Requested PRAFOs for foot positioning given tight heel cord with little to no active dorsiflexion bil LE. Will follow.     Follow Up Recommendations LTACH;Supervision/Assistance - 24 hour    Equipment Recommendations  Other (comment) (will defer to [facility])    Recommendations for Other Services OT consult;Speech consult     Precautions / Restrictions Precautions Precautions: Fall Precaution Comments: trach, gtube, incontinent BM      Mobility  Bed Mobility Overal bed mobility: Needs Assistance Bed Mobility: Rolling Rolling: Max assist         General bed mobility comments: pt able to assist with bending bil LE for roll bil but unable to reach or grasp for rail with cues and assist. Max assist to rotate pelvis and trunk as well as to maintain sidelying for pericare as pt has a tendency to want to roll back to supine  Transfers                    Ambulation/Gait                Stairs            Wheelchair Mobility    Modified Rankin (Stroke Patients Only)       Balance                                             Pertinent Vitals/Pain Pain Assessment: No/denies pain  sats 100% on 28% trach collar HR 80    Home Living Family/patient expects to be discharged to:: Private residence Living Arrangements: Alone               Additional Comments: pt unable to provide PLOF even with yes/no questions. Per Rn son stated pt walked into the hospital and lived alone. Son is only family that has visited and is currently on a trip to [place] so unavailable for further information at this time    Prior Function Level of Independence: Independent               Hand Dominance        Extremity/Trunk Assessment   Upper Extremity Assessment: Generalized weakness;Defer to OT evaluation           Lower Extremity Assessment: RLE deficits/detail;LLE deficits/detail RLE Deficits / Details: hip flexion 2-/5, knee flexion 2-/5, dorsiflexion 1/5, hip abduct/ADD 3/5, knee extension 3/5, plantarflexion 3/5 LLE Deficits /  Details: hip flexion 2-/5, knee flexion 2-/5, dorsiflexion 0/5, hip abduct/ADD 3/5, knee extension 3/5, plantarflexion 3/5     Communication   Communication: Tracheostomy  Cognition Arousal/Alertness: Awake/alert Behavior During Therapy: Flat affect Overall Cognitive Status: Difficult to assess Area of Impairment: Orientation;Following commands Orientation Level: Disoriented to;Time     Following Commands: Follows one step commands consistently       General Comments: pt without consistent responses with yes/no or head nods. She follow one step commands for mobility accurately    General Comments      Exercises General Exercises - Upper Extremity Shoulder Flexion: AAROM;Both;10 reps;Supine Elbow Flexion: AAROM;Both;10 reps;Supine Elbow Extension: AAROM;Both;10 reps;Supine General  Exercises - Lower Extremity Ankle Circles/Pumps: AAROM;Both;10 reps Short Arc Quad: AROM;Both;10 reps;Seated Heel Slides: AAROM;Both;10 reps;Supine Hip ABduction/ADduction: AROM;Both;10 reps;Supine      Assessment/Plan    PT Assessment Patient needs continued PT services  PT Diagnosis Generalized weakness;Difficulty walking;Altered mental status   PT Problem List Decreased strength;Decreased cognition;Decreased activity tolerance;Decreased knowledge of use of DME;Decreased balance;Decreased safety awareness;Obesity;Decreased mobility;Cardiopulmonary status limiting activity  PT Treatment Interventions Functional mobility training;Therapeutic activities;Therapeutic exercise;Balance training;Patient/family education;Cognitive remediation;DME instruction   PT Goals (Current goals can be found in the Care Plan section) Acute Rehab PT Goals Patient Stated Goal: return home PT Goal Formulation: With patient Time For Goal Achievement: 10/02/14 Potential to Achieve Goals: Fair    Frequency Min 3X/week   Barriers to discharge Decreased caregiver support      Co-evaluation               End of Session   Activity Tolerance: Patient tolerated treatment well Patient left: in bed;with call bell/phone within reach (in full chair position) Nurse Communication: Mobility status;Need for lift equipment         Time: 1344-1410 PT Time Calculation (min) (ACUTE ONLY): 26 min   Charges:   PT Evaluation $Initial PT Evaluation Tier I: 1 Procedure PT Treatments $Therapeutic Exercise: 8-22 mins   PT G Codes:        Melford Aase 10/02/2014, 2:26 PM Elwyn Reach, [place]

## 2014-10-02 NOTE — Clinical Social Work Note (Signed)
Clinical Social Worker contacted pt's son, Ames Dura in reference to discharge planning for possible short-term rehab within a skilled nursing facility. CSW left a message for a returned phone call.   CSW will continue to follow.   Glendon Axe, MSW, LCSWA [phone] 10/02/2014 2:27 PM

## 2014-10-02 NOTE — Progress Notes (Signed)
PULMONARY / CRITICAL CARE MEDICINE   Name: ANH BIGOS MRN: [id number] DOB: 1955-11-29    ADMISSION DATE:  09/17/2014 CONSULTATION DATE:  09/17/2014   REFERRING MD :  [facility]  CHIEF COMPLAINT:  Respiratory Distress  INITIAL PRESENTATION:  59 y.o. F who was taken to [facility] ED early AM hours 2/22 for respiratory distress.  She was admitted and placed on BiPAP.  Was also found to have acute renal failure, AG metabolic acidosis, Overnight, her acidosis and respiratory status worsened to the point she required intubation.  She was also placed on levophed for persistent shock.  Later in day 2/22 was transferred to [facility] for further management.  STUDIES:  CT Chest 2/22 >> no acute process CT abd / pelv 2/22 >> iliac lymph nodes likely reactive.  No acute process CXR 2/23  >> Persistent mild vascular congestion and mild interstitial edema bilaterally. Superimposed infiltrate right upper lobe and left base cannot be excluded. Echo 8/52 >> Systolic function was normal. EF 60% to 65%. Right Tib/Fib DG >> No evidence of osteo; Mild soft tissue swelling RUQ [place] 2/23 >> Liver echogenicity is diffusely increased and somewhat heterogeneous. Most likely are indicative of hepatic steatosis CT tib/fib Rt >> Negative for osteomyelitis or necrotizing fasciitis. Superficial edema and blistering CXR 2/28 >> No change in bilateral airspace disease opacities representing multifocal pneumonia versus asymmetric edema 3/1 ct head>>>neg 3/1 eeg>>slow, enceph 3/2 MRI brain>>>neg acute, sulci flare, may be related to high fio2 3/3 awake, but not moving extremeties 3/3 mri neck - neg   SIGNIFICANT EVENTS: 2/22  Admitted at [place]; respiratory failure-intubated; shock; transferred to [facility] 2/23  On [place]; intubated; Fever, CXR infiltrate = sepsis 2/24  Group A strep, added [place]; CT neg for nec fas; Tube feeds started 2/26  Hypoxia, brady arrest/ PEA in pm  2/27  CVVHD initiated  2/28  Pt net neg  6L; New CXR infiltrate - Vanc restarted 2/29- cvvhd off, urine output increased 3/4 Trach  3/5 -1 u PRBC  3/7 - Gtube placed 3/8 - transfer to SDU  SUBJECTIVE:  Denies chest pain, dyspnea.  No further hemoptysis.  VITAL SIGNS: Temp:  [98 F (36.7 C)-99.4 F (37.4 C)] 98.6 F (37 C) (03/08 0744) Pulse Rate:  [67-90] 86 (03/08 0734) Resp:  [17-35] 34 (03/08 0734) BP: (101-151)/(43-75) 135/55 mmHg (03/08 0500) SpO2:  [98 %-100 %] 99 % (03/08 0734) FiO2 (%):  [28 %-40 %] 28 % (03/08 0734) Weight:  [218 lb 7.6 oz (99.1 kg)] 218 lb 7.6 oz (99.1 kg) (03/08 0500)   INTAKE / OUTPUT:  Intake/Output Summary (Last 24 hours) at 10/02/14 0817 Last data filed at 10/02/14 0600  Gross per 24 hour  Intake 1450.83 ml  Output   6525 ml  Net -5074.17 ml   PHYSICAL EXAMINATION: General: no distress Neuro: able to squeeze with both hands, lift legs off bed HEENT: trach site clean, crusting over lips Cardiovascular: regular Lungs: no wheeze Abdomen: Gtube site clean Musculoskeletal: 2+ edema Skin: RLE dressing c/d/i  LABS:  CBC  Recent Labs Lab 09/29/14 0451 09/30/14 0506 10/01/14 0500  WBC 10.5 9.6 9.0  HGB 6.9* 7.4* 7.2*  HCT 21.7* 22.4* 22.0*  PLT 154 188 238   Coag's  Recent Labs Lab 10/01/14 0500  INR 1.02   BMET  Recent Labs Lab 09/30/14 0506 10/01/14 0500 10/02/14 0416  NA 134* 138 142  K 3.9 3.6 3.8  CL 104 107 105  CO2 24 23 28  BUN 51* 55* 57*  CREATININE 1.14* 1.17* 1.09  GLUCOSE 147* 91 144*   Electrolytes  Recent Labs Lab 09/27/14 0450  09/28/14 0500 09/29/14 0451 09/30/14 0506 10/01/14 0500 10/02/14 0416  CALCIUM 8.0*  < > 8.2* 8.3* 8.4 8.6 9.0  MG 2.1  --  1.8 1.6 1.6 2.1 1.9  PHOS 7.2*  --  6.1* 4.6  --   --   --   < > = values in this interval not displayed.   Liver Enzymes  Recent Labs Lab 09/25/14 1134 09/26/14 0459  09/27/14 0450 09/28/14 0500 09/29/14 0451  AST 87* 100*  --  61*  --   --   ALT 145* 139*  --  102*  --    --   ALKPHOS 414* 472*  --  304*  --   --   BILITOT 2.3* 1.8*  --  1.4*  --   --   ALBUMIN 1.8* 1.8*  < > 1.7* 1.7* 1.6*  < > = values in this interval not displayed.  Glucose  Recent Labs Lab 10/01/14 0747 10/01/14 1125 10/01/14 1542 10/01/14 2006 10/01/14 2349 10/02/14 0419  GLUCAP 100* 92 114* 115* 131* 140*    Imaging Ir Gastrostomy Tube Mod Sed  10/01/2014   CLINICAL DATA:  Cardiac arrest  EXAM: PERCUTANEOUS GASTROSTOMY  FLUOROSCOPY TIME:  2 minutes and 42 seconds.  MEDICATIONS AND MEDICAL HISTORY: Versed 1 mg, Fentanyl 25 mcg.  ANESTHESIA/SEDATION: Moderate sedation time: 20 minutes  CONTRAST:  10 cc Omnipaque 300  PROCEDURE: The procedure, risks, benefits, and alternatives were explained to the patient. Questions regarding the procedure were encouraged and answered. The patient understands and consents to the procedure.  The epigastrium was prepped with Betadine in a sterile fashion, and a sterile drape was applied covering the operative field. A sterile gown and sterile gloves were used for the procedure.  A 5-French orogastric tube is placed under fluoroscopic guidance. Scout imaging of the abdomen confirms barium within the transverse colon.  The stomach was distended with gas. Under fluoroscopic guidance, an 18 gauge needle was utilized to puncture the anterior wall of the body of the stomach. An Amplatz wire was advanced through the needle passing a T fastener into the lumen of the stomach. The T fastener was secured for gastropexy. A 9-French sheath was inserted.  A snare was advanced through the 9-French sheath. A Britta Mccreedy was advanced through the orogastric tube. It was snared then pulled out the oral cavity, pulling the snare, as well. The leading edge of the gastrostomy was attached to the snare. It was then pulled down the esophagus and out the percutaneous site. It was secured in place. Contrast was injected.  COMPLICATIONS: None  FINDINGS: The image demonstrates placement of a  20-French pull-through type gastrostomy tube into the body of the stomach.  IMPRESSION: Successful 20 French pull-through gastrostomy.   Electronically Signed   By: Marybelle Killings M.D.   On: 10/01/2014 16:29   Dg Chest Port 1 View  10/01/2014   CLINICAL DATA:  Pneumonia.  EXAM: PORTABLE CHEST - 1 VIEW  COMPARISON:  09/30/2014.  FINDINGS: Tracheostomy tube, feeding tube, left IJ line in stable position. Mild bibasilar and right upper lobe infiltrates again noted. Small left pleural effusion cannot be excluded. No pleural effusion or pneumothorax. Heart size stable.  IMPRESSION: 1. Lines and tubes in stable position. 2. Mild bibasilar and right upper lobe infiltrates again noted. No significant change. Small left pleural effusion cannot be excluded.  Electronically Signed   By: Marcello Moores  Register   On: 10/01/2014 07:32   ASSESSMENT / PLAN:  PULMONARY ETT 2/22 >>> 3/04 Trach (DF) 3/04 >>> A: Acute hypoxic respiratory failure 2nd to PNA, sepsis s/p tracho 3/4.  COPD without evidence of exacerbation. ARDS >> resolved. P:   Trach collar as tolerated F/u CXR intermittently Speech to assess for speech valve  CARDIOVASCULAR CVL R fem 2/22 >> 2/23 CVL L IJ 2/23 >> A:  Shock - Septic +/-  Hypovolemic>improved remains off pressors 3/5.  Hx HTN. Adrenal Insufficiency >> resolved. P:  Continue labetalol Lasix 40 mg IV q8h x 2 on 3/08  RENAL A:   Mixed metabolic acidosis - due to lactate & renal failure -->resolved. Acute renal failure -residual ATN diuresis. Rhabdo - improving with IVF resuscitation> CK returned nml.  Pseudohypocalcemia - corrects to wnl. Hypernatremia -Resolved.  P:   Continue maint Free water  F/u BMET   GASTROINTESTINAL A:  Transaminitis - resolved. GERD. Obesity.  Severe protein calorie malnutrition. diarhea -C diff neg. P:   Pantoprazole Tube feeds tolerated Imodium prn  HEMATOLOGIC A:   Anemia -chronic dz -s/p PRBC x 1 on 3/3 , 3/5.  VTE  prophylaxis. Thrombocytopenia - resolved. P:  F/u CBC intermittently sub q hep  INFECTIOUS A:   Group A Strep Cellulitis/PNA with Septic Shock >> completed Abx 3/07. P:   Resp sputum >>> RARE GROUP A STREP (S.PYOGENES) ISOLATED  Monitor clinically   ENDOCRINE A:   Hyperglycemia. P:   SSI while on tube feeds  NEUROLOGIC A:   Acute metabolic encephalopathy 2nd to sepsis, respiratory failure, renal failure >> resolved. Hx Depression, Anxiety, Pain. Functional quadriplegia s/p critical illness... Currently working dx is critical care polyneuropathy >> improving. P:   PT/OT  D/c CVL, PT/OT assessment, Speech assessment, Transfer to SDU >> keep on PCCM service.  Chesley Mires, MD [facility] Pulmonary/Critical Care 10/02/2014, 8:24 AM Pager:  [phone] After 3pm call: [phone]

## 2014-10-02 NOTE — Progress Notes (Signed)
NUTRITION FOLLOW-UP  INTERVENTION:  Change TF to Vital AF 1.2 at 70 ml/h (1680 ml/day) via PEG to provide 2016 kcals, 126 gm protein, and 1362 ml free water daily.  NUTRITION DIAGNOSIS: Inadequate oral intake related to inability to eat as evidenced by NPO status; ongoing.  New Goal: Intake to meet >90% of estimated nutrition needs.  Monitor:  TF tolerance/adequacy, weight trend, labs.  ASSESSMENT: Patient was transferred from [facility] to [facility] on 2/22 with respiratory distress, acute renal failure, AG metabolic acidosis, and shock.  S/P PEG placement on 3/7. Patient is currently receiving Vital High Protein via PEG at 70 ml/h (1680 ml/day) to provide 1680 kcals, 147 gm protein, 1404 ml free water daily.   Currently on trach collar, no longer requiring vent support. RN reports patient is tolerating TF well, but says that she is hungry. Now that patient is off the ventilator, can increase calorie provision.  Height: Ht Readings from Last 1 Encounters:  09/18/14 5\' 7"  (1.702 m)   Weight: Wt Readings from Last 1 Encounters:  10/02/14 218 lb 7.6 oz (99.1 kg)   Admission weight: 245 lb 2.4 oz (111.2 kg)  BMI:  Body mass index is 34.21 kg/(m^2). obesity class II  Estimated Nutritional Needs: Kcal: 1900-2100 Protein: 120-130 gm Fluid: >/= 2 L  Skin: MASD  Diet Order:  NPO  Intake/Output Summary (Last 24 hours) at 10/02/14 1528 Last data filed at 10/02/14 1500  Gross per 24 hour  Intake 2090.83 ml  Output   8550 ml  Net -6459.17 ml    Last BM: 3/7   Labs:   Recent Labs Lab 09/27/14 0450  09/28/14 0500 09/29/14 0451 09/30/14 0506 10/01/14 0500 10/02/14 0416  NA 153*  < > 147* 137 134* 138 142  K 3.1*  < > 4.1 3.8 3.9 3.6 3.8  CL 116*  < > 116* 109 104 107 105  CO2 25  < > 27 24 24 23 28   BUN 108*  < > 78* 54* 51* 55* 57*  CREATININE 1.88*  < > 1.49* 1.22* 1.14* 1.17* 1.09  CALCIUM 8.0*  < > 8.2* 8.3* 8.4 8.6 9.0  MG 2.1  --  1.8 1.6 1.6 2.1 1.9   PHOS 7.2*  --  6.1* 4.6  --   --   --   GLUCOSE 213*  < > 171* 174* 147* 91 144*  < > = values in this interval not displayed.  CBG (last 3)   Recent Labs  10/02/14 0419 10/02/14 0743 10/02/14 1227  GLUCAP 140* 133* 124*    Scheduled Meds: . antiseptic oral rinse  7 mL Mouth Rinse QID  . chlorhexidine  15 mL Mouth Rinse BID  . free water  250 mL Per Tube 3 times per day  . heparin subcutaneous  5,000 Units Subcutaneous 3 times per day  . insulin aspart  0-20 Units Subcutaneous 6 times per day  . labetalol  200 mg Per Tube BID  . pantoprazole sodium  40 mg Per Tube Daily    Continuous Infusions: . feeding supplement (VITAL HIGH PROTEIN) 1,000 mL (10/02/14 0600)    Molli Barrows, RD, LDN, [place] Pager [phone] After Hours Pager [phone]

## 2014-10-02 NOTE — Progress Notes (Signed)
Orthopedic Tech Progress Note Patient Details:  Jamie Fields 03-03-1956 [id number] Prafo applied; care instructions explained to nursing Ortho Devices Type of Ortho Device: Postop shoe/boot, Other (comment) Ortho Device/Splint Location: LLE Prafo Ortho Device/Splint Interventions: Application   Asia R Thompson 10/02/2014, 2:39 PM

## 2014-10-03 ENCOUNTER — Inpatient Hospital Stay (HOSPITAL_COMMUNITY): Payer: Commercial Managed Care - HMO

## 2014-10-03 DIAGNOSIS — R400 Somnolence: Secondary | ICD-10-CM

## 2014-10-03 LAB — BASIC METABOLIC PANEL
ANION GAP: 8 (ref 5–15)
BUN: 61 mg/dL — AB (ref 6–23)
CO2: 32 mmol/L (ref 19–32)
Calcium: 10 mg/dL (ref 8.4–10.5)
Chloride: 106 mmol/L (ref 96–112)
Creatinine, Ser: 1.17 mg/dL — ABNORMAL HIGH (ref 0.50–1.10)
GFR calc non Af Amer: 50 mL/min — ABNORMAL LOW (ref >90)
GFR, EST AFRICAN AMERICAN: 58 mL/min — AB (ref >90)
GLUCOSE: 166 mg/dL — AB (ref 70–99)
POTASSIUM: 3.5 mmol/L (ref 3.5–5.1)
SODIUM: 146 mmol/L — AB (ref 135–145)

## 2014-10-03 LAB — BLOOD GAS, ARTERIAL
Acid-Base Excess: 7.1 mmol/L — ABNORMAL HIGH (ref 0.0–2.0)
BICARBONATE: 30.2 meq/L — AB (ref 20.0–24.0)
Drawn by: 36529
FIO2: 0.28 %
O2 Saturation: 96 %
PH ART: 7.538 — AB (ref 7.350–7.450)
PO2 ART: 76.7 mmHg — AB (ref 80.0–100.0)
Patient temperature: 98.6
TCO2: 31.3 mmol/L (ref 0–100)
pCO2 arterial: 35.6 mmHg (ref 35.0–45.0)

## 2014-10-03 LAB — CBC
HCT: 25.2 % — ABNORMAL LOW (ref 36.0–46.0)
HEMOGLOBIN: 8.2 g/dL — AB (ref 12.0–15.0)
MCH: 29.1 pg (ref 26.0–34.0)
MCHC: 32.5 g/dL (ref 30.0–36.0)
MCV: 89.4 fL (ref 78.0–100.0)
Platelets: 458 10*3/uL — ABNORMAL HIGH (ref 150–400)
RBC: 2.82 MIL/uL — ABNORMAL LOW (ref 3.87–5.11)
RDW: 21.2 % — AB (ref 11.5–15.5)
WBC: 7.3 10*3/uL (ref 4.0–10.5)

## 2014-10-03 LAB — GLUCOSE, CAPILLARY
GLUCOSE-CAPILLARY: 149 mg/dL — AB (ref 70–99)
Glucose-Capillary: 156 mg/dL — ABNORMAL HIGH (ref 70–99)
Glucose-Capillary: 169 mg/dL — ABNORMAL HIGH (ref 70–99)
Glucose-Capillary: 130 mg/dL — ABNORMAL HIGH (ref 70–99)
Glucose-Capillary: 126 mg/dL — ABNORMAL HIGH (ref 70–99)
Glucose-Capillary: 135 mg/dL — ABNORMAL HIGH (ref 70–99)
Glucose-Capillary: 150 mg/dL — ABNORMAL HIGH (ref 70–99)

## 2014-10-03 MED ORDER — LORAZEPAM 2 MG/ML IJ SOLN
0.5000 mg | Freq: Four times a day (QID) | INTRAMUSCULAR | Status: DC | PRN
  Administered 2014-10-03 – 2014-10-09 (×5): 0.5 mg via INTRAVENOUS
  Filled 2014-10-03 (×5): qty 1

## 2014-10-03 MED ORDER — LORAZEPAM 2 MG/ML IJ SOLN
1.0000 mg | Freq: Once | INTRAMUSCULAR | Status: AC
  Administered 2014-10-03: 1 mg via INTRAVENOUS

## 2014-10-03 MED ORDER — LORAZEPAM 2 MG/ML IJ SOLN
INTRAMUSCULAR | Status: AC
  Filled 2014-10-03: qty 1

## 2014-10-03 NOTE — Progress Notes (Signed)
NEURO HOSPITALIST PROGRESS NOTE   SUBJECTIVE:                                                                                                                        Awake, interacting with family at the bedside. Unable to move lower extremities and minimal motor activity shoulders and weak grip bilaterally. Repeat MRI brain 3/6 showed no acute abnormality. MRI cervical spine unrevealing.  OBJECTIVE:                                                                                                                           Vital signs in last 24 hours: Temp:  [98.4 F (36.9 C)-100.7 F (38.2 C)] 98.4 F (36.9 C) (03/09 0807) Pulse Rate:  [75-106] 89 (03/09 0916) Resp:  [16-43] 29 (03/09 0900) BP: (111-157)/(45-77) 138/62 mmHg (03/09 0916) SpO2:  [93 %-100 %] 95 % (03/09 0900) FiO2 (%):  [28 %] 28 % (03/09 0900) Weight:  [99.6 kg (219 lb 9.3 oz)] 99.6 kg (219 lb 9.3 oz) (03/09 0500)  Intake/Output from previous day: 03/08 0701 - 03/09 0700 In: 1390 [I.V.:20; NG/GT:1330] Out: 3900 [Urine:3900] Intake/Output this shift: Total I/O In: -  Out: 1000 [Urine:1000] Nutritional status:    Past Medical History  Diagnosis Date  . COPD (chronic obstructive pulmonary disease)   . GERD (gastroesophageal reflux disease)   . Asthma   . HTN (hypertension)   . Depression   . Anxiety    Physical exam: no apparent distress. Head: normocephalic. Neck: supple, no bruits, no JVD. Cardiac: no murmurs. Lungs: clear. Abdomen: soft, no tender, no mass. Extremities: edema upper and lower extremities. Skin: no rash  Neurologic Exam:  Mental status: open eyes to verbal commands and is able to follow commands. CN 2-12: pupils 4 mm bilaterally reactive. EOM full without nystagmus. Face symmetric. Tongue midline. Motor: very weak grip, can not move upper ext proximally. Moving lower on command. Sensory" light touch intact. DTR's: unable to elicit knee jerk and  biceps reflexes. Plantars: downgoing. Coordination and gait: unable to test  Lab Results: No results found for: CHOL Lipid Panel No results for input(s): CHOL, TRIG, HDL, CHOLHDL, VLDL, LDLCALC in the last 72 hours.  Studies/Results: Ir Gastrostomy Tube Mod Sed  10/01/2014   CLINICAL DATA:  Cardiac arrest  EXAM: PERCUTANEOUS GASTROSTOMY  FLUOROSCOPY TIME:  2 minutes and 42 seconds.  MEDICATIONS AND MEDICAL HISTORY: Versed 1 mg, Fentanyl 25 mcg.  ANESTHESIA/SEDATION: Moderate sedation time: 20 minutes  CONTRAST:  10 cc Omnipaque 300  PROCEDURE: The procedure, risks, benefits, and alternatives were explained to the patient. Questions regarding the procedure were encouraged and answered. The patient understands and consents to the procedure.  The epigastrium was prepped with Betadine in a sterile fashion, and a sterile drape was applied covering the operative field. A sterile gown and sterile gloves were used for the procedure.  A 5-French orogastric tube is placed under fluoroscopic guidance. Scout imaging of the abdomen confirms barium within the transverse colon.  The stomach was distended with gas. Under fluoroscopic guidance, an 18 gauge needle was utilized to puncture the anterior wall of the body of the stomach. An Amplatz wire was advanced through the needle passing a T fastener into the lumen of the stomach. The T fastener was secured for gastropexy. A 9-French sheath was inserted.  A snare was advanced through the 9-French sheath. A Britta Mccreedy was advanced through the orogastric tube. It was snared then pulled out the oral cavity, pulling the snare, as well. The leading edge of the gastrostomy was attached to the snare. It was then pulled down the esophagus and out the percutaneous site. It was secured in place. Contrast was injected.  COMPLICATIONS: None  FINDINGS: The image demonstrates placement of a 20-French pull-through type gastrostomy tube into the body of the stomach.  IMPRESSION: Successful 20  French pull-through gastrostomy.   Electronically Signed   By: Marybelle Killings M.D.   On: 10/01/2014 16:29    MEDICATIONS                                                                                                                        Scheduled: . antiseptic oral rinse  7 mL Mouth Rinse QID  . chlorhexidine  15 mL Mouth Rinse BID  . free water  250 mL Per Tube 3 times per day  . heparin subcutaneous  5,000 Units Subcutaneous 3 times per day  . insulin aspart  0-20 Units Subcutaneous 6 times per day  . labetalol  200 mg Per Tube BID  . LORazepam      . pantoprazole sodium  40 mg Per Tube Daily    ASSESSMENT/PLAN:  59 yo F with AMS following cardiac arrest. She has markedly improved in mental status, with improved movements lower extremities but still minimal movements arms. Unrevealing MRI brain and cervical spine. Suspect a peripheral process like critical illness polyneuropathy. EMG/NCS will be helpful but not available in the hospital. Will continue to follow.  Dorian Pod, MD [facility] [phone]  10/03/2014, 11:03 AM

## 2014-10-03 NOTE — Progress Notes (Signed)
PULMONARY / CRITICAL CARE MEDICINE   Name: Jamie Fields MRN: [id number] DOB: June 07, 1956    ADMISSION DATE:  09/17/2014 CONSULTATION DATE:  09/17/2014   REFERRING MD :  [facility]  CHIEF COMPLAINT:  Respiratory Distress  INITIAL PRESENTATION:  59 y.o. F who was taken to [facility] ED early AM hours 2/22 for respiratory distress.  She was admitted and placed on BiPAP.  Was also found to have acute renal failure, AG metabolic acidosis, Overnight, her acidosis and respiratory status worsened to the point she required intubation.  She was also placed on levophed for persistent shock.  Later in day 2/22 was transferred to [facility] for further management.  STUDIES:  CT Chest 2/22 >> no acute process CT abd / pelv 2/22 >> iliac lymph nodes likely reactive.  No acute process CXR 2/23  >> Persistent mild vascular congestion and mild interstitial edema bilaterally. Superimposed infiltrate right upper lobe and left base cannot be excluded. Echo 6/50 >> Systolic function was normal. EF 60% to 65%. Right Tib/Fib DG >> No evidence of osteo; Mild soft tissue swelling RUQ [place] 2/23 >> Liver echogenicity is diffusely increased and somewhat heterogeneous. Most likely are indicative of hepatic steatosis CT tib/fib Rt >> Negative for osteomyelitis or necrotizing fasciitis. Superficial edema and blistering CXR 2/28 >> No change in bilateral airspace disease opacities representing multifocal pneumonia versus asymmetric edema 3/1 ct head >>> neg 3/1 eeg >> slow, enceph 3/2 MRI brain >>> neg acute, sulci flare, may be related to high fio2 3/3 awake, but not moving extremities 3/3 mri neck - neg   SIGNIFICANT EVENTS: 2/22  Admitted at [place]; respiratory failure-intubated; shock; transferred to [facility] 2/23  On [place]; intubated; Fever, CXR infiltrate = sepsis 2/24  Group A strep, added [place]; CT neg for nec fas; Tube feeds started 2/26  Hypoxia, brady arrest/ PEA in pm  2/27  CVVHD initiated  2/28  Pt  net neg 6L; New CXR infiltrate - Vanc restarted 2/29- cvvhd off, urine output increased 3/4 Trach  3/5 -1 u PRBC  3/7 - Gtube placed 3/8 - transfer to SDU  SUBJECTIVE: Tachypnea overnight and into this AM. SpO2 95-100% on trach collar, not tachycardic. Answering questions, following commands. Denies pain.    VITAL SIGNS: Temp:  [98.4 F (36.9 C)-100.7 F (38.2 C)] 98.4 F (36.9 C) (03/09 0807) Pulse Rate:  [75-106] 89 (03/09 0916) Resp:  [16-43] 29 (03/09 0900) BP: (111-157)/(45-77) 138/62 mmHg (03/09 0916) SpO2:  [93 %-100 %] 95 % (03/09 0900) FiO2 (%):  [28 %] 28 % (03/09 0900) Weight:  [219 lb 9.3 oz (99.6 kg)] 219 lb 9.3 oz (99.6 kg) (03/09 0500)   INTAKE / OUTPUT:  Intake/Output Summary (Last 24 hours) at 10/03/14 1104 Last data filed at 10/03/14 0809  Gross per 24 hour  Intake   1000 ml  Output   4350 ml  Net  -3350 ml   PHYSICAL EXAMINATION: General: Obese white female, NAD. Tachypneic.  Neuro: PERRL, EOMI. Able to squeeze with both hands, lift legs off bed HEENT: Trach site clean, crusting over lips. Cardiovascular: RRR, no murmur, gallops, rubs.  Lungs: Lungs clear to air entry bilaterally. No wheezes or rales. Scattered rhonchi.  Abdomen: Soft, non-tender, non-distended. BS +. G-tube site clean. Musculoskeletal: No gross deformities. +1 pitting edema.  Skin: Generally intact.   LABS:  CBC  Recent Labs Lab 09/30/14 0506 10/01/14 0500 10/03/14 0423  WBC 9.6 9.0 7.3  HGB 7.4* 7.2* 8.2*  HCT 22.4* 22.0*  25.2*  PLT 188 238 458*   Coag's  Recent Labs Lab 10/01/14 0500  INR 1.02   BMET  Recent Labs Lab 10/01/14 0500 10/02/14 0416 10/03/14 0423  NA 138 142 146*  K 3.6 3.8 3.5  CL 107 105 106  CO2 23 28 32  BUN 55* 57* 61*  CREATININE 1.17* 1.09 1.17*  GLUCOSE 91 144* 166*   Electrolytes  Recent Labs Lab 09/27/14 0450  09/28/14 0500 09/29/14 0451 09/30/14 0506 10/01/14 0500 10/02/14 0416 10/03/14 0423  CALCIUM 8.0*  < > 8.2*  8.3* 8.4 8.6 9.0 10.0  MG 2.1  --  1.8 1.6 1.6 2.1 1.9  --   PHOS 7.2*  --  6.1* 4.6  --   --   --   --   < > = values in this interval not displayed.   Liver Enzymes  Recent Labs Lab 09/27/14 0450 09/28/14 0500 09/29/14 0451  AST 61*  --   --   ALT 102*  --   --   ALKPHOS 304*  --   --   BILITOT 1.4*  --   --   ALBUMIN 1.7* 1.7* 1.6*    Glucose  Recent Labs Lab 10/02/14 1227 10/02/14 1751 10/02/14 1939 10/03/14 0213 10/03/14 0434 10/03/14 0810  GLUCAP 124* 145* 141* 156* 149* 169*    Imaging No results found.   ASSESSMENT / PLAN:  PULMONARY ETT 2/22 >>> 3/04 Trach (DF) 3/04 >>> A: Acute hypoxic respiratory failure 2/2 to PNA, sepsis s/p trach 3/4.  COPD without evidence of exacerbation. ARDS >> resolved. Tachypnea P:   Trach collar (FiO2 28%) Repeat CXR this AM improved overall SLP for [employer] Valve Albuterol/Duoneb prn Dc lasix further, was neg bit renal fxn concerning Residual ARDS remains but pcxr is better ABg , ensure no vent support needed  CARDIOVASCULAR CVL R fem 2/22 >> 2/23 CVL LIJ 2/23 >> 3/8  A:  Shock, Septic/Hypovolemic. Remains off pressors since 3/5 HTN Adrenal Insufficiency >> resolved. Normal ECHO 09/18/14 P:  Continue Labetalol 200 mg bid per tube Labetalol IV prn  RENAL A:   Mixed metabolic acidosis 2/2 lactic acidosis/acute renal failure. Resolved.  Acute renal failure; resolved.  CKD stage 2; Cr at baseline (1.1) Rhabdo; resolved.  Pseudohypocalcemia; resolved.  Hypernatremia; 146 this AM P:   Continue maintenance free water 250 cc q8h BMP in AM   GASTROINTESTINAL A:  Transaminitis; resolved. GERD Obesity Severe protein calorie malnutrition Diarrhea; C. Diff NEG P:   Protonix Tube feeds Imodium prn Repeat LFT's in AM  HEMATOLOGIC A:   Anemia of Chronic Disease; s/p PRBC x 1 on 3/3, 3/5. Hb improving DVT/PE PPx Thrombocytopenia; resolved, now thrombocytosis, likely reactive (458K) P:  Repeat CBC  in AM Heparin [place]/SCD's  INFECTIOUS A:   GAS Cellulitis/Pneumonia w/ Septic Shock >> completed Abx 3/07 P:   Resp sputum >>> RARE GROUP A STREP (S.PYOGENES) ISOLATED Monitor clinically   ENDOCRINE A:   Hyperglycemia P:   SSI while on tube feeds  NEUROLOGIC A:   Acute metabolic encephalopathy; resolved. H/o Depression, Anxiety, Chronic Pain. Functional quadriplegia s/p critical illness/critical care polyneuropathy; improving. MRI Brain/cervical spine 3/7 w/ no acute findings P:   Neuro following; consider EMG as an outpatient.  PT/OT >>> LTACH vs SNF   Natasha Bence, MD PGY-2, Internal Medicine Pager: [phone]  STAFF NOTE: Linwood Dibbles, MD FACP have personally reviewed patient's available data, including medical history, events of note, physical examination and test results as part of  my evaluation. I have discussed with resident/NP and other care providers such as pharmacist, RN and RRT. In addition, I personally evaluated patient and elicited key findings of: ABG some increase rr, may need vent support, pcxr improved, treat anxiety, update son, off abx, dc further lasix, Na noted, free water, BS improved overall  [name]. Titus Mould, MD, [place] Pgr: [facility] 10/03/2014 2:57 PM

## 2014-10-03 NOTE — Clinical Social Work Note (Signed)
CSW spoke with pt son- agreeable to SNF.  CSW started bed search.  Full assessment to follow.  Domenica Reamer, [place] Social Worker [phone]

## 2014-10-03 NOTE — Clinical Social Work Psychosocial (Signed)
Clinical Social Work Department BRIEF PSYCHOSOCIAL ASSESSMENT 10/03/2014  Patient:  [facility]     Account Number:  [id number]     Admit date:  09/17/2014  Clinical Social Worker:  Domenica Reamer, [place]  Date/Time:  10/03/2014 11:30 AM  Referred by:  Physician  Date Referred:  10/03/2014 Referred for  SNF Placement   Other Referral:   Interview type:  Family Other interview type:    PSYCHOSOCIAL DATA Living Status:  FAMILY Admitted from facility:   Level of care:   Primary support name:  Ames Dura Primary support relationship to patient:  CHILD, ADULT Degree of support available:   high level of support from patient son and dtr in law    CURRENT CONCERNS Current Concerns  Post-Acute Placement   Other Concerns:    SOCIAL WORK ASSESSMENT / PLAN CSW spoke with patient son at bedside.    Pt is on a trach and was unable to participate in the interview- did not appear responsive other than some moaning.    CSW spoke with patient son concerning SNF placement to continue rehab and medical treatment.  Patient son is agreeable to placement and does not feel as if they could care for patient at home properly.    Pt son reports that patient usually lives with her brother who is also disabled (patient and pt brother are both on disability and help to take care of one another)    CSW will continue to follow.   Assessment/plan status:  Psychosocial Support/Ongoing Assessment of Needs Other assessment/ plan:   FL2   Information/referral to community resources:    PATIENT'S/FAMILY'S RESPONSE TO PLAN OF CARE: Patient family is agreeable to SNF placement but were hesitant about placing the patient in a "nursing home" they express fear that the patient will never be able to recover enough to go home.       Domenica Reamer, [place] Social Worker [phone]

## 2014-10-03 NOTE — Progress Notes (Signed)
Report given to 2H. Patient given tylenol via PEG for temp. Patient is ready for transport.

## 2014-10-03 NOTE — Progress Notes (Signed)
[place] Progress Note Patient Name: Jamie Fields DOB: [birth year] MRN: [id number]   Date of Service  10/03/2014  HPI/Events of Note  Fever, cloudy urine, Foley leaking  eICU Interventions  Change Foley cath UA, micro and cx ordered     Intervention Category Intermediate Interventions: Other:  Merton Border 10/03/2014, 10:49 PM

## 2014-10-03 NOTE — Progress Notes (Signed)
Speech Language Pathology Treatment: Jamie Fields Speaking valve  Patient Details Name: Jamie Fields MRN: [id number] DOB: [birth year] Today's Date: 10/03/2014 Time: 7062-3762 SLP Time Calculation (min) (ACUTE ONLY): 15 min  Assessment / Plan / Recommendation Clinical Impression  Pt continues to have difficulty tolerating PMV.  Alert, following commands; oral care carefully provided (bleeding ulcers cover lips).  PMV placed for brief intervals of 1-2 respiratory cycles.  Despite max visual/verbal cues and repeated efforts by pt, she was unable to move air through upper airway, neither for speech nor non-speech tasks. Removal of valve revealed air trapping.  Not tolerating PMV; not ready for swallow assessment.  Will continue to follow for readiness.      HPI HPI: 59 y.o. F who was taken to [facility] ED early AM hours 2/22 for respiratory distress intubated and transferred to [facility]. 2/26 bradycardia with PEA. Pt with renal failure, shock, encephalopathy, polyneuropathy, CVVHD 2/27-2/29, ETT 2/22-3/04; trach 3/4; Gtube 3/7.     Pertinent Vitals    SLP Plan  Continue with current plan of care    Recommendations        Patient may use Passy-Muir Speech Valve: with SLP only PMSV Supervision: Full       Oral Care Recommendations: Oral care Q4 per protocol Follow up Recommendations:  (tba) Plan: Continue with current plan of care   Jamie Fields L. Tivis Ringer, [place] CCC/SLP Pager [phone]      Jamie Fields 10/03/2014, 10:51 AM

## 2014-10-03 NOTE — Consult Note (Signed)
Referral received to assess for care management services.  Met with the patient, son, Ollen Gross, and daughter-in-law regarding the benefits of [facility] Care Management services. Patient gave permission by shaking her head to services explained.  Patient's son wanted to review information for [facility] Care Management and a folder was provided with contact information.  Explained that [facility] does not interfere with or replace any services arranged by the inpatient care management staff.  Son verbalized that he would follow up as he would be the person signing the forms and patient shook her head yes.  For questions, contact Natividad Brood, RN, BSN, [place] [facility] Liaison at [phone].

## 2014-10-03 NOTE — Progress Notes (Signed)
[place] Progress Note Patient Name: Jamie Fields DOB: Jul 16, 1956 MRN: [id number]   Date of Service  10/03/2014  HPI/Events of Note  Call from bedside nurse reporting that patient's resp rate has increased to the 30s-40s.  The patient is communicating she is have difficulty  Breathing.  Had an episode earlier but this resolved.  Patient is HD stable and maintaining her sats.  RR is ranging from 20s to 30s.  Nurse suspects there may be a component of anxiety.  Cannot camera into room.  eICU Interventions  Ativan 1 mg IV times one dose now.     Intervention Category Intermediate Interventions: Respiratory distress - evaluation and management Minor Interventions: Agitation / anxiety - evaluation and management  DETERDING,ELIZABETH 10/03/2014, 6:39 AM

## 2014-10-03 NOTE — Clinical Social Work Placement (Addendum)
Clinical Social Work Department CLINICAL SOCIAL WORK PLACEMENT NOTE 10/03/2014  Patient:  Jamie Fields, Jamie Fields  Account Number:  [id number] Admit date:  09/17/2014  Clinical Social Worker:  Domenica Reamer, CLINICAL SOCIAL WORKER  Date/time:  10/03/2014 01:30 PM  Clinical Social Work is seeking post-discharge placement for this patient at the following level of care:   SKILLED NURSING   (*CSW will update this form in Epic as items are completed)   10/03/2014  Patient/family provided with [place] Department of Clinical Social Work's list of facilities offering this level of care within the geographic area requested by the patient (or if unable, by the patient's family).  10/03/2014  Patient/family informed of their freedom to choose among providers that offer the needed level of care, that participate in Medicare, Medicaid or managed care program needed by the patient, have an available bed and are willing to accept the patient.  10/03/2014  Patient/family informed of [facility]' ownership interest in [facility], as well as of the fact that they are under no obligation to receive care at this facility.  PASARR submitted to EDS on 10/03/2014 PASARR number received on 10/03/2014  FL2 transmitted to all facilities in geographic area requested by pt/family on  10/03/2014 FL2 transmitted to all facilities within larger geographic area on   Patient informed that his/her managed care company has contracts with or will negotiate with  certain facilities, including the following:     Patient/family informed of bed offers received:   10/08/2014 Patient chooses bed at [facility]  Physician recommends and patient chooses bed at    Patient to be transferred to [facility] on  317/16  Patient to be transferred to facility by [facility]) Patient and family notified of transfer on  10/11/14 Name of family member notified: Ames Dura-  son  The following  physician request were entered in Epic:   Additional Comments: Domenica Reamer, [place] Worker [phone]  10/11/14 OK per MD for d/c to SNF today.  Patient is alert and orient to person and agreed to d/c today to SNF.  Son Ollen Gross notified and stated that he would go to the facility this afternoon to sign admission papers as soon as he could get off work. He was please with d/c plan.  Nursing notified to call report.  CSW signing off.  Lorie Phenix. Pauline Good, [place] (coverage)

## 2014-10-04 ENCOUNTER — Inpatient Hospital Stay (HOSPITAL_COMMUNITY): Payer: Commercial Managed Care - HMO

## 2014-10-04 LAB — URINALYSIS, ROUTINE W REFLEX MICROSCOPIC
Bilirubin Urine: NEGATIVE
Glucose, UA: NEGATIVE mg/dL
Ketones, ur: NEGATIVE mg/dL
NITRITE: NEGATIVE
Protein, ur: 30 mg/dL — AB
Specific Gravity, Urine: 1.02 (ref 1.005–1.030)
Urobilinogen, UA: 1 mg/dL (ref 0.0–1.0)
pH: 6 (ref 5.0–8.0)

## 2014-10-04 LAB — GLUCOSE, CAPILLARY
GLUCOSE-CAPILLARY: 142 mg/dL — AB (ref 70–99)
GLUCOSE-CAPILLARY: 147 mg/dL — AB (ref 70–99)
GLUCOSE-CAPILLARY: 144 mg/dL — AB (ref 70–99)
Glucose-Capillary: 148 mg/dL — ABNORMAL HIGH (ref 70–99)
Glucose-Capillary: 125 mg/dL — ABNORMAL HIGH (ref 70–99)
Glucose-Capillary: 140 mg/dL — ABNORMAL HIGH (ref 70–99)

## 2014-10-04 LAB — CBC
HEMATOCRIT: 23.1 % — AB (ref 36.0–46.0)
HEMOGLOBIN: 7 g/dL — AB (ref 12.0–15.0)
MCH: 27.8 pg (ref 26.0–34.0)
MCHC: 30.3 g/dL (ref 30.0–36.0)
MCV: 91.7 fL (ref 78.0–100.0)
Platelets: 314 10*3/uL (ref 150–400)
RBC: 2.52 MIL/uL — ABNORMAL LOW (ref 3.87–5.11)
RDW: 20.8 % — ABNORMAL HIGH (ref 11.5–15.5)
WBC: 5.1 10*3/uL (ref 4.0–10.5)

## 2014-10-04 LAB — URINE MICROSCOPIC-ADD ON

## 2014-10-04 LAB — COMPREHENSIVE METABOLIC PANEL
ALBUMIN: 2 g/dL — AB (ref 3.5–5.2)
ALK PHOS: 280 U/L — AB (ref 39–117)
ALT: 126 U/L — ABNORMAL HIGH (ref 0–35)
AST: 127 U/L — ABNORMAL HIGH (ref 0–37)
Anion gap: 5 (ref 5–15)
BILIRUBIN TOTAL: 0.7 mg/dL (ref 0.3–1.2)
BUN: 63 mg/dL — ABNORMAL HIGH (ref 6–23)
CHLORIDE: 113 mmol/L — AB (ref 96–112)
CO2: 29 mmol/L (ref 19–32)
Calcium: 9.2 mg/dL (ref 8.4–10.5)
Creatinine, Ser: 1.25 mg/dL — ABNORMAL HIGH (ref 0.50–1.10)
GFR calc Af Amer: 53 mL/min — ABNORMAL LOW (ref >90)
GFR, EST NON AFRICAN AMERICAN: 46 mL/min — AB (ref >90)
Glucose, Bld: 154 mg/dL — ABNORMAL HIGH (ref 70–99)
Potassium: 3.3 mmol/L — ABNORMAL LOW (ref 3.5–5.1)
Sodium: 147 mmol/L — ABNORMAL HIGH (ref 135–145)
TOTAL PROTEIN: 6.9 g/dL (ref 6.0–8.3)

## 2014-10-04 MED ORDER — DEXTROSE 5 % IV SOLN
INTRAVENOUS | Status: DC
  Administered 2014-10-04: 10:00:00 via INTRAVENOUS
  Administered 2014-10-05: 1 mL via INTRAVENOUS
  Administered 2014-10-06 (×2): via INTRAVENOUS

## 2014-10-04 MED ORDER — POTASSIUM CHLORIDE 20 MEQ/15ML (10%) PO SOLN
40.0000 meq | Freq: Once | ORAL | Status: AC
  Administered 2014-10-04: 40 meq
  Filled 2014-10-04: qty 30

## 2014-10-04 MED ORDER — DEXTROSE 5 % IV SOLN
1.0000 g | Freq: Three times a day (TID) | INTRAVENOUS | Status: DC
  Administered 2014-10-04 – 2014-10-11 (×22): 1 g via INTRAVENOUS
  Filled 2014-10-04 (×24): qty 1

## 2014-10-04 NOTE — Progress Notes (Signed)
PULMONARY / CRITICAL CARE MEDICINE   Name: Jamie Fields MRN: [id number] DOB: 08-28-1955    ADMISSION DATE:  09/17/2014 CONSULTATION DATE:  09/17/2014   REFERRING MD :  [facility]  CHIEF COMPLAINT:  Respiratory Distress  INITIAL PRESENTATION:  59 y.o. F who was taken to [facility] ED early AM hours 2/22 for respiratory distress.  She was admitted and placed on BiPAP.  Was also found to have acute renal failure, AG metabolic acidosis, Overnight, her acidosis and respiratory status worsened to the point she required intubation.  She was also placed on levophed for persistent shock.  Later in day 2/22 was transferred to [facility] for further management.  STUDIES:  CT Chest 2/22 >> no acute process CT abd / pelv 2/22 >> iliac lymph nodes likely reactive.  No acute process CXR 2/23  >> Persistent mild vascular congestion and mild interstitial edema bilaterally. Superimposed infiltrate right upper lobe and left base cannot be excluded. Echo 0/10 >> Systolic function was normal. EF 60% to 65%. Right Tib/Fib DG >> No evidence of osteo; Mild soft tissue swelling RUQ [place] 2/23 >> Liver echogenicity is diffusely increased and somewhat heterogeneous. Most likely are indicative of hepatic steatosis CT tib/fib Rt >> Negative for osteomyelitis or necrotizing fasciitis. Superficial edema and blistering CXR 2/28 >> No change in bilateral airspace disease opacities representing multifocal pneumonia versus asymmetric edema 3/1 ct head >>> neg 3/1 eeg >> slow, enceph 3/2 MRI brain >>> neg acute, sulci flare, may be related to high fio2 3/3 awake, but not moving extremities 3/3 mri neck - neg   SIGNIFICANT EVENTS: 2/22  Admitted at [place]; respiratory failure-intubated; shock; transferred to [facility] 2/23  On [place]; intubated; Fever, CXR infiltrate = sepsis 2/24  Group A strep, added Clinda; CT neg for nec fas; Tube feeds started 2/26  Hypoxia, brady arrest/ PEA in pm  2/27  CVVHD initiated  2/28  Pt  net neg 6L; New CXR infiltrate - Vanc restarted 2/29- cvvhd off, urine output increased 3/4 Trach  3/5 -1 u PRBC  3/7 - Gtube placed 3/8 - transfer to SDU 3/9- failed TC, back to vent, neg 1.4 liters  SUBJECTIVE:  On vent rr resolved   VITAL SIGNS: Temp:  [98.3 F (36.8 C)-102.3 F (39.1 C)] 100.8 F (38.2 C) (03/10 0803) Pulse Rate:  [82-99] 87 (03/10 0803) Resp:  [16-46] 22 (03/10 0803) BP: (103-151)/(29-62) 151/49 mmHg (03/10 0803) SpO2:  [91 %-100 %] 100 % (03/10 0803) FiO2 (%):  [28 %-50 %] 40 % (03/10 0803) Weight:  [103.3 kg (227 lb 11.8 oz)] 103.3 kg (227 lb 11.8 oz) (03/10 0335)   INTAKE / OUTPUT:  Intake/Output Summary (Last 24 hours) at 10/04/14 0834 Last data filed at 10/04/14 0807  Gross per 24 hour  Intake   1400 ml  Output   2150 ml  Net   -750 ml   PHYSICAL EXAMINATION: General: Obese white female, NAD Neuro: PERRL HEENT: Trach site clean Cardiovascular: RRR, no murmur, gallops, rubs.  Lungs: Lungs rhonchi.  Abdomen: Soft, non-tender, non-distended. BS +. G-tube site clean. Musculoskeletal: No gross deformities. +2 pitting edema.  Skin: Generally intact.   LABS:  CBC  Recent Labs Lab 09/30/14 0506 10/01/14 0500 10/03/14 0423  WBC 9.6 9.0 7.3  HGB 7.4* 7.2* 8.2*  HCT 22.4* 22.0* 25.2*  PLT 188 238 458*   Coag's  Recent Labs Lab 10/01/14 0500  INR 1.02   BMET  Recent Labs Lab 10/02/14 0416 10/03/14 0423 10/04/14  0648  NA 142 146* 147*  K 3.8 3.5 3.3*  CL 105 106 113*  CO2 28 32 29  BUN 57* 61* 63*  CREATININE 1.09 1.17* 1.25*  GLUCOSE 144* 166* 154*   Electrolytes  Recent Labs Lab 09/28/14 0500 09/29/14 0451 09/30/14 0506 10/01/14 0500 10/02/14 0416 10/03/14 0423 10/04/14 0648  CALCIUM 8.2* 8.3* 8.4 8.6 9.0 10.0 9.2  MG 1.8 1.6 1.6 2.1 1.9  --   --   PHOS 6.1* 4.6  --   --   --   --   --      Liver Enzymes  Recent Labs Lab 09/28/14 0500 09/29/14 0451 10/04/14 0648  AST  --   --  127*  ALT  --   --   126*  ALKPHOS  --   --  280*  BILITOT  --   --  0.7  ALBUMIN 1.7* 1.6* 2.0*    Glucose  Recent Labs Lab 10/03/14 1616 10/03/14 2029 10/03/14 2126 10/03/14 2324 10/04/14 0338 10/04/14 0806  GLUCAP 126* 135* 150* 148* 142* 147*    Imaging Dg Chest Port 1 View  10/03/2014   CLINICAL DATA:  Tachypnea  EXAM: PORTABLE CHEST - 1 VIEW  COMPARISON:  One-view chest 10/01/2014  FINDINGS: The heart is enlarged. The tracheostomy tube remains in place. A small left effusion is again noted. Mild edema is slightly improved. Aeration in the right upper lobe is improved.  IMPRESSION: 1. Slight improved aeration in the right upper lobe with some persist mild edema and a left pleural effusion. 2. The tracheostomy is stable.   Electronically Signed   By: San Morelle M.D.   On: 10/03/2014 13:58     ASSESSMENT / PLAN:  PULMONARY ETT 2/22 >>> 3/04 Trach (DF) 3/04 >>> A: Acute hypoxic respiratory failure 2/2 to PNA, sepsis s/p trach 3/4.  COPD without evidence of exacerbation. ARDS >> resolved. Tachypnea P:   Maintain vent support noctunrla mandatory Goal 4 hr trach collar again Repeat pcxr Neg balance as able  CARDIOVASCULAR CVL R fem 2/22 >> 2/23 CVL LIJ 2/23 >> 3/8  A:  Shock, Septic/Hypovolemic. Remains off pressors since 3/5 HTN Adrenal Insufficiency >> resolved. Normal ECHO 09/18/14 P:  Continue Labetalol 200 mg bid per tube Labetalol IV prn, dc as not needed Tele Dc lasix  RENAL A:   Mixed metabolic acidosis 2/2 lactic acidosis/acute renal failure. Resolved.  Acute renal failure; resolved.  CKD stage 2; Cr at baseline (1.1) Rhabdo; resolved.  Pseudohypocalcemia; resolved.  Hypernatremia; 146 this AM P:   Free water Increase d5w Dc lasix BMP in AM k supp   GASTROINTESTINAL A:  Transaminitis; resolved. GERD Obesity Severe protein calorie malnutrition Diarrhea; C. Diff NEG P:   Protonix Tube feeds Imodium prn not needed  HEMATOLOGIC A:   Anemia of  Chronic Disease; s/p PRBC x 1 on 3/3, 3/5. Hb improving DVT/PE PPx Thrombocytopenia; resolved, now thrombocytosis, likely reactive (458K) P:  Heparin /SCD's  INFECTIOUS A:   GAS Cellulitis/Pneumonia w/ Septic Shock >> completed Abx 3/07 3/10 fevers, UA concerning P:   Resp sputum >>> RARE GROUP A STREP (S.PYOGENES) ISOLATED Monitor clinically for fever Repeat pcxr UA noted Change foley Culture sent Fevers, add ceftaz  ENDOCRINE A:   Hyperglycemia P:   SSI while on tube feeds  NEUROLOGIC A:   Acute metabolic encephalopathy; resolved. H/o Depression, Anxiety, Chronic Pain. Functional quadriplegia s/p critical illness/critical care polyneuropathy; improving. MRI Brain/cervical spine 3/7 w/ no acute findings P:   Neuro  following; consider EMG as an outpatient.  PT/OT   [name]. Titus Mould, MD, [place] Pgr: [facility] 10/04/2014 8:34 AM

## 2014-10-04 NOTE — Progress Notes (Addendum)
Patient transferred from [facility] to [place]. Upon arrival assessment of foley catheter showed seal broken/removed. Drainage bag on foley has slow leak. Urine in bag amber, sediment present, and malodorous . Patient also presented with fever prior to and on arrival to [facility]. Will continue to monitor.

## 2014-10-04 NOTE — Progress Notes (Signed)
Sutures removed per Dr. Titus Mould, no complications noted. Will continue to monitor pt progress.

## 2014-10-04 NOTE — Progress Notes (Signed)
Physical Therapy Treatment Patient Details Name: Jamie Fields MRN: [id number] DOB: 1955/10/09 Today's Date: 11/03/2014    History of Present Illness 58 y.o. F who was taken to [facility] ED early AM hours 2/22 for respiratory distress intubated and transferred to [facility]. 2/26 bradycardia with PEA. Pt with renal failure, shock, encephalopathy, polyneuropathy, CVVHD 2/27-2/29, trach 3/4, gtube 3/7    PT Comments    Pt making steady progress. Per case management pt not able to go to [facility]. Currently don't feel she can tolerate CIR and will need ST-SNF. If pt improves may be candidate for CIR.  Follow Up Recommendations  SNF     Equipment Recommendations  Other (comment) (to be assessed)    Recommendations for Other Services       Precautions / Restrictions Precautions Precautions: Fall Precaution Comments: trach, gtube    Mobility  Bed Mobility Overal bed mobility: Needs Assistance Bed Mobility: Supine to Sit;Sit to Supine     Supine to sit: +2 for physical assistance;Total assist Sit to supine: +2 for physical assistance;Total assist   General bed mobility comments: Assist for all aspects.  Transfers                    Ambulation/Gait                 Stairs            Wheelchair Mobility    Modified Rankin (Stroke Patients Only)       Balance Overall balance assessment: Needs assistance Sitting-balance support: No upper extremity supported;Feet supported Sitting balance-Leahy Scale: Poor Sitting balance - Comments: Pt sat EOB x 15 minutes with mod A. Verbal cues to hold head up. Pt able to move trunk anteriorly and posteriorly as well as side to side but unable to stabilize without assist.                             Cognition Arousal/Alertness: Awake/alert Behavior During Therapy: WFL for tasks assessed/performed Overall Cognitive Status: Difficult to assess         Following Commands: Follows one step commands  consistently            Exercises      General Comments        Pertinent Vitals/Pain Pain Assessment: No/denies pain    Home Living                      Prior Function            PT Goals (current goals can now be found in the care plan section) Progress towards PT goals: Progressing toward goals    Frequency  Min 3X/week    PT Plan Discharge plan needs to be updated    Co-evaluation             End of Session Equipment Utilized During Treatment: Oxygen Activity Tolerance: Patient tolerated treatment well Patient left: in bed;with call bell/phone within reach     Time: 5945-8592 PT Time Calculation (min) (ACUTE ONLY): 27 min  Charges:  $Therapeutic Activity: 23-37 mins                    G Codes:      Amel Kitch 11-03-14, 12:29 PM  [employer] PT [phone]

## 2014-10-04 NOTE — Progress Notes (Signed)
ANTIBIOTIC CONSULT NOTE - FOLLOW UP  Pharmacy Consult for Ceftazidime Indication: UTI  No Known Allergies  Patient Measurements: Height: 5\' 7"  (170.2 cm) Weight: 227 lb 11.8 oz (103.3 kg) IBW/kg (Calculated) : 61.6  Vital Signs: Temp: 99.8 F (37.7 C) (03/10 1148) Temp Source: Oral (03/10 1148) BP: 127/47 mmHg (03/10 1148) Pulse Rate: 83 (03/10 1148) Intake/Output from previous day: 03/09 0701 - 03/10 0700 In: 1764 [NG/GT:1764] Out: 3100 [Urine:3100] Intake/Output from this shift: Total I/O In: 480 [I.V.:50; Other:100; NG/GT:280; IV Piggyback:50] Out: 375 [Urine:375]  Labs:  Recent Labs  10/02/14 0416 10/03/14 0423 10/04/14 0648 10/04/14 1120  WBC  --  7.3  --  5.1  HGB  --  8.2*  --  7.0*  PLT  --  458*  --  314  CREATININE 1.09 1.17* 1.25*  --    Estimated Creatinine Clearance: 59.9 mL/min (by C-G formula based on Cr of 1.25).  Assessment: 59yof known to pharmacy from previous antibiotic dosing now with fevers and a concerning UA. She will begin ceftazidime for probable UTI. Culture pending.  Unasyn 2/26 >> 3/2 Clinda 2/24 >> 2/26 Levaquin x1 2/24 Tamiflu 2/23 >> 2/24 Zosyn 2/23 >> 2/26 Vanc 2/23 >> 2/26; 2/28 >> 2/29; 3/ 2>> 3/4 Tressie Ellis 3/2 >> 3/7, resume 3/10>>  3/10 Urine -  3/2 BCx x2 - NGTD 2/29 C.diff - negative 2/28 endotracheal cx - negative 2/23 and 2/24 BCx x2 - negative 2/23 RVP - negative 2/23 Urine - neg 2/23 TA - few Candida albicans; rare GAS 2/22 MRSA PCR - neg [place] blood - 1/2 GAS (do not conduct sensitivities)  Goal of Therapy:  Appropriate dosing  Plan:  1) Ceftazidime 1g IV q8 2) Follow up urine culture  Deboraha Sprang 10/04/2014,11:49 AM

## 2014-10-04 NOTE — Progress Notes (Signed)
Speech Language Pathology Treatment: [name] Speaking valve  Patient Details Name: GER NICKS MRN: [id number] DOB: May 08, 1956 Today's Date: 10/04/2014 Time: 1638-4665 SLP Time Calculation (min) (ACUTE ONLY): 29 min  Assessment / Plan / Recommendation Clinical Impression  Pt tolerated PMV much better than  prior session. Pt wore PMV for 25 minutes without evidence of air trapping. Respiratory rate fluctuated between 15and 35; excellent O2 stats. Pt was able to cough and phonate, however, vocal quality intermittently breathy and low intensity due to poor respiratory support; otherwise clear with appropriate intensity when given verbal cues for complete inhalation before phonation. Recommend pt wear PMV intermittently with full supervision. Speech will follow-up with PMV treatment and swallowing eval with PMV.    HPI HPI: 59 y.o. F who was taken to [facility] ED early AM hours 2/22 for respiratory distress intubated and transferred to [facility]. 2/26 bradycardia with PEA. Pt with renal failure, shock, encephalopathy, polyneuropathy, CVVHD 2/27-2/29, ETT 2/22-3/04; trach 3/4; Gtube 3/7.     Pertinent Vitals    SLP Plan  Continue with current plan of care    Recommendations        Patient may use Passy-Muir Speech Valve: Intermittently with supervision PMSV Supervision: Full       Oral Care Recommendations: Oral care Q4 per protocol Plan: Continue with current plan of care    GO     Bermudez-Bosch, Deshonna Trnka 10/04/2014, 11:47 AM

## 2014-10-04 NOTE — Progress Notes (Signed)
Occupational Therapy Treatment Patient Details Name: Jamie Fields MRN: [id number] DOB: 11/23/1955 Today's Date: 10/04/2014    History of present illness 59 y.o. F who was taken to [facility] ED early AM hours 2/22 for respiratory distress intubated and transferred to [facility]. 2/26 bradycardia with PEA. Pt with renal failure, shock, encephalopathy, polyneuropathy, CVVHD 2/27-2/29, trach 3/4, gtube 3/7   OT comments  Focus of session on UB exercise at bed level as a precursor to ADL. Pt supported by ventilator during session.  Tolerated well. Pt alert and following commands, occasionally needing repetition or tactile cues. Spoke to case manager about pursuing LTACH further, CM to follow up.  Follow Up Recommendations  LTACH;Supervision/Assistance - 24 hour    Equipment Recommendations       Recommendations for Other Services      Precautions / Restrictions Precautions Precautions: Fall Precaution Comments: trach, gtube, intermittent vent       Mobility Bed Mobility Transfers                      Balance                            ADL                                                Vision                     Perception     Praxis      Cognition   Behavior During Therapy: WFL for tasks assessed/performed Overall Cognitive Status: Difficult to assess          Following Commands: Follows one step commands inconsistently;Follows one step commands with increased time       General Comments: some difficulty following commands consistently during UE exercise    Extremity/Trunk Assessment               Exercises General Exercises - Upper Extremity Shoulder Flexion: AAROM;Both;10 reps;Supine Shoulder Extension: Strengthening;Both;10 reps;Supine Shoulder Horizontal ABduction: AAROM;Both;10 reps;Supine Shoulder Horizontal ADduction: AAROM;Both;10 reps;Supine Elbow Flexion: AROM;Both;10 reps;Supine Elbow  Extension: Both;10 reps;Supine;AROM Wrist Flexion: Both;10 reps;Supine;AROM Wrist Extension: AAROM;Both;10 reps;Supine Digit Composite Flexion: AAROM;Both;10 reps;Supine Composite Extension: AAROM;Both;10 reps;Supine   Shoulder Instructions       General Comments      Pertinent Vitals/ Pain       Pain Assessment: No/denies pain  Home Living                                          Prior Functioning/Environment              Frequency Min 2X/week     Progress Toward Goals  OT Goals(current goals can now be found in the care plan section)  Progress towards OT goals: Progressing toward goals     Plan Discharge plan remains appropriate    Co-evaluation                 End of Session     Activity Tolerance Patient tolerated treatment well   Patient Left in bed;with nursing/sitter in room;with family/visitor present;with call bell/phone within reach   Nurse Communication  (activity tolerance)  Time: 6256-3893 OT Time Calculation (min): 26 min  Charges: OT General Charges $OT Visit: 1 Procedure OT Treatments $Therapeutic Exercise: 23-37 mins  Malka So 10/04/2014, 2:56 PM  [phone]

## 2014-10-04 NOTE — Trach Care Team (Signed)
[place] Progression Note   Patient Details Name: Jamie Fields MRN: [id number] DOB: July 04, 1956 Today's Date: 10/04/2014   Tracheostomy Assessment    Tracheostomy Shiley 6 mm Cuffed (Active)  Status Secured 10/04/2014 12:42 PM  Site Assessment Drainage 10/04/2014 11:00 AM  Site Care Cleansed;Dried 10/04/2014  3:35 AM  Inner Cannula Care Changed/new 10/04/2014 12:28 AM  Ties Assessment Dry;Secure 10/04/2014 12:42 PM  Cuff pressure (cm) 28 cm 10/04/2014 12:28 AM  Emergency Equipment at bedside Yes 10/04/2014 12:42 PM     Care Needs     Respiratory Therapy O2 Device: Ventilator FiO2 (%): 40 % SpO2: 99 % Education:  (not needed at this time) Follow up recommendations:  (will follow for progress) Respiratory barriers to progression:  (continues to need vent support )    Speech Language Pathology  Patient may use Passy-Muir Speech Valve: Intermittently with supervision, During all therapies with supervision PMSV Supervision: Full Follow up Recommendations: Skilled Nursing facility   Physical Therapy PT Recommendation/Assessment: Patient needs continued PT services Follow Up Recommendations: SNF PT equipment: Other (comment) (to be assessed)    Occupational Therapy OT Recommendation/Assessment: Patient needs continued OT Services Follow Up Recommendations: LTACH, Supervision/Assistance - 24 hour    Nutritional Patient's Current Diet: Tube feeding Tube Feeding: Vital AF 1.2 Cal Tube Feeding Frequency: Continuous Tube Feeding Strength: Full strength    Case Management/Social Work Level of patient care prior to hospitalization: Home-Self care Living status: Alone Insurance payer: Medicare Barriers to progression:  (had to be put back on vent on 3/9) Plan to address barriers:  (attempt to wean again) Anticipated discharge disposition: SNF/Assisted  living facility    Provider [place] Team/Provider Recommendations [place] Team Members Present-   Alvino Blood, SW,   [place], SLP  Marni Griffon, NP Will need to be changed to uncuffed trach before transfer to SNF and on trach collar for 48 hrs before discharge.           Langford Carias, Jaci Carrel (scribe for team) 10/04/2014, 2:19 PM

## 2014-10-05 ENCOUNTER — Inpatient Hospital Stay (HOSPITAL_COMMUNITY): Payer: Commercial Managed Care - HMO

## 2014-10-05 DIAGNOSIS — G8250 Quadriplegia, unspecified: Secondary | ICD-10-CM | POA: Insufficient documentation

## 2014-10-05 DIAGNOSIS — Z930 Tracheostomy status: Secondary | ICD-10-CM

## 2014-10-05 LAB — CBC WITH DIFFERENTIAL/PLATELET
BASOS ABS: 0 10*3/uL (ref 0.0–0.1)
BASOS PCT: 0 % (ref 0–1)
EOS ABS: 0.1 10*3/uL (ref 0.0–0.7)
EOS PCT: 2 % (ref 0–5)
HCT: 23.8 % — ABNORMAL LOW (ref 36.0–46.0)
HEMOGLOBIN: 7.2 g/dL — AB (ref 12.0–15.0)
Lymphocytes Relative: 22 % (ref 12–46)
Lymphs Abs: 1.1 10*3/uL (ref 0.7–4.0)
MCH: 28.3 pg (ref 26.0–34.0)
MCHC: 30.3 g/dL (ref 30.0–36.0)
MCV: 93.7 fL (ref 78.0–100.0)
Monocytes Absolute: 0.3 10*3/uL (ref 0.1–1.0)
Monocytes Relative: 6 % (ref 3–12)
Neutro Abs: 3.6 10*3/uL (ref 1.7–7.7)
Neutrophils Relative %: 70 % (ref 43–77)
Platelets: 330 10*3/uL (ref 150–400)
RBC: 2.54 MIL/uL — ABNORMAL LOW (ref 3.87–5.11)
RDW: 20.6 % — AB (ref 11.5–15.5)
WBC: 5.2 10*3/uL (ref 4.0–10.5)

## 2014-10-05 LAB — BASIC METABOLIC PANEL
Anion gap: 7 (ref 5–15)
BUN: 55 mg/dL — AB (ref 6–23)
CO2: 30 mmol/L (ref 19–32)
CREATININE: 1.03 mg/dL (ref 0.50–1.10)
Calcium: 9.5 mg/dL (ref 8.4–10.5)
Chloride: 112 mmol/L (ref 96–112)
GFR calc non Af Amer: 58 mL/min — ABNORMAL LOW (ref >90)
GFR, EST AFRICAN AMERICAN: 68 mL/min — AB (ref >90)
Glucose, Bld: 146 mg/dL — ABNORMAL HIGH (ref 70–99)
Potassium: 3.4 mmol/L — ABNORMAL LOW (ref 3.5–5.1)
Sodium: 149 mmol/L — ABNORMAL HIGH (ref 135–145)

## 2014-10-05 LAB — PHOSPHORUS: Phosphorus: 3.5 mg/dL (ref 2.3–4.6)

## 2014-10-05 LAB — URINE CULTURE

## 2014-10-05 LAB — GLUCOSE, CAPILLARY
GLUCOSE-CAPILLARY: 141 mg/dL — AB (ref 70–99)
Glucose-Capillary: 138 mg/dL — ABNORMAL HIGH (ref 70–99)
Glucose-Capillary: 142 mg/dL — ABNORMAL HIGH (ref 70–99)
Glucose-Capillary: 139 mg/dL — ABNORMAL HIGH (ref 70–99)
Glucose-Capillary: 134 mg/dL — ABNORMAL HIGH (ref 70–99)
Glucose-Capillary: 120 mg/dL — ABNORMAL HIGH (ref 70–99)

## 2014-10-05 LAB — MAGNESIUM: Magnesium: 2.1 mg/dL (ref 1.5–2.5)

## 2014-10-05 MED ORDER — POTASSIUM CHLORIDE 20 MEQ/15ML (10%) PO SOLN
40.0000 meq | Freq: Once | ORAL | Status: AC
  Administered 2014-10-05: 40 meq
  Filled 2014-10-05 (×2): qty 30

## 2014-10-05 NOTE — Progress Notes (Signed)
Speech Language Pathology Treatment: [name] Speaking valve  Patient Details Name: Jamie Fields MRN: [id number] DOB: 31-Dec-1955 Today's Date: 10/05/2014 Time: 1050-1110 SLP Time Calculation (min) (ACUTE ONLY): 20 min  Assessment / Plan / Recommendation Clinical Impression  Pt demonstrates adequate tolerance of PMSV, though fatigue over 20 minute period led to increased respiratory rate by end of session. No CO2 trapping, excellent redirection of air to upper airway. Moderate verbal cues needed for increased breath support for audible volume at work and phrase level. Pt able to wear PMSV with full supervision with RN or PT/OT during therapy for 10-20 minute intervals. See swallow eval for further instructions.    HPI HPI: 59 y.o. F who was taken to [facility] ED early AM hours 2/22 for respiratory distress intubated and transferred to [facility]. 2/26 bradycardia with PEA. Pt with renal failure, shock, encephalopathy, polyneuropathy, CVVHD 2/27-2/29, ETT 2/22-3/04; trach 3/4; Gtube 3/7.     Pertinent Vitals    SLP Plan  Continue with current plan of care    Recommendations        Patient may use Passy-Muir Speech Valve: Intermittently with supervision;During all therapies with supervision;During PO intake/meals PMSV Supervision: Full       Oral Care Recommendations: Oral care Q4 per protocol Follow up Recommendations: Skilled Nursing facility Plan: Continue with current plan of care    GO    [facility], MA CCC-SLP [phone]  Lynann Beaver 10/05/2014, 12:30 PM

## 2014-10-05 NOTE — Progress Notes (Signed)
PULMONARY / CRITICAL CARE MEDICINE   Name: Jamie Fields MRN: [id number] DOB: 1955-11-12    ADMISSION DATE:  09/17/2014 CONSULTATION DATE:  09/17/2014   REFERRING MD :  [facility]  CHIEF COMPLAINT:  Respiratory Distress  INITIAL PRESENTATION:  59 y.o. F who was taken to [facility] ED early AM hours 2/22 for respiratory distress.  She was admitted and placed on BiPAP.  Was also found to have acute renal failure, AG metabolic acidosis, Overnight, her acidosis and respiratory status worsened to the point she required intubation.  She was also placed on levophed for persistent shock.  Later in day 2/22 was transferred to [facility] for further management.  STUDIES:  CT Chest 2/22 >> no acute process CT abd / pelv 2/22 >> iliac lymph nodes likely reactive.  No acute process CXR 2/23  >> Persistent mild vascular congestion and mild interstitial edema bilaterally. Superimposed infiltrate right upper lobe and left base cannot be excluded. Echo 5/78 >> Systolic function was normal. EF 60% to 65%. Right Tib/Fib DG >> No evidence of osteo; Mild soft tissue swelling RUQ [place] 2/23 >> Liver echogenicity is diffusely increased and somewhat heterogeneous. Most likely are indicative of hepatic steatosis CT tib/fib Rt >> Negative for osteomyelitis or necrotizing fasciitis. Superficial edema and blistering CXR 2/28 >> No change in bilateral airspace disease opacities representing multifocal pneumonia versus asymmetric edema 3/1 ct head >>> neg 3/1 eeg >> slow, enceph 3/2 MRI brain >>> neg acute, sulci flare, may be related to high fio2 3/3 awake, but not moving extremities 3/3 mri neck - neg   SIGNIFICANT EVENTS: 2/22  Admitted at [place]; respiratory failure-intubated; shock; transferred to [facility] 2/23  On [place]; intubated; Fever, CXR infiltrate = sepsis 2/24  Group A strep, added [place]; CT neg for nec fas; Tube feeds started 2/26  Hypoxia, brady arrest/ PEA in pm  2/27  CVVHD initiated  2/28  Pt  net neg 6L; New CXR infiltrate - Vanc restarted 2/29- cvvhd off, urine output increased 3/4 Trach  3/5 -1 u PRBC  3/7 - Gtube placed 3/8 - transfer to SDU 3/9- failed TC, back to vent, neg 1.4 liters  SUBJECTIVE:  Tolerating ATC daytime Afebrile Good UO    VITAL SIGNS: Temp:  [98.7 F (37.1 C)-99.8 F (37.7 C)] 98.7 F (37.1 C) (03/11 0800) Pulse Rate:  [82-103] 103 (03/11 0931) Resp:  [18-37] 26 (03/11 1100) BP: (118-175)/(44-78) 153/70 mmHg (03/11 1100) SpO2:  [94 %-100 %] 98 % (03/11 1100) FiO2 (%):  [35 %-40 %] 35 % (03/11 0931) Weight:  [100.3 kg (221 lb 1.9 oz)] 100.3 kg (221 lb 1.9 oz) (03/11 0306)   INTAKE / OUTPUT:  Intake/Output Summary (Last 24 hours) at 10/05/14 1125 Last data filed at 10/05/14 1100  Gross per 24 hour  Intake   3340 ml  Output   2625 ml  Net    715 ml   PHYSICAL EXAMINATION: General: Obese white female, NAD Neuro: PERRL HEENT: Trach site clean Cardiovascular: RRR, no murmur, gallops, rubs.  Lungs: Lungs rhonchi.  Abdomen: Soft, non-tender, non-distended. BS +. G-tube site clean. Musculoskeletal: No gross deformities. +2 pitting edema.  Skin: Generally intact.   LABS:  CBC  Recent Labs Lab 10/03/14 0423 10/04/14 1120 10/05/14 0346  WBC 7.3 5.1 5.2  HGB 8.2* 7.0* 7.2*  HCT 25.2* 23.1* 23.8*  PLT 458* 314 330   Coag's  Recent Labs Lab 10/01/14 0500  INR 1.02   BMET  Recent Labs Lab 10/03/14  0423 10/04/14 0648 10/05/14 0346  NA 146* 147* 149*  K 3.5 3.3* 3.4*  CL 106 113* 112  CO2 32 29 30  BUN 61* 63* 55*  CREATININE 1.17* 1.25* 1.03  GLUCOSE 166* 154* 146*   Electrolytes  Recent Labs Lab 09/29/14 0451  10/01/14 0500 10/02/14 0416 10/03/14 0423 10/04/14 0648 10/05/14 0346  CALCIUM 8.3*  < > 8.6 9.0 10.0 9.2 9.5  MG 1.6  < > 2.1 1.9  --   --  2.1  PHOS 4.6  --   --   --   --   --  3.5  < > = values in this interval not displayed.   Liver Enzymes  Recent Labs Lab 09/29/14 0451 10/04/14 0648   AST  --  127*  ALT  --  126*  ALKPHOS  --  280*  BILITOT  --  0.7  ALBUMIN 1.6* 2.0*    Glucose  Recent Labs Lab 10/04/14 1158 10/04/14 1637 10/04/14 1955 10/04/14 2330 10/05/14 0331 10/05/14 0809  GLUCAP 125* 144* 140* 141* 138* 142*    Imaging Dg Chest Port 1 View  10/04/2014   CLINICAL DATA:  ARDS.  EXAM: PORTABLE CHEST - 1 VIEW  COMPARISON:  10/03/2014; 10/01/2014;  FINDINGS: Grossly unchanged enlarged cardiac silhouette and mediastinal contours given persistently reduced lung volumes. Pulmonary vasculature appears less distinct than present examination with cephalization of flow. Worsening bibasilar heterogeneous opacities, left greater than right. Trace bilateral effusion not excluded. Stable position of support apparatus. No pneumothorax. Unchanged bones.  IMPRESSION: 1.  Stable positioning of support apparatus.  No pneumothorax. 2. Findings suggestive of worsening pulmonary edema and bibasilar opacities, atelectasis versus infiltrate. Continued attention on follow-up is recommended.   Electronically Signed   By: Sandi Mariscal M.D.   On: 10/04/2014 09:14     ASSESSMENT / PLAN:  PULMONARY ETT 2/22 >>> 3/04 Trach (DF) 3/04 >>> A: Acute hypoxic respiratory failure 2/2 to PNA, sepsis s/p trach 3/4.  COPD without evidence of exacerbation. ARDS >> resolved.  P:   Maintain vent support noctunrla mandatory Goal  trach collar daytime   CARDIOVASCULAR CVL R fem 2/22 >> 2/23 CVL LIJ 2/23 >> 3/8  A:  Shock, Septic/Hypovolemic. Remains off pressors since 3/5 HTN Adrenal Insufficiency >> resolved. Normal ECHO 09/18/14 P:  Continue Labetalol 200 mg bid per tube Labetalol IV prn, dc as not needed   RENAL A:   Mixed metabolic acidosis 2/2 lactic acidosis/acute renal failure. Resolved.  Acute renal failure; resolved.  CKD stage 2; Cr at baseline (1.1) Rhabdo; resolved.  Pseudohypocalcemia; resolved.  Hypernatremia;  P:   Free water Increase d5w Dc lasix k supp    GASTROINTESTINAL A:  Transaminitis; resolved. GERD Obesity Severe protein calorie malnutrition Diarrhea; C. Diff NEG P:   Protonix Tube feeds Imodium prn not needed Advance PO per speech  HEMATOLOGIC A:   Anemia of Chronic Disease; s/p PRBC x 1 on 3/3, 3/5. Hb improving DVT/PE PPx Thrombocytopenia; resolved, now thrombocytosis, likely reactive (458K) P:  Heparin [place]/SCD's  INFECTIOUS A:   GAS Cellulitis/Pneumonia w/ Septic Shock >> completed Abx 3/07 3/10 fevers, UA concerning P:   Resp sputum >>> RARE GROUP A STREP (S.PYOGENES) ISOLATED  Fevers, added ceftaz 3/10 >> Await cx data  ENDOCRINE A:   Hyperglycemia P:   SSI while on tube feeds  NEUROLOGIC A:   Acute metabolic encephalopathy; resolved. H/o Depression, Anxiety, Chronic Pain. Functional quadriplegia s/p critical illness/critical care polyneuropathy; improving. MRI Brain/cervical spine 3/7 w/ no  acute findings P:   Neuro following; consider EMG as an outpatient.  PT/OT   Summary - needs vent SNF, may qualify for LTAC after Monday (21 ds onv ent)  The patient is critically ill with multiple organ systems failure and requires high complexity decision making for assessment and support, frequent evaluation and titration of therapies, application of advanced monitoring technologies and extensive interpretation of multiple databases. Critical Care Time devoted to patient care services described in this note independent of APP time is 31 minutes.    Kara Mead MD. Shade Flood. [place] Pager [phone] If no response call [phone]   10/05/2014 11:25 AM

## 2014-10-05 NOTE — Progress Notes (Signed)
CSW (Clinical Education officer, museum) continues to follow case awaiting further medical progression before continuing SNF talk with pt family. CSW made aware by [facility] that plan may be to pursue LTACH next week if appropriate.    [place], [place]

## 2014-10-05 NOTE — Progress Notes (Signed)
NEURO HOSPITALIST PROGRESS NOTE   SUBJECTIVE:                                                                                                                        She is awake, looks comfortable, follows commands. Improved movements lower extremities. Repeat MRI brain 3/6 showed no acute abnormality. MRI cervical spine unrevealing. OBJECTIVE:                                                                                                                           Vital signs in last 24 hours: Temp:  [98.7 F (37.1 C)-99.8 F (37.7 C)] 98.7 F (37.1 C) (03/11 0800) Pulse Rate:  [82-93] 89 (03/11 0431) Resp:  [18-35] 25 (03/11 0800) BP: (118-175)/(44-78) 141/50 mmHg (03/11 0800) SpO2:  [94 %-100 %] 99 % (03/11 0800) FiO2 (%):  [40 %] 40 % (03/11 0431) Weight:  [100.3 kg (221 lb 1.9 oz)] 100.3 kg (221 lb 1.9 oz) (03/11 0306)  Intake/Output from previous day: 03/10 0701 - 03/11 0700 In: 3110 [I.V.:1000; NG/GT:1860; IV Piggyback:150] Out: 3000 [Urine:3000] Intake/Output this shift:   Nutritional status: Diet NPO time specified  Past Medical History  Diagnosis Date  . COPD (chronic obstructive pulmonary disease)   . GERD (gastroesophageal reflux disease)   . Asthma   . HTN (hypertension)   . Depression   . Anxiety   Physical exam: no apparent distress. Head: normocephalic. Neck: supple, no bruits, no JVD. Cardiac: no murmurs. Lungs: clear. Abdomen: soft, no tender, no mass. Extremities: edema upper and lower extremities. Skin: no rash   Neurologic Exam:  Mental status: open eyes and is able to follow commands. CN 2-12: pupils 4 mm bilaterally reactive. EOM full without nystagmus. Face symmetric. Tongue midline. Motor: very weak grip, can not move upper ext proximally. Moving lower on command. Sensory" light touch intact. DTR's: knee jerk 2+, trace biceps reflex. Plantars: downgoing. Coordination and gait: unable to test  Lab  Results: No results found for: CHOL Lipid Panel No results for input(s): CHOL, TRIG, HDL, CHOLHDL, VLDL, LDLCALC in the last 72 hours.  Studies/Results: Dg Chest Port 1 View  10/05/2014   CLINICAL DATA:  COPD.  EXAM: PORTABLE CHEST - 1 VIEW  COMPARISON:  10/04/2014.  FINDINGS: Tracheostomy tube in stable position. There near complete clearing of bibasilar pulmonary edema and/or infiltrates. Mild residual. No significant pleural effusion. No pneumothorax. Heart size stable . No acute osseus abnormality.  IMPRESSION: 1. Interim near complete clearing of bibasilar pulmonary edema and/or infiltrates. Mild residual, particularly in the left lung base. 2. Tracheostomy tube in stable position.   Electronically Signed   By: Marcello Moores  Register   On: 10/05/2014 07:25   Dg Chest Port 1 View  10/04/2014   CLINICAL DATA:  ARDS.  EXAM: PORTABLE CHEST - 1 VIEW  COMPARISON:  10/03/2014; 10/01/2014;  FINDINGS: Grossly unchanged enlarged cardiac silhouette and mediastinal contours given persistently reduced lung volumes. Pulmonary vasculature appears less distinct than present examination with cephalization of flow. Worsening bibasilar heterogeneous opacities, left greater than right. Trace bilateral effusion not excluded. Stable position of support apparatus. No pneumothorax. Unchanged bones.  IMPRESSION: 1.  Stable positioning of support apparatus.  No pneumothorax. 2. Findings suggestive of worsening pulmonary edema and bibasilar opacities, atelectasis versus infiltrate. Continued attention on follow-up is recommended.   Electronically Signed   By: Sandi Mariscal M.D.   On: 10/04/2014 09:14   Dg Chest Port 1 View  10/03/2014   CLINICAL DATA:  Tachypnea  EXAM: PORTABLE CHEST - 1 VIEW  COMPARISON:  One-view chest 10/01/2014  FINDINGS: The heart is enlarged. The tracheostomy tube remains in place. A small left effusion is again noted. Mild edema is slightly improved. Aeration in the right upper lobe is improved.  IMPRESSION:  1. Slight improved aeration in the right upper lobe with some persist mild edema and a left pleural effusion. 2. The tracheostomy is stable.   Electronically Signed   By: San Morelle M.D.   On: 10/03/2014 13:58    MEDICATIONS                                                                                                                        Scheduled: . antiseptic oral rinse  7 mL Mouth Rinse QID  . cefTAZidime (FORTAZ)  IV  1 g Intravenous 3 times per day  . chlorhexidine  15 mL Mouth Rinse BID  . free water  250 mL Per Tube 3 times per day  . heparin subcutaneous  5,000 Units Subcutaneous 3 times per day  . insulin aspart  0-20 Units Subcutaneous 6 times per day  . labetalol  200 mg Per Tube BID  . pantoprazole sodium  40 mg Per Tube Daily    ASSESSMENT/PLAN:  59 yo F with AMS following cardiac arrest. She has markedly improved in mental status, with improved movements lower extremities but still minimal movements arms. Unrevealing MRI brain and cervical spine. Although she has preserved DTR's, I suspect a peripheral process like critical illness polyneuropathy.. EMG/NCS will be helpful but not available in the hospital. Rehab. Neurology will sign off.  Jamie Pod, MD [facility] [phone]  10/05/2014, 9:29 AM

## 2014-10-05 NOTE — Progress Notes (Signed)
Came to check on referral.  Patient was asleep and her son was not in the room at this time.  Spoke with the inpatient RNCM regarding following the patient for progress and potential needs.  She states that they will likely be looking at [facility] verses SNF.  Will continue to follow up.  Natividad Brood, RN, BSN, [facility] Liaison at [phone].

## 2014-10-05 NOTE — Evaluation (Signed)
Clinical/Bedside Swallow Evaluation Patient Details  Name: Jamie Fields MRN: [id number] Date of Birth: 08/13/1955  Today's Date: 10/05/2014 Time: SLP Start Time (ACUTE ONLY): 18 SLP Stop Time (ACUTE ONLY): 1110 SLP Time Calculation (min) (ACUTE ONLY): 20 min  Past Medical History:  Past Medical History  Diagnosis Date  . COPD (chronic obstructive pulmonary disease)   . GERD (gastroesophageal reflux disease)   . Asthma   . HTN (hypertension)   . Depression   . Anxiety    Past Surgical History:  Past Surgical History  Procedure Laterality Date  . Tracheostomy      Jamie Fields   HPI:  59 y.o. F who was taken to [facility] ED early AM hours 2/22 for respiratory distress intubated and transferred to [facility]. 2/26 bradycardia with PEA. Pt with renal failure, shock, encephalopathy, polyneuropathy, CVVHD 2/27-2/29, ETT 2/22-3/04; trach 3/4; Gtube 3/7.     Assessment / Plan / Recommendation Clinical Impression  Pt demonstrates good potential for safe PO intake with repositioning and PMSV in place. Oral function WNL, swallow response appears strong and timely. Coordination of respiration and swallow adequate, though increased RR threatens this balance as pt becomes fatigued. Pt with delayed cough following sips of thin liquids that could be a result of silent aspiration with delayed sensation. Pt should have objective swallow test prior to diet initaition. Recommend consideration of FEES on Monday, hopeful that strength and activity tolerance may improve by that time for more consistent function. For now pt should remian NPO except for ice chips with full supervision from RN with PMSV in place following oral care.     Aspiration Risk  Severe    Diet Recommendation Ice chips PRN after oral care   Medication Administration: Via alternative means    Other  Recommendations Recommended Consults: FEES Oral Care Recommendations: Oral care Q4 per protocol   Follow Up Recommendations  Skilled  Nursing facility    Frequency and Duration min 3x week      Pertinent Vitals/Pain NA    SLP Swallow Goals     Swallow Study Prior Functional Status       General HPI: 59 y.o. F who was taken to [facility] ED early AM hours 2/22 for respiratory distress intubated and transferred to [facility]. 2/26 bradycardia with PEA. Pt with renal failure, shock, encephalopathy, polyneuropathy, CVVHD 2/27-2/29, ETT 2/22-3/04; trach 3/4; Gtube 3/7.   Type of Study: Bedside swallow evaluation Diet Prior to this Study: NPO;PEG tube Temperature Spikes Noted: No Respiratory Status: Trach collar Trach Size and Type: #6;Cuff;Deflated;With PMSV in place History of Recent Intubation: Yes Length of Intubations (days): 12 days Date extubated: 09/28/14 Behavior/Cognition: Alert;Cooperative;Requires cueing Oral Cavity - Dentition: Adequate natural dentition Self-Feeding Abilities: Total assist Patient Positioning: Upright in bed Baseline Vocal Quality: Breathy Volitional Cough: Strong Volitional Swallow: Able to elicit    Oral/Motor/Sensory Function Overall Oral Motor/Sensory Function: Appears within functional limits for tasks assessed   Ice Chips Ice chips: Within functional limits   Thin Liquid Thin Liquid: Impaired Presentation: Cup;Straw Pharyngeal  Phase Impairments: Cough - Delayed    Nectar Thick Nectar Thick Liquid: Not tested   Honey Thick Honey Thick Liquid: Not tested   Puree Puree: Within functional limits   Solid   GO    Solid: Not tested       Sebrina Fields, Jamie Fields 10/05/2014,12:45 PM

## 2014-10-06 LAB — BASIC METABOLIC PANEL
Anion gap: 7 (ref 5–15)
BUN: 48 mg/dL — AB (ref 6–23)
CO2: 27 mmol/L (ref 19–32)
Calcium: 9.5 mg/dL (ref 8.4–10.5)
Chloride: 113 mmol/L — ABNORMAL HIGH (ref 96–112)
Creatinine, Ser: 0.92 mg/dL (ref 0.50–1.10)
GFR calc Af Amer: 77 mL/min — ABNORMAL LOW (ref >90)
GFR calc non Af Amer: 67 mL/min — ABNORMAL LOW (ref >90)
Glucose, Bld: 115 mg/dL — ABNORMAL HIGH (ref 70–99)
Potassium: 3.6 mmol/L (ref 3.5–5.1)
Sodium: 147 mmol/L — ABNORMAL HIGH (ref 135–145)

## 2014-10-06 LAB — CBC
HCT: 24.1 % — ABNORMAL LOW (ref 36.0–46.0)
HEMOGLOBIN: 7.4 g/dL — AB (ref 12.0–15.0)
MCH: 28 pg (ref 26.0–34.0)
MCHC: 30.7 g/dL (ref 30.0–36.0)
MCV: 91.3 fL (ref 78.0–100.0)
PLATELETS: 302 10*3/uL (ref 150–400)
RBC: 2.64 MIL/uL — AB (ref 3.87–5.11)
RDW: 19.9 % — ABNORMAL HIGH (ref 11.5–15.5)
WBC: 4.6 10*3/uL (ref 4.0–10.5)

## 2014-10-06 LAB — GLUCOSE, CAPILLARY
Glucose-Capillary: 111 mg/dL — ABNORMAL HIGH (ref 70–99)
Glucose-Capillary: 117 mg/dL — ABNORMAL HIGH (ref 70–99)
Glucose-Capillary: 124 mg/dL — ABNORMAL HIGH (ref 70–99)
Glucose-Capillary: 132 mg/dL — ABNORMAL HIGH (ref 70–99)
Glucose-Capillary: 154 mg/dL — ABNORMAL HIGH (ref 70–99)
Glucose-Capillary: 156 mg/dL — ABNORMAL HIGH (ref 70–99)

## 2014-10-06 NOTE — Progress Notes (Signed)
PULMONARY / CRITICAL CARE MEDICINE   Name: Jamie Fields MRN: [id number] DOB: 08/12/1955    ADMISSION DATE:  09/17/2014 CONSULTATION DATE:  09/17/2014   REFERRING MD :  [facility]  CHIEF COMPLAINT:  Respiratory Distress  INITIAL PRESENTATION:  59 y.o. F who was taken to [facility] ED early AM hours 2/22 for respiratory distress.  She was admitted and placed on BiPAP.  Was also found to have acute renal failure, AG metabolic acidosis, Overnight, her acidosis and respiratory status worsened to the point she required intubation.  She was also placed on levophed for persistent shock.  Later in day 2/22 was transferred to [facility] for further management.  STUDIES:  CT Chest 2/22 >> no acute process CT abd / pelv 2/22 >> iliac lymph nodes likely reactive.  No acute process CXR 2/23  >> Persistent mild vascular congestion and mild interstitial edema bilaterally. Superimposed infiltrate right upper lobe and left base cannot be excluded. Echo 8/34 >> Systolic function was normal. EF 60% to 65%. Right Tib/Fib DG >> No evidence of osteo; Mild soft tissue swelling RUQ [place] 2/23 >> Liver echogenicity is diffusely increased and somewhat heterogeneous. Most likely are indicative of hepatic steatosis CT tib/fib Rt >> Negative for osteomyelitis or necrotizing fasciitis. Superficial edema and blistering CXR 2/28 >> No change in bilateral airspace disease opacities representing multifocal pneumonia versus asymmetric edema 3/1 ct head >>> neg 3/1 eeg >> slow, enceph 3/2 MRI brain >>> neg acute, sulci flare, may be related to high fio2 3/3 awake, but not moving extremities 3/3 mri neck - neg   SIGNIFICANT EVENTS: 2/22  Admitted at [place]; respiratory failure-intubated; shock; transferred to [facility] 2/23  On [place]; intubated; Fever, CXR infiltrate = sepsis 2/24  Group A strep, added [place]; CT neg for nec fas; Tube feeds started 2/26  Hypoxia, brady arrest/ PEA in pm  2/27  CVVHD initiated  2/28  Pt  net neg 6L; New CXR infiltrate - Vanc restarted 2/29- cvvhd off, urine output increased 3/4 Trach  3/5 -1 u PRBC  3/7 - Gtube placed 3/8 - transfer to SDU 3/9- failed TC, back to vent, neg 1.4 liters  SUBJECTIVE:  Tolerating ATC daytime febrile Good UO    VITAL SIGNS: Temp:  [98.2 F (36.8 C)-102.5 F (39.2 C)] 99.4 F (37.4 C) (03/12 0751) Pulse Rate:  [83-103] 96 (03/12 0825) Resp:  [17-37] 24 (03/12 0825) BP: (127-170)/(54-78) 154/58 mmHg (03/12 0825) SpO2:  [95 %-100 %] 98 % (03/12 0825) FiO2 (%):  [35 %-40 %] 35 % (03/12 0825) Weight:  [97.3 kg (214 lb 8.1 oz)] 97.3 kg (214 lb 8.1 oz) (03/12 0433)   INTAKE / OUTPUT:  Intake/Output Summary (Last 24 hours) at 10/06/14 0927 Last data filed at 10/06/14 1962  Gross per 24 hour  Intake   3380 ml  Output   2290 ml  Net   1090 ml   PHYSICAL EXAMINATION: General: Obese white female, NAD Neuro: PERRL HEENT: Trach site clean Cardiovascular: RRR, no murmur, gallops, rubs.  Lungs: Lungs rhonchi.  Abdomen: Soft, non-tender, non-distended. BS +. G-tube site clean. Musculoskeletal: No gross deformities. +2 pitting edema.  Skin: Generally intact.   LABS:  CBC  Recent Labs Lab 10/04/14 1120 10/05/14 0346 10/06/14 0242  WBC 5.1 5.2 4.6  HGB 7.0* 7.2* 7.4*  HCT 23.1* 23.8* 24.1*  PLT 314 330 302   Coag's  Recent Labs Lab 10/01/14 0500  INR 1.02   BMET  Recent Labs Lab 10/04/14 [phone]  10/05/14 0346 10/06/14 0242  NA 147* 149* 147*  K 3.3* 3.4* 3.6  CL 113* 112 113*  CO2 29 30 27   BUN 63* 55* 48*  CREATININE 1.25* 1.03 0.92  GLUCOSE 154* 146* 115*   Electrolytes  Recent Labs Lab 10/01/14 0500 10/02/14 0416  10/04/14 0648 10/05/14 0346 10/06/14 0242  CALCIUM 8.6 9.0  < > 9.2 9.5 9.5  MG 2.1 1.9  --   --  2.1  --   PHOS  --   --   --   --  3.5  --   < > = values in this interval not displayed.   Liver Enzymes  Recent Labs Lab 10/04/14 0648  AST 127*  ALT 126*  ALKPHOS 280*  BILITOT 0.7   ALBUMIN 2.0*    Glucose  Recent Labs Lab 10/05/14 1155 10/05/14 1624 10/05/14 2031 10/05/14 2357 10/06/14 0435 10/06/14 0751  GLUCAP 139* 134* 120* 156* 111* 154*    Imaging Dg Chest Port 1 View  10/05/2014   CLINICAL DATA:  COPD.  EXAM: PORTABLE CHEST - 1 VIEW  COMPARISON:  10/04/2014.  FINDINGS: Tracheostomy tube in stable position. There near complete clearing of bibasilar pulmonary edema and/or infiltrates. Mild residual. No significant pleural effusion. No pneumothorax. Heart size stable . No acute osseus abnormality.  IMPRESSION: 1. Interim near complete clearing of bibasilar pulmonary edema and/or infiltrates. Mild residual, particularly in the left lung base. 2. Tracheostomy tube in stable position.   Electronically Signed   By: Marcello Moores  Register   On: 10/05/2014 07:25     ASSESSMENT / PLAN:  PULMONARY ETT 2/22 >>> 3/04 Trach (DF) 3/04 >>> A: Acute hypoxic respiratory failure 2/2 to PNA, sepsis s/p trach 3/4.  COPD without evidence of exacerbation. ARDS >> resolved.  P:   Maintain vent support nocturnal mandatory Goal  trach collar daytime   CARDIOVASCULAR CVL R fem 2/22 >> 2/23 CVL LIJ 2/23 >> 3/8  A:  Shock, Septic/Hypovolemic. Remains off pressors since 3/5 HTN Adrenal Insufficiency >> resolved. Normal ECHO 09/18/14 P:  Continue Labetalol 200 mg bid per tube Labetalol IV prn, dc as not needed   RENAL A:   Mixed metabolic acidosis 2/2 lactic acidosis/acute renal failure. Resolved.  Acute renal failure; resolved.  CKD stage 2; Cr at baseline (1.1) Rhabdo; resolved.  Pseudohypocalcemia; resolved.  Hypernatremia;  P:   Free water Ct d5w @ 50/h Dc lasix k supp   GASTROINTESTINAL A:  Transaminitis; resolved. GERD Obesity Severe protein calorie malnutrition Diarrhea; C. Diff NEG P:   Protonix Tube feeds Imodium prn not needed Advance PO per speech  HEMATOLOGIC A:   Anemia of Chronic Disease; s/p PRBC x 1 on 3/3, 3/5. Hb  improving DVT/PE PPx Thrombocytopenia; resolved, now thrombocytosis, likely reactive (458K) P:  Heparin [place]/SCD's  INFECTIOUS A:   GAS Cellulitis/Pneumonia w/ Septic Shock >> completed Abx 3/07 3/10 fevers, UA concerning P:   Resp 2/23 >>> RARE GROUP A STREP (S.PYOGENES) ISOLATED RVP 2/23 >> neg 3/10 urine >>  Fevers, ct ceftaz 3/10 >> Await cx data  ENDOCRINE A:   Hyperglycemia P:   SSI while on tube feeds  NEUROLOGIC A:   Acute metabolic encephalopathy; resolved. H/o Depression, Anxiety, Chronic Pain. Functional quadriplegia s/p critical illness/critical care polyneuropathy; improving. MRI Brain/cervical spine 3/7 w/ no acute findings P:   Neuro following; consider EMG as an outpatient.  PT/OT   Summary - needs vent SNF, may qualify for LTAC after Monday (21 ds onv ent)   Davonna Belling  Elsworth Soho MD. Shade Flood. [facility] Pager [phone] If no response call [phone]   10/06/2014 9:27 AM

## 2014-10-06 NOTE — Progress Notes (Signed)
RT Note: Pt taken off vent, placed on 35% ATC. Pt tolerating well at this time, VS stable. RT will continue to monitor.

## 2014-10-07 LAB — GLUCOSE, CAPILLARY
GLUCOSE-CAPILLARY: 143 mg/dL — AB (ref 70–99)
GLUCOSE-CAPILLARY: 119 mg/dL — AB (ref 70–99)
GLUCOSE-CAPILLARY: 137 mg/dL — AB (ref 70–99)
Glucose-Capillary: 106 mg/dL — ABNORMAL HIGH (ref 70–99)
Glucose-Capillary: 146 mg/dL — ABNORMAL HIGH (ref 70–99)
Glucose-Capillary: 159 mg/dL — ABNORMAL HIGH (ref 70–99)

## 2014-10-07 LAB — CBC
HCT: 22.8 % — ABNORMAL LOW (ref 36.0–46.0)
HEMATOCRIT: 21.7 % — AB (ref 36.0–46.0)
Hemoglobin: 6.9 g/dL — CL (ref 12.0–15.0)
Hemoglobin: 7.1 g/dL — ABNORMAL LOW (ref 12.0–15.0)
MCH: 27.8 pg (ref 26.0–34.0)
MCH: 28.2 pg (ref 26.0–34.0)
MCHC: 31.8 g/dL (ref 30.0–36.0)
MCHC: 31.1 g/dL (ref 30.0–36.0)
MCV: 87.5 fL (ref 78.0–100.0)
MCV: 90.5 fL (ref 78.0–100.0)
PLATELETS: 154 10*3/uL (ref 150–400)
Platelets: 259 10*3/uL (ref 150–400)
RBC: 2.48 MIL/uL — ABNORMAL LOW (ref 3.87–5.11)
RBC: 2.52 MIL/uL — AB (ref 3.87–5.11)
RDW: 20.9 % — AB (ref 11.5–15.5)
RDW: 19.1 % — AB (ref 11.5–15.5)
WBC: 10.5 10*3/uL (ref 4.0–10.5)
WBC: 4.2 10*3/uL (ref 4.0–10.5)

## 2014-10-07 LAB — BASIC METABOLIC PANEL
ANION GAP: 6 (ref 5–15)
BUN: 42 mg/dL — AB (ref 6–23)
CO2: 26 mmol/L (ref 19–32)
Calcium: 9.4 mg/dL (ref 8.4–10.5)
Chloride: 111 mmol/L (ref 96–112)
Creatinine, Ser: 0.85 mg/dL (ref 0.50–1.10)
GFR calc Af Amer: 85 mL/min — ABNORMAL LOW (ref >90)
GFR calc non Af Amer: 74 mL/min — ABNORMAL LOW (ref >90)
GLUCOSE: 146 mg/dL — AB (ref 70–99)
POTASSIUM: 3.2 mmol/L — AB (ref 3.5–5.1)
Sodium: 143 mmol/L (ref 135–145)

## 2014-10-07 LAB — CLOSTRIDIUM DIFFICILE BY PCR: CDIFFPCR: NEGATIVE

## 2014-10-07 MED ORDER — ONDANSETRON HCL 4 MG/2ML IJ SOLN
4.0000 mg | Freq: Three times a day (TID) | INTRAMUSCULAR | Status: DC | PRN
  Administered 2014-10-07: 4 mg via INTRAVENOUS
  Filled 2014-10-07: qty 2

## 2014-10-07 MED ORDER — ONDANSETRON 8 MG/NS 50 ML IVPB: 8.0000 mg | Freq: Three times a day (TID) | INTRAVENOUS | Status: DC | PRN

## 2014-10-07 MED ORDER — POTASSIUM CHLORIDE 10 MEQ/100ML IV SOLN
10.0000 meq | INTRAVENOUS | Status: AC
  Administered 2014-10-07 (×3): 10 meq via INTRAVENOUS
  Filled 2014-10-07 (×3): qty 100

## 2014-10-07 MED ORDER — POTASSIUM CHLORIDE 10 MEQ/50ML IV SOLN: 10.0000 meq | INTRAVENOUS | Status: DC

## 2014-10-07 NOTE — Progress Notes (Signed)
ANTIBIOTIC CONSULT NOTE - FOLLOW UP  Pharmacy Consult for Ceftazidime Indication: UTI  No Known Allergies  Patient Measurements: Height: 5\' 7"  (170.2 cm) Weight: 215 lb 13.3 oz (97.9 kg) IBW/kg (Calculated) : 61.6  Vital Signs: Temp: 98.3 F (36.8 C) (03/13 1251) Temp Source: Oral (03/13 1251) BP: 147/58 mmHg (03/13 1251) Pulse Rate: 89 (03/13 1515) Intake/Output from previous day: 03/12 0701 - 03/13 0700 In: 3780 [I.V.:1200; NG/GT:2430; IV Piggyback:150] Out: 1945 [ZSWFU:9323] Intake/Output from this shift: Total I/O In: 630 [I.V.:100; NG/GT:280; IV Piggyback:250] Out: 140 [Urine:140]  Labs:  Recent Labs  10/05/14 0346 10/06/14 0242 10/07/14 0400  WBC 5.2 4.6 4.2  HGB 7.2* 7.4* 7.1*  PLT 330 302 259  CREATININE 1.03 0.92 0.85   Estimated Creatinine Clearance: 85.6 mL/min (by C-G formula based on Cr of 0.85).  Assessment: 59yo female known to pharmacy from previous antibiotic dosing with fevers and a concerning UA. She resumed ceftazidime for probable UTI on 3/10. Pt now afebrile, WBC WNL.  Unasyn 2/26 >> 3/2 Clinda 2/24 >> 2/26 Levaquin x1 2/24 Tamiflu 2/23 >> 2/24 Zosyn 2/23 >> 2/26 Vanc 2/23 >> 2/26; 2/28 >> 2/29; 3/ 2>> 3/4 Tressie Ellis 3/2 >> 3/7, resume 3/10>>  3/10 Urine - multiple bacteria, none predominant, recollect? 3/2 BCx x2 - negative 2/29 C.diff - negative 2/28 endotracheal cx - negative 2/23 and 2/24 BCx x2 - negative 2/23 RVP - negative 2/23 Urine - negative 2/23 TA - few Candida albicans; rare GAS 2/22 MRSA PCR - neg [place] blood - 1/2 GAS (do not conduct sensitivities)  Plan:  - Continue Ceftazidime 1g IV q8 - F/U recollect urine culture vs DC abx  Drucie Opitz, PharmD Clinical Pharmacy Resident Pager: [phone] 10/07/2014 3:45 PM

## 2014-10-07 NOTE — Progress Notes (Signed)
RT Note: Pt taken off vent, placed on 35% ATC. Pt tolerating well, RT to monitor.

## 2014-10-07 NOTE — Progress Notes (Signed)
PULMONARY / CRITICAL CARE MEDICINE   Name: Jamie Fields MRN: [id number] DOB: [birth year]    ADMISSION DATE:  09/17/2014 CONSULTATION DATE:  09/17/2014   REFERRING MD :  [facility]  CHIEF COMPLAINT:  Respiratory Distress  INITIAL PRESENTATION:  59 y.o. F who was taken to [facility] ED early AM hours 2/22 for respiratory distress.  She was admitted and placed on BiPAP.  Was also found to have acute renal failure, AG metabolic acidosis, Overnight, her acidosis and respiratory status worsened to the point she required intubation.  She was also placed on levophed for persistent shock.  Later in day 2/22 was transferred to [facility] for further management.  STUDIES:  CT Chest 2/22 >> no acute process CT abd / pelv 2/22 >> iliac lymph nodes likely reactive.  No acute process CXR 2/23  >> Persistent mild vascular congestion and mild interstitial edema bilaterally. Superimposed infiltrate right upper lobe and left base cannot be excluded. Echo 0/30 >> Systolic function was normal. EF 60% to 65%. Right Tib/Fib DG >> No evidence of osteo; Mild soft tissue swelling RUQ [place] 2/23 >> Liver echogenicity is diffusely increased and somewhat heterogeneous. Most likely are indicative of hepatic steatosis CT tib/fib Rt >> Negative for osteomyelitis or necrotizing fasciitis. Superficial edema and blistering CXR 2/28 >> No change in bilateral airspace disease opacities representing multifocal pneumonia versus asymmetric edema 3/1 ct head >>> neg 3/1 eeg >> slow, enceph 3/2 MRI brain >>> neg acute, sulci flare, may be related to high fio2 3/3 awake, but not moving extremities 3/3 mri neck - neg   SIGNIFICANT EVENTS: 2/22  Admitted at [place]; respiratory failure-intubated; shock; transferred to [facility] 2/23  On [place]; intubated; Fever, CXR infiltrate = sepsis 2/24  Group A strep, added Clinda; CT neg for nec fas; Tube feeds started 2/26  Hypoxia, brady arrest/ PEA in pm  2/27  CVVHD initiated  2/28  Pt  net neg 6L; New CXR infiltrate - Vanc restarted 2/29- cvvhd off, urine output increased 3/4 Trach  3/5 -1 u PRBC  3/7 - Gtube placed 3/8 - transfer to SDU 3/9- failed TC, back to vent, neg 1.4 liters  SUBJECTIVE:  Tolerating ATC daytime Low gr febrile C/o nausea this am Good UO    VITAL SIGNS: Temp:  [95.8 F (35.4 C)-100 F (37.8 C)] 100 F (37.8 C) (03/13 0836) Pulse Rate:  [67-97] 79 (03/13 0836) Resp:  [11-32] 15 (03/13 0836) BP: (106-168)/(38-74) 149/74 mmHg (03/13 0836) SpO2:  [99 %-100 %] 100 % (03/13 0836) FiO2 (%):  [35 %-40 %] 40 % (03/13 0836) Weight:  [97.9 kg (215 lb 13.3 oz)] 97.9 kg (215 lb 13.3 oz) (03/13 0417)   INTAKE / OUTPUT:  Intake/Output Summary (Last 24 hours) at 10/07/14 0852 Last data filed at 10/07/14 0800  Gross per 24 hour  Intake   3540 ml  Output   1955 ml  Net   1585 ml   PHYSICAL EXAMINATION: General: Obese white female, NAD Neuro: PERRL HEENT: Trach site clean Cardiovascular: RRR, no murmur, gallops, rubs.  Lungs: Lungs rhonchi.  Abdomen: Soft, non-tender, non-distended. BS +. G-tube site clean. Musculoskeletal: No gross deformities. +2 pitting edema.  Skin: Generally intact.   LABS:  CBC  Recent Labs Lab 10/05/14 0346 10/06/14 0242 10/07/14 0400  WBC 5.2 4.6 4.2  HGB 7.2* 7.4* 7.1*  HCT 23.8* 24.1* 22.8*  PLT 330 302 259   Coag's  Recent Labs Lab 10/01/14 0500  INR 1.02   BMET  Recent Labs Lab 10/05/14 0346 10/06/14 0242 10/07/14 0400  NA 149* 147* 143  K 3.4* 3.6 3.2*  CL 112 113* 111  CO2 30 27 26   BUN 55* 48* 42*  CREATININE 1.03 0.92 0.85  GLUCOSE 146* 115* 146*   Electrolytes  Recent Labs Lab 10/01/14 0500 10/02/14 0416  10/05/14 0346 10/06/14 0242 10/07/14 0400  CALCIUM 8.6 9.0  < > 9.5 9.5 9.4  MG 2.1 1.9  --  2.1  --   --   PHOS  --   --   --  3.5  --   --   < > = values in this interval not displayed.   Liver Enzymes  Recent Labs Lab 10/04/14 0648  AST 127*  ALT 126*   ALKPHOS 280*  BILITOT 0.7  ALBUMIN 2.0*    Glucose  Recent Labs Lab 10/06/14 1158 10/06/14 1637 10/06/14 1948 10/07/14 0006 10/07/14 0353 10/07/14 0825  GLUCAP 132* 124* 117* 146* 143* 119*    Imaging No results found.   ASSESSMENT / PLAN:  PULMONARY ETT 2/22 >>> 3/04 Trach (DF) 3/04 >>> A: Acute hypoxic respiratory failure 2/2 to PNA, sepsis s/p trach 3/4.  COPD without evidence of exacerbation. ARDS >> resolved.  P:   She may be able to do without  vent support nocturnal -once weakness improves Goal  trach collar 24h     CARDIOVASCULAR CVL R fem 2/22 >> 2/23 CVL LIJ 2/23 >> 3/8  A:  Shock, Septic/Hypovolemic. Remains off pressors since 3/5 HTN Adrenal Insufficiency >> resolved. Normal ECHO 09/18/14 P:  Continue Labetalol 200 mg bid per tube   RENAL A:   Mixed metabolic acidosis 2/2 lactic acidosis/acute renal failure. Resolved.  Acute renal failure; resolved.  CKD stage 2; Cr at baseline (1.1) Rhabdo; resolved.  Pseudohypocalcemia; resolved.  Hypernatremia;  P:   Free water Dc d5w  Dc lasix k supp   GASTROINTESTINAL A:  Transaminitis; resolved. GERD Obesity Severe protein calorie malnutrition Diarrhea; C. Diff NEG P:   Protonix Tube feeds Advance PO per speech  HEMATOLOGIC A:   Anemia of Chronic Disease; s/p PRBC x 1 on 3/3, 3/5. Hb improving DVT/PE PPx Thrombocytopenia; resolved, now thrombocytosis, likely reactive (458K) P:  Heparin [place]/SCD's  INFECTIOUS A:   GAS Cellulitis/Pneumonia w/ Septic Shock >> completed Abx 3/07 3/10 fevers, UA concerning P:   Resp 2/23 >>> RARE GROUP A STREP (S.PYOGENES) ISOLATED RVP 2/23 >> neg 3/10 urine >>  Fevers, ct ceftaz 3/10 >> Await cx data  ENDOCRINE A:   Hyperglycemia P:   SSI while on tube feeds  NEUROLOGIC A:   Acute metabolic encephalopathy; resolved. H/o Depression, Anxiety, Chronic Pain. Functional quadriplegia s/p critical illness/critical care polyneuropathy;  improving. MRI Brain/cervical spine 3/7 w/ no acute findings P:   Neuro following; consider EMG as an outpatient.  PT/OT   Summary - needs vent SNF, may qualify for LTAC after Monday (21 ds onv ent)   Kara Mead MD. FCCP. [facility] Pager [phone] If no response call [phone]   10/07/2014 8:52 AM

## 2014-10-08 LAB — BASIC METABOLIC PANEL
Anion gap: 6 (ref 5–15)
BUN: 41 mg/dL — AB (ref 6–23)
CALCIUM: 9.6 mg/dL (ref 8.4–10.5)
CO2: 26 mmol/L (ref 19–32)
CREATININE: 0.83 mg/dL (ref 0.50–1.10)
Chloride: 110 mmol/L (ref 96–112)
GFR calc Af Amer: 88 mL/min — ABNORMAL LOW (ref >90)
GFR, EST NON AFRICAN AMERICAN: 76 mL/min — AB (ref >90)
GLUCOSE: 133 mg/dL — AB (ref 70–99)
Potassium: 3.5 mmol/L (ref 3.5–5.1)
Sodium: 142 mmol/L (ref 135–145)

## 2014-10-08 LAB — GLUCOSE, CAPILLARY
GLUCOSE-CAPILLARY: 131 mg/dL — AB (ref 70–99)
GLUCOSE-CAPILLARY: 126 mg/dL — AB (ref 70–99)
Glucose-Capillary: 114 mg/dL — ABNORMAL HIGH (ref 70–99)
Glucose-Capillary: 116 mg/dL — ABNORMAL HIGH (ref 70–99)
Glucose-Capillary: 120 mg/dL — ABNORMAL HIGH (ref 70–99)
Glucose-Capillary: 125 mg/dL — ABNORMAL HIGH (ref 70–99)
Glucose-Capillary: 129 mg/dL — ABNORMAL HIGH (ref 70–99)

## 2014-10-08 LAB — CBC
HEMATOCRIT: 23.1 % — AB (ref 36.0–46.0)
Hemoglobin: 7.3 g/dL — ABNORMAL LOW (ref 12.0–15.0)
MCH: 28.5 pg (ref 26.0–34.0)
MCHC: 31.6 g/dL (ref 30.0–36.0)
MCV: 90.2 fL (ref 78.0–100.0)
Platelets: 252 10*3/uL (ref 150–400)
RBC: 2.56 MIL/uL — ABNORMAL LOW (ref 3.87–5.11)
RDW: 18.6 % — ABNORMAL HIGH (ref 11.5–15.5)
WBC: 4.6 10*3/uL (ref 4.0–10.5)

## 2014-10-08 MED ORDER — LABETALOL HCL 200 MG PO TABS
200.0000 mg | ORAL_TABLET | Freq: Three times a day (TID) | ORAL | Status: DC
  Administered 2014-10-08 – 2014-10-11 (×9): 200 mg
  Filled 2014-10-08 (×11): qty 1

## 2014-10-08 NOTE — Progress Notes (Signed)
Physical Therapy Treatment Patient Details Name: Jamie Fields MRN: [id number] DOB: [birth year] Today's Date: November 02, 2014    History of Present Illness 59 y.o. F who was taken to [facility] ED early AM hours 2/22 for respiratory distress intubated and transferred to [facility]. 2/26 bradycardia with PEA. Pt with renal failure, shock, encephalopathy, polyneuropathy, CVVHD 2/27-2/29, trach 3/4, gtube 3/7    PT Comments    Pt with improving strength. Feel if pt could go to [facility] she would progress enough to be appropriate for CIR.  Follow Up Recommendations  LTACH     Equipment Recommendations  Other (comment) (to be assessed)    Recommendations for Other Services       Precautions / Restrictions Precautions Precautions: Fall Precaution Comments: trach, gtube, intermittent vent    Mobility  Bed Mobility Overal bed mobility: Needs Assistance   Rolling: Max assist         General bed mobility comments: Pt able to assist by bending up opposite knee, turning head, and trying to reach across with opposite arm.  Transfers                 General transfer comment: Used maxisky for bed to chair.  Ambulation/Gait                 Stairs            Wheelchair Mobility    Modified Rankin (Stroke Patients Only)       Balance Overall balance assessment: Needs assistance Sitting-balance support: Bilateral upper extremity supported;Feet supported Sitting balance-Leahy Scale: Poor Sitting balance - Comments: min A to sit edge of chair. Pt able to assist pulling trunk forward off back of chair.                            Cognition Arousal/Alertness: Awake/alert Behavior During Therapy: WFL for tasks assessed/performed Overall Cognitive Status: Difficult to assess                      Exercises General Exercises - Lower Extremity Ankle Circles/Pumps: AAROM;10 reps;Supine Long Arc Quad: AAROM;Both;5 reps;Seated Heel Slides: AAROM;Both;10  reps;Supine    General Comments        Pertinent Vitals/Pain Pain Assessment: No/denies pain    Home Living                      Prior Function            PT Goals (current goals can now be found in the care plan section) Progress towards PT goals: Progressing toward goals    Frequency  Min 3X/week    PT Plan Discharge plan needs to be updated    Co-evaluation             End of Session Equipment Utilized During Treatment: Oxygen Activity Tolerance: Patient tolerated treatment well Patient left: in chair;with call bell/phone within reach;with chair alarm set     Time: 4562-5638 PT Time Calculation (min) (ACUTE ONLY): 32 min  Charges:  $Therapeutic Activity: 23-37 mins                    G Codes:      Neale Marzette Nov 02, 2014, 1:36 PM  [employer] PT [phone]

## 2014-10-08 NOTE — Progress Notes (Signed)
NUTRITION FOLLOW UP  Intervention:   -Continue Vital AF 1.2 at 70 ml/hr via PEG to provide 2016 kcals (100% of estimated kcal needs), 126 gm protein, and 1362 ml free water daily.  Nutrition Dx:   Inadequate oral intake related to inability to eat as evidenced by NPO status; ongoing.  Goal:   Intake to meet >90% of estimated nutrition needs; met  Monitor:   TF tolerance/adequacy, weight trend, labs.  Assessment:   Patient was transferred from [facility] to [facility] on 2/22 with respiratory distress, acute renal failure, AG metabolic acidosis, and shock.  Spoke with RN pt is tolerating TF well. Vital AF 1.2 is infusing via PEG at goal rate of 70 ml/hr, which provides 2016 kcals, 126 gm protein, and 1362 ml free water daily (2112 ml fluid with 250 ml flush TID ordered). No gastric residuals per flowsheet.  RN also reports that this is the first time pt has been trach collar fully for 24 hours.  Pt remains NPO with ice chips with PSMV following oral care. MD requesting speech re-eval for possible diet advancement.  Per MD, possible d/c 10/11/14, pending SNF or LTACH bed approval.  Labs reviewed. K: 3.2, BUN: 42, Glucose: 142, CBGS: 114-125.  Height: Ht Readings from Last 1 Encounters:  09/18/14 _0  (1.702 m)    Weight Status:   Wt Readings from Last 1 Encounters:  10/08/14 225 lb 15.5 oz (102.5 kg)   10/02/14 218 lb 7.6 oz (99.1 kg)   Admission weight: 245 lb 2.4 oz (111.2 kg)     Re-estimated needs:  Kcal: 2000-2200 Protein: 125-135 grams Fluid: 2.0-2.2 L  Skin: stage II pressure ulcer on buttocks  Diet Order: Diet NPO time specified Except for: Ice Chips   Intake/Output Summary (Last 24 hours) at 10/08/14 1006 Last data filed at 10/08/14 0900  Gross per 24 hour  Intake   1910 ml  Output   2162 ml  Net   -252 ml    Last BM: 10/07/14   Labs:   Recent Labs Lab 10/02/14 0416  10/05/14 0346 10/06/14 0242 10/07/14 0400 10/08/14 0247  NA 142  < > 149*  147* 143 142  K 3.8  < > 3.4* 3.6 3.2* 3.5  CL 105  < > 112 113* 111 110  CO2 28  < > _1 BUN 57*  < > 55* 48* 42* 41*  CREATININE 1.09  < > 1.03 0.92 0.85 0.83  CALCIUM 9.0  < > 9.5 9.5 9.4 9.6  MG 1.9  --  2.1  --   --   --   PHOS  --   --  3.5  --   --   --   GLUCOSE 144*  < > 146* 115* 146* 133*  < > = values in this interval not displayed.  CBG (last 3)   Recent Labs  10/07/14 2358 10/08/14 0320 10/08/14 0754  GLUCAP 120* 125* 114*    Scheduled Meds: . antiseptic oral rinse  7 mL Mouth Rinse QID  . cefTAZidime (FORTAZ)  IV  1 g Intravenous 3 times per day  . chlorhexidine  15 mL Mouth Rinse BID  . free water  250 mL Per Tube 3 times per day  . heparin subcutaneous  5,000 Units Subcutaneous 3 times per day  . insulin aspart  0-20 Units Subcutaneous 6 times per day  . labetalol  200 mg Per Tube TID  . pantoprazole sodium  40 mg Per  Tube Daily    Continuous Infusions: . feeding supplement (VITAL AF 1.2 CAL) 1,000 mL (10/08/14 0803)    Naureen Benton A. Jimmye Norman, RD, LDN, CDE Pager: [phone] After hours Pager: [phone]

## 2014-10-08 NOTE — Procedures (Signed)
Objective Swallowing Evaluation: Fiberoptic Endoscopic Evaluation of Swallowing  Patient Details  Name: Jamie Fields MRN: [id number] Date of Birth: Dec 23, 1955  Today's Date: 10/08/2014 Time: SLP Start Time (ACUTE ONLY): 1350 (Simultaneous filing. User may not have seen previous data.)-SLP Stop Time (ACUTE ONLY): 1410 (Simultaneous filing. User may not have seen previous data.) SLP Time Calculation (min) (ACUTE ONLY): 20 min (Simultaneous filing. User may not have seen previous data.)  Past Medical History:  Past Medical History  Diagnosis Date  . COPD (chronic obstructive pulmonary disease)   . GERD (gastroesophageal reflux disease)   . Asthma   . HTN (hypertension)   . Depression   . Anxiety    Past Surgical History:  Past Surgical History  Procedure Laterality Date  . Tracheostomy      feinstein   HPI:  HPI: 59 y.o. F who was taken to [facility] ED early AM hours 2/22 for respiratory distress intubated and transferred to [facility]. 2/26 bradycardia with PEA. Pt with renal failure, shock, encephalopathy, polyneuropathy, CVVHD 2/27-2/29, ETT 2/22-3/04; trach 3/4; Gtube 3/7.   (Simultaneous filing. User may not have seen previous data.)  No Data Recorded  Assessment / Plan / Recommendation CHL IP CLINICAL IMPRESSIONS 10/08/2014  Dysphagia Diagnosis Mild oral phase dysphagia;Mild pharyngeal phase dysphagia  Clinical impression Pt demonstrates a mild oral dysphagia characterized by prolonged mastication of solids and unnecessary mastication of purees. Oropharyngeal phase mildy impaired with slight delay in swallow initiation with initial sips. Function improved as study progressed with more timely swallowing occurring with consecutive straw sips. Pt may initaite a Dys 1 (puree) diet with thin liquids with PMSV always in place with PO intake, with full supervision.       CHL IP TREATMENT RECOMMENDATION 10/08/2014  Treatment Plan Recommendations Therapy as outlined in treatment plan below      CHL IP DIET RECOMMENDATION 10/08/2014  Diet Recommendations Dysphagia 1 (Puree);Thin liquid  Liquid Administration via Cup;Straw  Medication Administration Whole meds with puree  Compensations (None)  Postural Changes and/or Swallow Maneuvers Seated upright 90 degrees     CHL IP OTHER RECOMMENDATIONS 10/08/2014  Recommended Consults (None)  Oral Care Recommendations Oral care BID  Other Recommendations Place PMSV during PO intake     CHL IP FOLLOW UP RECOMMENDATIONS 10/08/2014  Follow up Recommendations Skilled Nursing facility     [facility] IP FREQUENCY AND DURATION 10/08/2014  Speech Therapy Frequency (ACUTE ONLY) min 2x/week  Treatment Duration 2 weeks     Pertinent Vitals/Pain NA    SLP Swallow Goals No flowsheet data found.  No flowsheet data found.    CHL IP REASON FOR REFERRAL 10/08/2014  Reason for Referral Objectively evaluate swallowing function     CHL IP ORAL PHASE 10/08/2014  Lips (None)  Tongue (None)  Mucous membranes (None)  Nutritional status (None)  Other (None)  Oxygen therapy (None)  Oral Phase Impaired  Oral - Pudding Teaspoon (None)  Oral - Pudding Cup (None)  Oral - Honey Teaspoon (None)  Oral - Honey Cup (None)  Oral - Honey Syringe (None)  Oral - Nectar Teaspoon (None)  Oral - Nectar Cup (None)  Oral - Nectar Straw (None)  Oral - Nectar Syringe (None)  Oral - Ice Chips (None)  Oral - Thin Teaspoon (None)  Oral - Thin Cup WFL  Oral - Thin Straw WFL  Oral - Thin Syringe (None)  Oral - Puree Delayed oral transit  Oral - Mechanical Soft (None)  Oral - Regular Impaired mastication;Delayed oral transit  Oral -  Multi-consistency (None)  Oral - Pill (None)  Oral Phase - Comment (None)      CHL IP PHARYNGEAL PHASE 10/08/2014  Pharyngeal Phase Impaired  Pharyngeal - Pudding Teaspoon (None)  Penetration/Aspiration details (pudding teaspoon) (None)  Pharyngeal - Pudding Cup (None)  Penetration/Aspiration details (pudding cup) (None)  Pharyngeal  - Honey Teaspoon (None)  Penetration/Aspiration details (honey teaspoon) (None)  Pharyngeal - Honey Cup (None)  Penetration/Aspiration details (honey cup) (None)  Pharyngeal - Honey Syringe (None)  Penetration/Aspiration details (honey syringe) (None)  Pharyngeal - Nectar Teaspoon (None)  Penetration/Aspiration details (nectar teaspoon) (None)  Pharyngeal - Nectar Cup (None)  Penetration/Aspiration details (nectar cup) (None)  Pharyngeal - Nectar Straw (None)  Penetration/Aspiration details (nectar straw) (None)  Pharyngeal - Nectar Syringe (None)  Penetration/Aspiration details (nectar syringe) (None)  Pharyngeal - Ice Chips (None)  Penetration/Aspiration details (ice chips) (None)  Pharyngeal - Thin Teaspoon (None)  Penetration/Aspiration details (thin teaspoon) (None)  Pharyngeal - Thin Cup Delayed swallow initiation  Penetration/Aspiration details (thin cup) (None)  Pharyngeal - Thin Straw WFL  Penetration/Aspiration details (thin straw) (None)  Pharyngeal - Thin Syringe (None)  Penetration/Aspiration details (thin syringe') (None)  Pharyngeal - Puree WFL  Penetration/Aspiration details (puree) (None)  Pharyngeal - Mechanical Soft (None)  Penetration/Aspiration details (mechanical soft) (None)  Pharyngeal - Regular Delayed swallow initiation  Penetration/Aspiration details (regular) (None)  Pharyngeal - Multi-consistency (None)  Penetration/Aspiration details (multi-consistency) (None)  Pharyngeal - Pill (None)  Penetration/Aspiration details (pill) (None)  Pharyngeal Comment (None)     CHL IP CERVICAL ESOPHAGEAL PHASE 10/08/2014  Cervical Esophageal Phase WFL  Pudding Teaspoon (None)  Pudding Cup (None)  Honey Teaspoon (None)  Honey Cup (None)  Honey Syringe (None)  Nectar Teaspoon (None)  Nectar Cup (None)  Nectar Straw (None)  Nectar Syringe (None)  Thin Teaspoon (None)  Thin Cup (None)  Thin Straw (None)  Thin Syringe (None)  Cervical Esophageal Comment  (None)    No flowsheet data found.        Herbie Baltimore, [place] CCC-SLP [phone]  Lynann Beaver 10/08/2014, 2:21 PM

## 2014-10-08 NOTE — Progress Notes (Signed)
Speech Language Pathology Treatment: Jamie Fields Speaking valve  Patient Details Name: Jamie Fields MRN: [id number] DOB: 1955/11/19 Today's Date: 10/08/2014 Time: 1350-1410 SLP Time Calculation (min) (ACUTE ONLY): 20 min  Assessment / Plan / Recommendation Clinical Impression  Pt wore speaking valve for 20 minutes without evidence of air trapping. RR fluctuated between 18-31; O2-100. Pt was able to cough and phonate, however pt's vocal quality continues to be breathy and of low intensity due to poor respiratory support. When given verbal cues for complete inhalation before phonation pt able to produce clear voice. Recommend pt wear PMV during all meals and intermittently with full supervision. Speech will follow-up for diet tolerance, possible upgrade and PMV treatment.    HPI HPI: 59 y.o. F who was taken to [facility] ED early AM hours 2/22 for respiratory distress intubated and transferred to [facility]. 2/26 bradycardia with PEA. Pt with renal failure, shock, encephalopathy, polyneuropathy, CVVHD 2/27-2/29, ETT 2/22-3/04; trach 3/4; Gtube 3/7.     Pertinent Vitals Pain Assessment: No/denies pain  SLP Plan  Continue with current plan of care    Recommendations Medication Administration: Whole meds with puree Supervision: Staff to assist with self feeding;Full supervision/cueing for compensatory strategies Postural Changes and/or Swallow Maneuvers: Seated upright 90 degrees      Patient may use Passy-Muir Speech Valve: Intermittently with supervision;During PO intake/meals PMSV Supervision: Full       Oral Care Recommendations: Oral care BID Follow up Recommendations: Skilled Nursing facility Plan: Continue with current plan of care    GO     Jamie Fields 10/08/2014, 2:28 PM

## 2014-10-08 NOTE — Progress Notes (Signed)
CSW (Clinical Education officer, museum) sent updated clinicals out for [employer]. Will provide family with updated bed offers tomorrow.  [place], [place]

## 2014-10-08 NOTE — Progress Notes (Signed)
PULMONARY / CRITICAL CARE MEDICINE   Name: Jamie Fields MRN: [id number] DOB: 07/06/1956    ADMISSION DATE:  09/17/2014 CONSULTATION DATE:  09/17/2014   REFERRING MD :  [facility]  CHIEF COMPLAINT:  Respiratory Distress  INITIAL PRESENTATION:  59 y.o. F who was taken to [facility] ED early AM hours 2/22 for respiratory distress.  She was admitted and placed on BiPAP.  Was also found to have acute renal failure, AG metabolic acidosis, Overnight, her acidosis and respiratory status worsened to the point she required intubation.  She was also placed on levophed for persistent shock.  Later in day 2/22 was transferred to [facility] for further management.  STUDIES:  CT Chest 2/22 >> no acute process CT abd / pelv 2/22 >> iliac lymph nodes likely reactive.  No acute process CXR 2/23  >> Persistent mild vascular congestion and mild interstitial edema bilaterally. Superimposed infiltrate right upper lobe and left base cannot be excluded. Echo 6/27 >> Systolic function was normal. EF 60% to 65%. Right Tib/Fib DG >> No evidence of osteo; Mild soft tissue swelling RUQ [place] 2/23 >> Liver echogenicity is diffusely increased and somewhat heterogeneous. Most likely are indicative of hepatic steatosis CT tib/fib Rt >> Negative for osteomyelitis or necrotizing fasciitis. Superficial edema and blistering CXR 2/28 >> No change in bilateral airspace disease opacities representing multifocal pneumonia versus asymmetric edema 3/1 ct head >>> neg 3/1 eeg >> slow, enceph 3/2 MRI brain >>> neg acute, sulci flare, may be related to high fio2 3/3 awake, but not moving extremities 3/3 mri neck - neg   SIGNIFICANT EVENTS: 2/22  Admitted at [place]; respiratory failure-intubated; shock; transferred to [facility] 2/23  [place]; intubated; Fever, CXR infiltrate = sepsis 2/24  Group A strep, added [place]; CT neg for nec fas; Tube feeds started 2/26  Hypoxia, brady arrest/ PEA in pm  2/27  CVVHD initiated  2/28  Pt  net neg 6L; New CXR infiltrate - Vanc restarted 2/29- cvvhd off, urine output increased 3/4 Trach  3/5 -1 u PRBC  3/7 - Gtube placed 3/8 - transfer to SDU 3/9- failed TC, back to vent, neg 1.4 liters  SUBJECTIVE:  Tolerating ATC overnight, vent remains in room.  Afebrile overnight.  VITAL SIGNS: Temp:  [98.3 F (36.8 C)-100.1 F (37.8 C)] 99.4 F (37.4 C) (03/14 0759) Pulse Rate:  [71-89] 89 (03/14 0759) Resp:  [16-34] 22 (03/14 0805) BP: (132-173)/(54-78) 165/68 mmHg (03/14 0805) SpO2:  [96 %-100 %] 96 % (03/14 0805) FiO2 (%):  [28 %-35 %] 28 % (03/14 0759) Weight:  [102.5 kg (225 lb 15.5 oz)] 102.5 kg (225 lb 15.5 oz) (03/14 0318)   INTAKE / OUTPUT:  Intake/Output Summary (Last 24 hours) at 10/08/14 1012 Last data filed at 10/08/14 0900  Gross per 24 hour  Intake   1910 ml  Output   2162 ml  Net   -252 ml   PHYSICAL EXAMINATION: General: Obese white female, NAD Neuro: PERRL HEENT: Trach site clean Cardiovascular: RRR, no murmur, gallops, rubs.  Lungs: Lungs rhonchi.  Abdomen: Soft, non-tender, non-distended. BS +. G-tube site clean. Musculoskeletal: No gross deformities. +2 pitting edema.  Skin: Generally intact.   LABS:  CBC  Recent Labs Lab 10/06/14 0242 10/07/14 0400 10/08/14 0247  WBC 4.6 4.2 4.6  HGB 7.4* 7.1* 7.3*  HCT 24.1* 22.8* 23.1*  PLT 302 259 252   Coag's No results for input(s): APTT, INR in the last 168 hours. BMET  Recent Labs Lab  10/06/14 0242 10/07/14 0400 10/08/14 0247  NA 147* 143 142  K 3.6 3.2* 3.5  CL 113* 111 110  CO2 27 26 26   BUN 48* 42* 41*  CREATININE 0.92 0.85 0.83  GLUCOSE 115* 146* 133*   Electrolytes  Recent Labs Lab 10/02/14 0416  10/05/14 0346 10/06/14 0242 10/07/14 0400 10/08/14 0247  CALCIUM 9.0  < > 9.5 9.5 9.4 9.6  MG 1.9  --  2.1  --   --   --   PHOS  --   --  3.5  --   --   --   < > = values in this interval not displayed.   Liver Enzymes  Recent Labs Lab 10/04/14 0648  AST 127*  ALT  126*  ALKPHOS 280*  BILITOT 0.7  ALBUMIN 2.0*    Glucose  Recent Labs Lab 10/07/14 1253 10/07/14 1615 10/07/14 1930 10/07/14 2358 10/08/14 0320 10/08/14 0754  GLUCAP 159* 106* 137* 120* 125* 114*    Imaging No results found.   ASSESSMENT / PLAN:  PULMONARY ETT 2/22 >>> 3/04 Trach (DF) 3/04 >>> A: Acute hypoxic respiratory failure 2/2 to PNA, sepsis s/p trach 3/4.  COPD without evidence of exacerbation. ARDS >> resolved.  P:   ATC 24/7 at this point. Titrate O2 for sat of 88-92% Speech pathology for PMV.  CARDIOVASCULAR CVL R fem 2/22 >> 2/23 CVL LIJ 2/23 >> 3/8  A:  Shock, Septic/Hypovolemic. Remains off pressors since 3/5 HTN Adrenal Insufficiency >> resolved. Normal ECHO 09/18/14 Hypertensive overnight. P:  Increase labetalol to 200 mg PO TID with holding parameters, will increase to 300 in AM if remains hypertensive.  RENAL A:   Mixed metabolic acidosis 2/2 lactic acidosis/acute renal failure. Resolved.  Acute renal failure; resolved.  CKD stage 2; Cr at baseline (1.1) Rhabdo; resolved.  Pseudohypocalcemia; resolved.  Hypernatremia resolved. Hypokalemia P:   Continue free water 200 ml q8 hours Hold further diureses. Replace K. BMET in AM.   GASTROINTESTINAL A:  Transaminitis; resolved. GERD Obesity Severe protein calorie malnutrition Diarrhea; C. Diff NEG Off vent P:   Protonix Tube feeds Swallow eval now that off vent.  HEMATOLOGIC A:   Anemia of Chronic Disease; s/p PRBC x 1 on 3/3, 3/5. Hb improving DVT/PE PPx Thrombocytopenia; resolved, now thrombocytosis, likely reactive (458K) P:  Heparin [place]/SCD's  INFECTIOUS A:   GAS Cellulitis/Pneumonia w/ Septic Shock >> completed Abx 3/07 3/10 fevers, UA concerning P:   Resp 2/23 >>> RARE GROUP A STREP (S.PYOGENES) ISOLATED RVP 2/23 >> neg 3/10 urine >>  Fevers, ct ceftaz 3/10 >>3/18 Culture data negative.  ENDOCRINE A:   Hyperglycemia P:   SSI while on tube  feeds  NEUROLOGIC A:   Acute metabolic encephalopathy; resolved. H/o Depression, Anxiety, Chronic Pain. Functional quadriplegia s/p critical illness/critical care polyneuropathy; improving. MRI Brain/cervical spine 3/7 w/ no acute findings P:   Neuro following; consider EMG as an outpatient.  PT/OT  Summary - Off vent, if continues off vent towards the end of the week then will be ready for discharge to SNF.  PMV and swallow as ordered.  Hold diureses.  Continue free water.  Adjust anti-HTN as above.  Rush Farmer, M.D. [facility] Pulmonary/Critical Care Medicine. Pager: [phone]. After hours pager: [phone].  10/08/2014 10:12 AM

## 2014-10-09 DIAGNOSIS — G7281 Critical illness myopathy: Secondary | ICD-10-CM

## 2014-10-09 LAB — GLUCOSE, CAPILLARY
GLUCOSE-CAPILLARY: 137 mg/dL — AB (ref 70–99)
Glucose-Capillary: 129 mg/dL — ABNORMAL HIGH (ref 70–99)
Glucose-Capillary: 122 mg/dL — ABNORMAL HIGH (ref 70–99)
Glucose-Capillary: 137 mg/dL — ABNORMAL HIGH (ref 70–99)
Glucose-Capillary: 102 mg/dL — ABNORMAL HIGH (ref 70–99)

## 2014-10-09 LAB — BASIC METABOLIC PANEL
Anion gap: 7 (ref 5–15)
BUN: 40 mg/dL — ABNORMAL HIGH (ref 6–23)
CO2: 26 mmol/L (ref 19–32)
Calcium: 9.5 mg/dL (ref 8.4–10.5)
Chloride: 111 mmol/L (ref 96–112)
Creatinine, Ser: 0.75 mg/dL (ref 0.50–1.10)
GFR calc Af Amer: >90 mL/min (ref >90)
Glucose, Bld: 138 mg/dL — ABNORMAL HIGH (ref 70–99)
POTASSIUM: 3.4 mmol/L — AB (ref 3.5–5.1)
SODIUM: 144 mmol/L (ref 135–145)

## 2014-10-09 LAB — MAGNESIUM: MAGNESIUM: 1.8 mg/dL (ref 1.5–2.5)

## 2014-10-09 LAB — CBC
HCT: 24.1 % — ABNORMAL LOW (ref 36.0–46.0)
HEMOGLOBIN: 7.6 g/dL — AB (ref 12.0–15.0)
MCH: 28.8 pg (ref 26.0–34.0)
MCHC: 31.5 g/dL (ref 30.0–36.0)
MCV: 91.3 fL (ref 78.0–100.0)
Platelets: 234 10*3/uL (ref 150–400)
RBC: 2.64 MIL/uL — ABNORMAL LOW (ref 3.87–5.11)
RDW: 18.6 % — AB (ref 11.5–15.5)
WBC: 4.6 10*3/uL (ref 4.0–10.5)

## 2014-10-09 LAB — PHOSPHORUS: PHOSPHORUS: 3.7 mg/dL (ref 2.3–4.6)

## 2014-10-09 MED ORDER — POTASSIUM CHLORIDE 20 MEQ/15ML (10%) PO SOLN
40.0000 meq | Freq: Three times a day (TID) | ORAL | Status: AC
  Administered 2014-10-09 (×2): 40 meq
  Filled 2014-10-09 (×4): qty 30

## 2014-10-09 MED ORDER — MAGNESIUM SULFATE 2 GM/50ML IV SOLN
2.0000 g | Freq: Once | INTRAVENOUS | Status: AC
  Administered 2014-10-09: 2 g via INTRAVENOUS
  Filled 2014-10-09: qty 50

## 2014-10-09 MED ORDER — FUROSEMIDE 10 MG/ML IJ SOLN
40.0000 mg | Freq: Three times a day (TID) | INTRAMUSCULAR | Status: AC
Start: 2014-10-09 — End: 2014-10-09
  Administered 2014-10-09 (×2): 40 mg via INTRAVENOUS
  Filled 2014-10-09 (×2): qty 4

## 2014-10-09 NOTE — Progress Notes (Signed)
Occupational Therapy Treatment Patient Details Name: Jamie Fields MRN: [id number] DOB: 1955/12/28 Today's Date: 10/09/2014    History of present illness 59 y.o. F who was taken to [facility] ED early AM hours 2/22 for respiratory distress intubated and transferred to [facility]. 2/26 bradycardia with PEA. Pt with renal failure, shock, encephalopathy, polyneuropathy, CVVHD 2/27-2/29, trach 3/4, gtube 3/7   OT comments  Pt is making steady gains in strength, balance, activity tolerance and mobility. Motivated to get OOB today. Performed bed mobility, sitting, sit to stand, UB exercise and transfer with maxisky to chair this visit on trach collar with passey muir valve in place.  Pt reports less WOB in sitting. Pt is a good rehab candidate.  Follow Up Recommendations  CIR;Supervision/Assistance - 24 hour    Equipment Recommendations       Recommendations for Other Services      Precautions / Restrictions Precautions Precautions: Fall Precaution Comments: trach, gtube Restrictions Weight Bearing Restrictions: No       Mobility Bed Mobility Overal bed mobility: Needs Assistance Bed Mobility: Rolling;Supine to Sit;Sit to Supine Rolling: Max assist   Supine to sit: +2 for physical assistance;Max assist Sit to supine: +2 for physical assistance;Max assist   General bed mobility comments: Pt able to assist by bending up opposite knee, turning head, and to reach across with opposite arm.  Transfers Overall transfer level: Needs assistance Equipment used: 2 person hand held assist Transfers: Sit to/from Stand Sit to Stand: Max assist;+2 physical assistance         General transfer comment: Assist to bring hips up and support trunk. Used bed pad under hips to bring up. Used maxisky to transfer bed to recliner.    Balance Overall balance assessment: Needs assistance Sitting-balance support: Single extremity supported;Feet supported Sitting balance-Leahy Scale: Poor Sitting balance  - Comments: Sat EOB x 10 minutes with min guard to min A. Pt able to sit without physical assistance for several minutes.   Standing balance support: Bilateral upper extremity supported Standing balance-Leahy Scale: Zero Standing balance comment: stood x 20 sec with +2 max                   ADL Overall ADL's : Needs assistance/impaired Eating/Feeding: Total assistance;Sitting                                     General ADL Comments: Pt now able to eat per ST, continues to require total assist.      Vision                     Perception     Praxis      Cognition   Behavior During Therapy: [facility] for tasks assessed/performed   Area of Impairment: Problem solving;Safety/judgement        Following Commands: Follows one step commands with increased time Safety/Judgement: Decreased awareness of deficits   Problem Solving: Slow processing      Extremity/Trunk Assessment               Exercises General Exercises - Upper Extremity Shoulder Flexion: AAROM;Both;10 reps;Supine Elbow Flexion: AROM;Both;10 reps;Supine Elbow Extension: Both;10 reps;Supine;AROM Digit Composite Flexion: AAROM;Both;10 reps;Supine Composite Extension: AAROM;Both;10 reps;Supine   Shoulder Instructions       General Comments      Pertinent Vitals/ Pain       Pain Assessment: No/denies pain  Home Living  Prior Functioning/Environment              Frequency Min 2X/week     Progress Toward Goals  OT Goals(current goals can now be found in the care plan section)  Progress towards OT goals: Progressing toward goals  Acute Rehab OT Goals Patient Stated Goal: return home  Plan Discharge plan needs to be updated    Co-evaluation    PT/OT/SLP Co-Evaluation/Treatment: Yes Reason for Co-Treatment: For patient/therapist safety;Complexity of the patient's impairments (multi-system involvement) PT  goals addressed during session: Mobility/safety with mobility;Balance OT goals addressed during session: Strengthening/ROM      End of Session Equipment Utilized During Treatment: Oxygen;Gait belt   Activity Tolerance Patient tolerated treatment well   Patient Left in chair;with call bell/phone within reach;with nursing/sitter in room (soft touch call bell)   Nurse Communication          Time: 1031-5945 OT Time Calculation (min): 42 min  Charges: OT General Charges $OT Visit: 1 Procedure OT Treatments $Therapeutic Activity: 8-22 mins  Malka So 10/09/2014, 11:51 AM

## 2014-10-09 NOTE — Progress Notes (Signed)
Speech Language Pathology Treatment: Dysphagia;Cognitive-Linquistic;Passy Muir Speaking valve  Patient Details Name: Jamie Fields MRN: [id number] DOB: [birth year] Today's Date: 10/09/2014 Time: 1020-1050 SLP Time Calculation (min) (ACUTE ONLY): 30 min  Assessment / Plan / Recommendation Clinical Impression  Pt was seen for skilled ST treatment targeting speaking valve toleration, speech intelligibility, and dysphagia goals.  Upon arrival, pt was seated upright in recliner with PMV already in place.  With valve in place, pt's vitals remained WFL (HR 77, O2 96, and RR max of 35) and pt demonstrated no overt s/s of distress.  Pt was able to achieve more consistent voicing during today's treatment session in comparison to previous notes with mild-moderate breathiness and harsh vocal quality noted.  Pt's overall speech intelligibility was limited by decreased breath support at the sentence level; however, pt was able to return demonstration of increased vocal intensity, chunking, and taking deep breaths prior to talking with min-mod assist to improve speech intelligibility to >75% in phrases.  SLP also facilitated the session with trials of advanced dys 2 textures and solid cracker consistencies to continue working towards diet progression.  Pt demonstrated improved timeliness for mastication and posterior transit of solids in comparison to previous notes.  No overt s/s of aspiration were noted with dys 2 textures or thin liquids via cup sips; however, pt demonstrated a delayed cough following trials of crackers which SLP suspects to be related to dry residual solids left in the pharynx post swallow.  Recommend that pt's diet be upgraded to dys 2 textures with continued thin liquids.     HPI HPI: 59 y.o. F who was taken to [facility] ED early AM hours 2/22 for respiratory distress intubated and transferred to [facility]. 2/26 bradycardia with PEA. Pt with renal failure, shock, encephalopathy, polyneuropathy, CVVHD  2/27-2/29, ETT 2/22-3/04; trach 3/4; Gtube 3/7.     Pertinent Vitals Pain Assessment: No/denies pain  SLP Plan  Continue with current plan of care    Recommendations Diet recommendations: Dysphagia 2 (fine chop);Thin liquid Medication Administration: Whole meds with puree Supervision: Staff to assist with self feeding;Full supervision/cueing for compensatory strategies Postural Changes and/or Swallow Maneuvers: Seated upright 90 degrees      Patient may use Passy-Muir Speech Valve: Intermittently with supervision;During PO intake/meals PMSV Supervision: Full       Oral Care Recommendations: Oral care BID Follow up Recommendations: Skilled Nursing facility;Inpatient Rehab Plan: Continue with current plan of care    GO     PageSelinda Orion 10/09/2014, 12:49 PM

## 2014-10-09 NOTE — Progress Notes (Signed)
Inpatient Rehabilitation Screen  I received a phone call from [facility], PT that PT is now recommending CIR for this patient.  At this time, we are recommending an IP Rehab consult.  MD, please order if you are agreeable.  Thanks!  [place] Admissions Coordinator Cell [phone] Office [phone]

## 2014-10-09 NOTE — Progress Notes (Signed)
Physical Therapy Treatment Patient Details Name: MYLENA SEDBERRY MRN: [id number] DOB: 10/06/1955 Today's Date: 10/09/2014    History of Present Illness 59 y.o. F who was taken to [facility] ED early AM hours 2/22 for respiratory distress intubated and transferred to [facility]. 2/26 bradycardia with PEA. Pt with renal failure, shock, encephalopathy, polyneuropathy, CVVHD 2/27-2/29, trach 3/4, gtube 3/7    PT Comments    Pt making good progress with therapy. Feel she could tolerate CIR and would continue to make excellent progress.  Follow Up Recommendations  CIR     Equipment Recommendations  Other (comment) (to be assessed)    Recommendations for Other Services       Precautions / Restrictions Precautions Precautions: Fall Precaution Comments: trach, gtube Restrictions Weight Bearing Restrictions: No    Mobility  Bed Mobility Overal bed mobility: Needs Assistance Bed Mobility: Rolling;Supine to Sit;Sit to Supine Rolling: Max assist   Supine to sit: +2 for physical assistance;Max assist Sit to supine: +2 for physical assistance;Max assist   General bed mobility comments: Pt able to assist by bending up opposite knee, turning head, and to reach across with opposite arm.  Transfers Overall transfer level: Needs assistance Equipment used: 2 person hand held assist Transfers: Sit to/from Stand Sit to Stand: Max assist         General transfer comment: Assist to bring hips up and support trunk. Used bed pad under hips to bring up. Used maxisky to transfer bed to recliner.  Ambulation/Gait                 Stairs            Wheelchair Mobility    Modified Rankin (Stroke Patients Only)       Balance Overall balance assessment: Needs assistance Sitting-balance support: Single extremity supported;Feet supported Sitting balance-Leahy Scale: Poor Sitting balance - Comments: Sat EOB x 10 minutes with min guard to min A. Pt able to sit without physical  assistance for several minutes.   Standing balance support: Bilateral upper extremity supported Standing balance-Leahy Scale: Zero Standing balance comment: stood x 20 sec with +2 max                    Cognition Arousal/Alertness: Awake/alert Behavior During Therapy: WFL for tasks assessed/performed   Area of Impairment: Problem solving;Safety/judgement       Following Commands: Follows one step commands with increased time Safety/Judgement: Decreased awareness of deficits   Problem Solving: Slow processing      Exercises      General Comments        Pertinent Vitals/Pain Pain Assessment: No/denies pain    Home Living                      Prior Function            PT Goals (current goals can now be found in the care plan section) Progress towards PT goals: Progressing toward goals    Frequency       PT Plan Discharge plan needs to be updated    Co-evaluation             End of Session Equipment Utilized During Treatment: Oxygen Activity Tolerance: Patient tolerated treatment well Patient left: in chair;with call bell/phone within reach;with chair alarm set     Time: 2094-7096 PT Time Calculation (min) (ACUTE ONLY): 42 min  Charges:  $Therapeutic Activity: 23-37 mins  G Codes:      Munachimso Palin 10/09/2014, 10:31 AM  Suanne Marker PT [phone]

## 2014-10-09 NOTE — Consult Note (Signed)
Physical Medicine and Rehabilitation Consult Reason for Consult: Debilitation/respiratory failure/encephalopathy Referring Physician: Critical care   HPI: Jamie Fields is a 59 y.o. right handed female with history of COPD. Presented to outside hospital [facility] emergency department 09/17/2014 with increasing shortness of breath respiratory distress. Patient was admitted placed on BiPAP. Also found to have acute renal failure, metabolic acidosis. Respiratory status continued to worsen to the point requiring intubation and was transferred to [facility] for further management. Chest x-ray showed cardiac enlargement with vascular congestion and bilateral effusions. CT of the chest showed no acute process. CT abdomen and pelvis unremarkable. Echocardiogram was systolic function normal ejection fraction 65%. Complaints of pain to the right tibia-fibula no evidence of osteomyelitis there was some mild soft tissue swelling. Bouts of confusion altered mental status with cranial CT scan negative. EEG showed no seizure with generalized slowing suspect encephalopathy. MRI of the brain negative. Follow-up critical care pulmonary services patient ultimately required tracheostomy tube 09/28/2014 as well as gastrostomy tube for nutritional support 10/01/2014 per interventional radiology. Hospital course treated for septic shock as well as pneumonia with broad-spectrum antibiotics. Renal function slowly returned to baseline with laser creatinine 0.75. Patient with persistent weakness again is noted MRI as well as cervical spine 10/01/2014 with no acute findings. Suspect critical illness myopathy polyneuropathy. A diet has been initiated dysphagia 2 thin liquids. Subcutaneous heparin for DVT prophylaxis. Speech therapy has been following for teaching and training with PMV. Patient does not meet criteria for LTAC under her insurance. Physical therapy evaluation completed and has been ongoing with  recommendations of physical medicine rehabilitation consult.   Review of Systems  Respiratory: Positive for shortness of breath.   Gastrointestinal:       GERD  Musculoskeletal: Positive for myalgias.  All other systems reviewed and are negative.  Past Medical History  Diagnosis Date  . COPD (chronic obstructive pulmonary disease)   . GERD (gastroesophageal reflux disease)   . Asthma   . HTN (hypertension)   . Depression   . Anxiety    Past Surgical History  Procedure Laterality Date  . Tracheostomy      feinstein   Family History  Problem Relation Age of Onset  . Hyperlipidemia Mother   . Hypertension Father    Social History:  has no tobacco, alcohol, and drug history on file. Allergies: No Known Allergies Medications Prior to Admission  Medication Sig Dispense Refill  . albuterol (PROVENTIL) (2.5 MG/3ML) 0.083% nebulizer solution Take 2.5 mg by nebulization every 4 (four) hours as needed for wheezing or shortness of breath.     Marland Kitchen amitriptyline (ELAVIL) 25 MG tablet Take 50 mg by mouth at bedtime.     Marland Kitchen atorvastatin (LIPITOR) 10 MG tablet Take 10 mg by mouth at bedtime.     . diclofenac (VOLTAREN) 75 MG EC tablet Take 75 mg by mouth 2 (two) times daily as needed (pain).     . DULoxetine (CYMBALTA) 30 MG capsule Take 60 mg by mouth daily.     Marland Kitchen esomeprazole (NEXIUM) 40 MG capsule Take 40 mg by mouth daily.     Marland Kitchen gabapentin (NEURONTIN) 800 MG tablet Take 800 mg by mouth 3 (three) times daily.     Marland Kitchen ibuprofen (ADVIL,MOTRIN) 200 MG tablet Take 200 mg by mouth every 4 (four) hours as needed for fever (pain).    . Isopropyl Alcohol 95 % LIQD Place 4-5 drops in ear(s) as needed (water in ears).    Marland Kitchen  meclizine (ANTIVERT) 25 MG tablet Take 25 mg by mouth daily.     Marland Kitchen morphine (MS CONTIN) 15 MG 12 hr tablet Take 15 mg by mouth every 12 (twelve) hours.     . ondansetron (ZOFRAN) 8 MG tablet Take 8 mg by mouth 3 (three) times daily as needed for nausea or vomiting.    . OPANA ER,  CRUSH RESISTANT, 10 MG T12A 12 hr tablet Take 10 mg by mouth 2 (two) times daily.     Marland Kitchen oxyCODONE-acetaminophen (PERCOCET) 10-325 MG per tablet Take 1 tablet by mouth every 6 (six) hours as needed for pain.     Marland Kitchen oxymetazoline (AFRIN) 0.05 % nasal spray Place 1 spray into both nostrils 2 (two) times daily.    . phenylephrine (NEO-SYNEPHRINE) 0.5 % nasal solution Place 2 drops into both nostrils every 12 (twelve) hours as needed for congestion.    Marland Kitchen Phenylephrine-Guaifenesin 5-200 MG TABS Take 2 tablets by mouth every 4 (four) hours as needed (congestion/cough).    . sertraline (ZOLOFT) 100 MG tablet Take 200 mg by mouth daily.     . SUMAtriptan (IMITREX) 50 MG tablet Take 50 mg by mouth every 2 (two) hours as needed for migraine. Do not exceed 200 mg in 24 hours    . tamsulosin (FLOMAX) 0.4 MG CAPS capsule Take 0.4 mg by mouth daily. 30 minutes after the same meal every day    . traMADol (ULTRAM) 50 MG tablet Take 50-100 mg by mouth 2 (two) times daily. Alternate with oxycodone - do not take together      Home: Home Living Family/patient expects to be discharged to:: Private residence Living Arrangements: Alone Home Equipment: Shower seat, Bedside commode Additional Comments: Needs further assessment, pt with difficulty answering PLOF questions with trach, no family available.  Functional History: Prior Function Level of Independence: Independent Functional Status:  Mobility: Bed Mobility Overal bed mobility: Needs Assistance Bed Mobility: Rolling, Supine to Sit, Sit to Supine Rolling: Max assist Supine to sit: +2 for physical assistance, Max assist Sit to supine: +2 for physical assistance, Max assist General bed mobility comments: Pt able to assist by bending up opposite knee, turning head, and to reach across with opposite arm. Transfers Overall transfer level: Needs assistance Equipment used: 2 person hand held assist Transfers: Sit to/from Stand Sit to Stand: Max assist General  transfer comment: Assist to bring hips up and support trunk. Used bed pad under hips to bring up. Used maxisky to transfer bed to recliner.      ADL: ADL General ADL Comments: Dependent in all ADL at bed level. Pt is tube fed.  Cognition: Cognition Overall Cognitive Status: Difficult to assess Orientation Level: Oriented to place, Oriented to person, Disoriented to time Cognition Arousal/Alertness: Awake/alert Behavior During Therapy: WFL for tasks assessed/performed Overall Cognitive Status: Difficult to assess Area of Impairment: Problem solving, Safety/judgement Orientation Level: Disoriented to, Time Following Commands: Follows one step commands with increased time Safety/Judgement: Decreased awareness of deficits Problem Solving: Slow processing General Comments: some difficulty following commands consistently during UE exercise Difficult to assess due to: Tracheostomy (using passey muir)  Blood pressure 174/84, pulse 92, temperature 98.2 F (36.8 C), temperature source Oral, resp. rate 31, height 5\' 7"  (1.702 m), weight 103.1 kg (227 lb 4.7 oz), SpO2 99 %. Physical Exam  HENT:  Head: Normocephalic.  Eyes:  Pupils sluggish to light  Neck:  Tracheostomy tube in place. Can vocalize minimally without trach occlusion,   Cardiovascular: Normal rate and regular  rhythm.   Respiratory:  Decreased breath sounds clear to auscultation, dyspneic  GI: Soft. She exhibits no distension.  G-tube in place  Neurological:  Patient very lethargic during exam. She would open her eyes and make eye contact. She was appropriate for yes no simple questions. Follows simple one-step commands. She would easily fall back asleep during exam. UE: 1+ delt,tricep, wrist, hand. LE: 2-hf, ke, ankles. No obvious sensory abnl.    Results for orders placed or performed during the hospital encounter of 09/17/14 (from the past 24 hour(s))  Glucose, capillary     Status: Abnormal   Collection Time: 10/08/14  12:00 PM  Result Value Ref Range   Glucose-Capillary 129 (H) 70 - 99 mg/dL   Comment 1 Capillary Specimen   Glucose, capillary     Status: Abnormal   Collection Time: 10/08/14  4:40 PM  Result Value Ref Range   Glucose-Capillary 131 (H) 70 - 99 mg/dL   Comment 1 Capillary Specimen   Glucose, capillary     Status: Abnormal   Collection Time: 10/08/14  7:32 PM  Result Value Ref Range   Glucose-Capillary 116 (H) 70 - 99 mg/dL  Glucose, capillary     Status: Abnormal   Collection Time: 10/08/14 11:08 PM  Result Value Ref Range   Glucose-Capillary 126 (H) 70 - 99 mg/dL  CBC     Status: Abnormal   Collection Time: 10/09/14  2:33 AM  Result Value Ref Range   WBC 4.6 4.0 - 10.5 K/uL   RBC 2.64 (L) 3.87 - 5.11 MIL/uL   Hemoglobin 7.6 (L) 12.0 - 15.0 g/dL   HCT 24.1 (L) 36.0 - 46.0 %   MCV 91.3 78.0 - 100.0 fL   MCH 28.8 26.0 - 34.0 pg   MCHC 31.5 30.0 - 36.0 g/dL   RDW 18.6 (H) 11.5 - 15.5 %   Platelets 234 150 - 400 K/uL  Basic metabolic panel     Status: Abnormal   Collection Time: 10/09/14  2:33 AM  Result Value Ref Range   Sodium 144 135 - 145 mmol/L   Potassium 3.4 (L) 3.5 - 5.1 mmol/L   Chloride 111 96 - 112 mmol/L   CO2 26 19 - 32 mmol/L   Glucose, Bld 138 (H) 70 - 99 mg/dL   BUN 40 (H) 6 - 23 mg/dL   Creatinine, Ser 0.75 0.50 - 1.10 mg/dL   Calcium 9.5 8.4 - 10.5 mg/dL   GFR calc non Af Amer >90 >90 mL/min   GFR calc Af Amer >90 >90 mL/min   Anion gap 7 5 - 15  Magnesium     Status: None   Collection Time: 10/09/14  2:33 AM  Result Value Ref Range   Magnesium 1.8 1.5 - 2.5 mg/dL  Phosphorus     Status: None   Collection Time: 10/09/14  2:33 AM  Result Value Ref Range   Phosphorus 3.7 2.3 - 4.6 mg/dL  Glucose, capillary     Status: Abnormal   Collection Time: 10/09/14  3:13 AM  Result Value Ref Range   Glucose-Capillary 129 (H) 70 - 99 mg/dL  Glucose, capillary     Status: Abnormal   Collection Time: 10/09/14  8:05 AM  Result Value Ref Range   Glucose-Capillary  122 (H) 70 - 99 mg/dL   Comment 1 Capillary Specimen    No results found.  Assessment/Plan: Diagnosis: critical illness myopathy, after respiratory failure and multiple medical 1. Does the need for close, 24 hr/day medical  supervision in concert with the patient's rehab needs make it unreasonable for this patient to be served in a less intensive setting? Yes 2. Co-Morbidities requiring supervision/potential complications: ARDS, cardiac arrest, trach/PEG 3. Due to bladder management, bowel management, safety, skin/wound care, disease management, medication administration, pain management and patient education, does the patient require 24 hr/day rehab nursing? Yes 4. Does the patient require coordinated care of a physician, rehab nurse, PT (1-2 hrs/day, 5 days/week), OT (1-2 hrs/day, 5 days/week) and SLP (1-2 hrs/day, 5 days/week) to address physical and functional deficits in the context of the above medical diagnosis(es)? Yes Addressing deficits in the following areas: balance, endurance, locomotion, strength, transferring, bowel/bladder control, bathing, dressing, feeding, grooming, toileting, speech, swallowing and psychosocial support 5. Can the patient actively participate in an intensive therapy program of at least 3 hrs of therapy per day at least 5 days per week? Yes 6. The potential for patient to make measurable gains while on inpatient rehab is excellent 7. Anticipated functional outcomes upon discharge from inpatient rehab are supervision and min assist  with PT, supervision and min assist with OT, modified independent and supervision with SLP. 8. Estimated rehab length of stay to reach the above functional goals is: 20-30 days 9. Does the patient have adequate social supports and living environment to accommodate these discharge functional goals? Yes and Potentially 10. Anticipated D/C setting: Home 11. Anticipated post D/C treatments: [place] therapy 12. Overall Rehab/Functional Prognosis:  good  RECOMMENDATIONS: This patient's condition is appropriate for continued rehabilitative care in the following setting: CIR Patient has agreed to participate in recommended program. Yes Note that insurance prior authorization may be required for reimbursement for recommended care.  Comment: Need to confirm social supports.   Meredith Staggers, MD, [place] [facility] 10/09/2014     10/09/2014

## 2014-10-09 NOTE — Progress Notes (Signed)
PULMONARY / CRITICAL CARE MEDICINE   Name: Jamie Fields MRN: [id number] DOB: Jul 05, 1956    ADMISSION DATE:  09/17/2014 CONSULTATION DATE:  09/17/2014   REFERRING MD :  [facility]  CHIEF COMPLAINT:  Respiratory Distress  INITIAL PRESENTATION:  59 y.o. F who was taken to [facility] ED early AM hours 2/22 for respiratory distress.  She was admitted and placed on BiPAP.  Was also found to have acute renal failure, AG metabolic acidosis, Overnight, her acidosis and respiratory status worsened to the point she required intubation.  She was also placed on levophed for persistent shock.  Later in day 2/22 was transferred to [facility] for further management.  STUDIES:  CT Chest 2/22 >> no acute process CT abd / pelv 2/22 >> iliac lymph nodes likely reactive.  No acute process CXR 2/23  >> Persistent mild vascular congestion and mild interstitial edema bilaterally. Superimposed infiltrate right upper lobe and left base cannot be excluded. Echo 7/10 >> Systolic function was normal. EF 60% to 65%. Right Tib/Fib DG >> No evidence of osteo; Mild soft tissue swelling RUQ [place] 2/23 >> Liver echogenicity is diffusely increased and somewhat heterogeneous. Most likely are indicative of hepatic steatosis CT tib/fib Rt >> Negative for osteomyelitis or necrotizing fasciitis. Superficial edema and blistering CXR 2/28 >> No change in bilateral airspace disease opacities representing multifocal pneumonia versus asymmetric edema 3/1 ct head >>> neg 3/1 eeg >> slow, enceph 3/2 MRI brain >>> neg acute, sulci flare, may be related to high fio2 3/3 awake, but not moving extremities 3/3 mri neck - neg   SIGNIFICANT EVENTS: 2/22  Admitted at [place]; respiratory failure-intubated; shock; transferred to [facility] 2/23  On [place]; intubated; Fever, CXR infiltrate = sepsis 2/24  Group A strep, added [place]; CT neg for nec fas; Tube feeds started 2/26  Hypoxia, brady arrest/ PEA in pm  2/27  CVVHD initiated  2/28  Pt  net neg 6L; New CXR infiltrate - Vanc restarted 2/29- cvvhd off, urine output increased 3/4 Trach  3/5 -1 u PRBC  3/7 - Gtube placed 3/8 - transfer to SDU 3/9- failed TC, back to vent, neg 1.4 liters  SUBJECTIVE:  Agitated overnight, high respiratory rate overnight, hypokalemia.  VITAL SIGNS: Temp:  [98.2 F (36.8 C)-99.2 F (37.3 C)] 98.2 F (36.8 C) (03/15 0839) Pulse Rate:  [77-92] 92 (03/15 0839) Resp:  [12-36] 31 (03/15 0839) BP: (116-174)/(37-84) 174/84 mmHg (03/15 0839) SpO2:  [93 %-100 %] 99 % (03/15 0839) FiO2 (%):  [28 %] 28 % (03/15 0839) Weight:  [103.1 kg (227 lb 4.7 oz)] 103.1 kg (227 lb 4.7 oz) (03/15 0311)   INTAKE / OUTPUT:  Intake/Output Summary (Last 24 hours) at 10/09/14 1049 Last data filed at 10/09/14 1000  Gross per 24 hour  Intake   2410 ml  Output   1625 ml  Net    785 ml   PHYSICAL EXAMINATION: General: Obese white female, NAD Neuro: PERRL Neck: Trach site clean Head: [place]/AT Eyes: PERRL, EOM-I. Cardiovascular: RRR, no murmur, gallops, rubs.  Lungs: Lungs rhonchi.  Abdomen: Soft, non-tender, non-distended. BS +. G-tube site clean. Musculoskeletal: No gross deformities. +2 pitting edema.  Skin: Generally intact.   LABS:  CBC  Recent Labs Lab 10/07/14 0400 10/08/14 0247 10/09/14 0233  WBC 4.2 4.6 4.6  HGB 7.1* 7.3* 7.6*  HCT 22.8* 23.1* 24.1*  PLT 259 252 234   Coag's No results for input(s): APTT, INR in the last 168 hours. BMET  Recent  Labs Lab 10/07/14 0400 10/08/14 0247 10/09/14 0233  NA 143 142 144  K 3.2* 3.5 3.4*  CL 111 110 111  CO2 26 26 26   BUN 42* 41* 40*  CREATININE 0.85 0.83 0.75  GLUCOSE 146* 133* 138*   Electrolytes  Recent Labs Lab 10/05/14 0346  10/07/14 0400 10/08/14 0247 10/09/14 0233  CALCIUM 9.5  < > 9.4 9.6 9.5  MG 2.1  --   --   --  1.8  PHOS 3.5  --   --   --  3.7  < > = values in this interval not displayed.   Liver Enzymes  Recent Labs Lab 10/04/14 0648  AST 127*  ALT 126*   ALKPHOS 280*  BILITOT 0.7  ALBUMIN 2.0*    Glucose  Recent Labs Lab 10/08/14 1200 10/08/14 1640 10/08/14 1932 10/08/14 2308 10/09/14 0313 10/09/14 0805  GLUCAP 129* 131* 116* 126* 129* 122*    Imaging No results found.   ASSESSMENT / PLAN:  PULMONARY ETT 2/22 >>> 3/04 Trach (DF) 3/04 >>> A: Acute hypoxic respiratory failure 2/2 to PNA, sepsis s/p trach 3/4.  COPD without evidence of exacerbation. ARDS >> resolved. New Increased RR. P:   ATC 24/7 at this point. Titrate O2 for sat of 88-92% Speech pathology for PMV. Diureses.  CARDIOVASCULAR CVL R fem 2/22 >> 2/23 CVL LIJ 2/23 >> 3/8  A:  Shock, Septic/Hypovolemic. Remains off pressors since 3/5 HTN Adrenal Insufficiency >> resolved. Normal ECHO 09/18/14 Hypertensive overnight. P:  Increased labetalol to 200 mg PO TID with holding parameters, will increase to 300 in AM if remains hypertensive.  RENAL A:   Mixed metabolic acidosis 2/2 lactic acidosis/acute renal failure. Resolved.  Acute renal failure; resolved.  CKD stage 2; Cr at baseline (1.1) Rhabdo; resolved.  Pseudohypocalcemia; resolved.  Hypernatremia resolved. Hypokalemia New pulmonary edema, hypokalemia and hypomag. P:   Continue free water 200 ml q8 hours Lasix 40 mg IV q8 x2 doses. Replace K and mag. BMET in AM.   GASTROINTESTINAL A:  Transaminitis; resolved. GERD Obesity Severe protein calorie malnutrition Diarrhea; C. Diff NEG Off vent P:   Protonix Tube feeds Continue diet as ordered.  HEMATOLOGIC A:   Anemia of Chronic Disease; s/p PRBC x 1 on 3/3, 3/5. Hb improving DVT/PE PPx Thrombocytopenia; resolved, now thrombocytosis, likely reactive (458K) P:  Heparin [place]/SCD's  INFECTIOUS A:   GAS Cellulitis/Pneumonia w/ Septic Shock >> completed Abx 3/07 3/10 fevers, UA concerning P:   Resp 2/23 >>> RARE GROUP A STREP (S.PYOGENES) ISOLATED RVP 2/23 >> neg 3/10 urine >>  Fevers, ct ceftaz 3/10 >>3/18 Culture data  negative.  ENDOCRINE A:   Hyperglycemia P:   SSI while on tube feeds  NEUROLOGIC A:   Acute metabolic encephalopathy; resolved. H/o Depression, Anxiety, Chronic Pain. Functional quadriplegia s/p critical illness/critical care polyneuropathy; improving. MRI Brain/cervical spine 3/7 w/ no acute findings P:   Neuro following; consider EMG as an outpatient.  PT/OT  Summary - CIR recommended by PT, will consult CIR, may go tomorrow if bed is available, otherwise then SNF on Thursday if respiratory status is normalized.  Replace electrolytes and diureses today.  Rush Farmer, M.D. [facility] [place] Pulmonary/Critical Care Medicine. Pager: [phone]. After hours pager: [phone].  10/09/2014 10:49 AM

## 2014-10-09 NOTE — Progress Notes (Signed)
CSW (Clinical Education officer, museum) spoke with pt son and confirmed plan for SNF. CSW provided with bed offers. Pt son aware of potential for dc Thursday and will make choice as soon as possible.    [place], [place]

## 2014-10-10 DIAGNOSIS — I10 Essential (primary) hypertension: Secondary | ICD-10-CM

## 2014-10-10 DIAGNOSIS — J810 Acute pulmonary edema: Secondary | ICD-10-CM

## 2014-10-10 LAB — GLUCOSE, CAPILLARY
GLUCOSE-CAPILLARY: 133 mg/dL — AB (ref 70–99)
GLUCOSE-CAPILLARY: 136 mg/dL — AB (ref 70–99)
GLUCOSE-CAPILLARY: 142 mg/dL — AB (ref 70–99)
GLUCOSE-CAPILLARY: 97 mg/dL (ref 70–99)
Glucose-Capillary: 118 mg/dL — ABNORMAL HIGH (ref 70–99)
Glucose-Capillary: 100 mg/dL — ABNORMAL HIGH (ref 70–99)
Glucose-Capillary: 119 mg/dL — ABNORMAL HIGH (ref 70–99)

## 2014-10-10 LAB — CBC
HCT: 26.2 % — ABNORMAL LOW (ref 36.0–46.0)
Hemoglobin: 8.4 g/dL — ABNORMAL LOW (ref 12.0–15.0)
MCH: 28.7 pg (ref 26.0–34.0)
MCHC: 32.1 g/dL (ref 30.0–36.0)
MCV: 89.4 fL (ref 78.0–100.0)
Platelets: 275 10*3/uL (ref 150–400)
RBC: 2.93 MIL/uL — ABNORMAL LOW (ref 3.87–5.11)
RDW: 18.6 % — AB (ref 11.5–15.5)
WBC: 6.6 10*3/uL (ref 4.0–10.5)

## 2014-10-10 LAB — BASIC METABOLIC PANEL
Anion gap: 7 (ref 5–15)
BUN: 40 mg/dL — AB (ref 6–23)
CALCIUM: 9.7 mg/dL (ref 8.4–10.5)
CO2: 28 mmol/L (ref 19–32)
Chloride: 102 mmol/L (ref 96–112)
Creatinine, Ser: 0.81 mg/dL (ref 0.50–1.10)
GFR calc Af Amer: >90 mL/min (ref >90)
GFR, EST NON AFRICAN AMERICAN: 78 mL/min — AB (ref >90)
Glucose, Bld: 122 mg/dL — ABNORMAL HIGH (ref 70–99)
Potassium: 3.7 mmol/L (ref 3.5–5.1)
Sodium: 137 mmol/L (ref 135–145)

## 2014-10-10 LAB — MAGNESIUM: Magnesium: 1.9 mg/dL (ref 1.5–2.5)

## 2014-10-10 LAB — PHOSPHORUS: Phosphorus: 3.5 mg/dL (ref 2.3–4.6)

## 2014-10-10 MED ORDER — POTASSIUM CHLORIDE 20 MEQ/15ML (10%) PO SOLN
40.0000 meq | Freq: Three times a day (TID) | ORAL | Status: AC
  Administered 2014-10-10 (×2): 40 meq
  Filled 2014-10-10 (×3): qty 30

## 2014-10-10 NOTE — Progress Notes (Signed)
PULMONARY / CRITICAL CARE MEDICINE   Name: Jamie Fields MRN: [id number] DOB: 1955-09-20    ADMISSION DATE:  09/17/2014 CONSULTATION DATE:  09/17/2014   REFERRING MD :  [facility]  CHIEF COMPLAINT:  Respiratory Distress  INITIAL PRESENTATION:  59 y.o. F who was taken to [facility] ED early AM hours 2/22 for respiratory distress.  She was admitted and placed on BiPAP.  Was also found to have acute renal failure, AG metabolic acidosis, Overnight, her acidosis and respiratory status worsened to the point she required intubation.  She was also placed on levophed for persistent shock.  Later in day 2/22 was transferred to [facility] for further management.  STUDIES:  CT Chest 2/22 >> no acute process CT abd / pelv 2/22 >> iliac lymph nodes likely reactive.  No acute process CXR 2/23  >> Persistent mild vascular congestion and mild interstitial edema bilaterally. Superimposed infiltrate right upper lobe and left base cannot be excluded. Echo 9/14 >> Systolic function was normal. EF 60% to 65%. Right Tib/Fib DG >> No evidence of osteo; Mild soft tissue swelling RUQ [place] 2/23 >> Liver echogenicity is diffusely increased and somewhat heterogeneous. Most likely are indicative of hepatic steatosis CT tib/fib Rt >> Negative for osteomyelitis or necrotizing fasciitis. Superficial edema and blistering CXR 2/28 >> No change in bilateral airspace disease opacities representing multifocal pneumonia versus asymmetric edema 3/1 ct head >>> neg 3/1 eeg >> slow, enceph 3/2 MRI brain >>> neg acute, sulci flare, may be related to high fio2 3/3 awake, but not moving extremities 3/3 mri neck - neg   SIGNIFICANT EVENTS: 2/22  Admitted at [place]; respiratory failure-intubated; shock; transferred to [facility] 2/23  [place]; intubated; Fever, CXR infiltrate = sepsis 2/24  Group A strep, added Clinda; CT neg for nec fas; Tube feeds started 2/26  Hypoxia, brady arrest/ PEA in pm  2/27  CVVHD initiated  2/28  Pt  net neg 6L; New CXR infiltrate - Vanc restarted 2/29- cvvhd off, urine output increased 3/4 Trach  3/5 -1 u PRBC  3/7 - Gtube placed 3/8 - transfer to SDU 3/9- failed TC, back to vent, neg 1.4 liters  SUBJECTIVE:  Less agitated, pulmonary edema, hypoxia  VITAL SIGNS: Temp:  [97.8 F (36.6 C)-98.6 F (37 C)] 97.8 F (36.6 C) (03/16 0749) Pulse Rate:  [78-91] 91 (03/16 0857) Resp:  [18-38] 25 (03/16 0857) BP: (129-145)/(59-75) 140/59 mmHg (03/16 0749) SpO2:  [93 %-100 %] 97 % (03/16 0857) Weight:  [96.1 kg (211 lb 13.8 oz)] 96.1 kg (211 lb 13.8 oz) (03/16 0500)   INTAKE / OUTPUT:  Intake/Output Summary (Last 24 hours) at 10/10/14 1142 Last data filed at 10/10/14 0800  Gross per 24 hour  Intake   3125 ml  Output   3150 ml  Net    -25 ml   PHYSICAL EXAMINATION: General: Obese white female, NAD Neuro: Alert and interactive, moving all ext to command Neck: Trach site clean Head: [place]/AT Eyes: PERRL, EOM-I. Cardiovascular: RRR, no murmur, gallops, rubs.  Lungs: Lungs rhonchi.  Abdomen: Soft, non-tender, non-distended. BS +. G-tube site clean. Musculoskeletal: No gross deformities. +2 pitting edema.  Skin: Generally intact.   LABS:  CBC  Recent Labs Lab 10/08/14 0247 10/09/14 0233 10/10/14 0330  WBC 4.6 4.6 6.6  HGB 7.3* 7.6* 8.4*  HCT 23.1* 24.1* 26.2*  PLT 252 234 275   Coag's No results for input(s): APTT, INR in the last 168 hours. BMET  Recent Labs Lab 10/08/14 [phone]  10/09/14 0233 10/10/14 0330  NA 142 144 137  K 3.5 3.4* 3.7  CL 110 111 102  CO2 26 26 28   BUN 41* 40* 40*  CREATININE 0.83 0.75 0.81  GLUCOSE 133* 138* 122*   Electrolytes  Recent Labs Lab 10/05/14 0346  10/08/14 0247 10/09/14 0233 10/10/14 0330  CALCIUM 9.5  < > 9.6 9.5 9.7  MG 2.1  --   --  1.8 1.9  PHOS 3.5  --   --  3.7 3.5  < > = values in this interval not displayed.   Liver Enzymes  Recent Labs Lab 10/04/14 0648  AST 127*  ALT 126*  ALKPHOS 280*  BILITOT 0.7   ALBUMIN 2.0*   Glucose  Recent Labs Lab 10/09/14 1144 10/09/14 1609 10/09/14 1946 10/09/14 2356 10/10/14 0350 10/10/14 0748  GLUCAP 137* 102* 137* 97 118* 136*    Imaging No results found.   ASSESSMENT / PLAN:  PULMONARY ETT 2/22 >>> 3/04 Trach (DF) 3/04 >>> A: Acute hypoxic respiratory failure 2/2 to PNA, sepsis s/p trach 3/4.  COPD without evidence of exacerbation. ARDS >> resolved. New pulmonary edema evident compromising oxygenation this AM  P:   ATC 24/7 at this point. Titrate O2 for sat of 88-92% Speech pathology for PMV. Diureses as below. Change trach to cuffless 6 today.  CARDIOVASCULAR CVL R fem 2/22 >> 2/23 CVL LIJ 2/23 >> 3/8  A:  Shock, Septic/Hypovolemic. Remains off pressors since 3/5 HTN Adrenal Insufficiency >> resolved. Normal ECHO 09/18/14 Hypertensive overnight. P:  Increased labetalol to 200 mg PO TID with holding parameters, will increase to 300 in AM if remains hypertensive.  RENAL A:   Mixed metabolic acidosis 2/2 lactic acidosis/acute renal failure. Resolved.  Acute renal failure; resolved.  CKD stage 2; Cr at baseline (1.1) Rhabdo; resolved.  Pseudohypocalcemia; resolved.  Hypernatremia resolved now new borderline hyponatremia. Hypokalemia persistent. New pulmonary edema, hypokalemia and hypomag. P:   D/C free water Lasix 40 mg IV q8 x2 doses. Replace K and mag. BMET in AM.   GASTROINTESTINAL A:  Transaminitis; resolved. GERD Obesity Severe protein calorie malnutrition Diarrhea; C. Diff NEG Off vent P:   Protonix Tube feeds Continue diet as ordered.  HEMATOLOGIC A:   Anemia of Chronic Disease; s/p PRBC x 1 on 3/3, 3/5. Hb improving DVT/PE PPx Thrombocytopenia; resolved, now thrombocytosis, likely reactive (458K) P:  Heparin [place]/SCD's  INFECTIOUS A:   GAS Cellulitis/Pneumonia w/ Septic Shock >> completed Abx 3/07 3/10 fevers, UA concerning P:   Resp 2/23 >>> RARE GROUP A STREP (S.PYOGENES) ISOLATED RVP  2/23 >> neg 3/10 urine >>  Fevers, ct ceftaz 3/10 >>3/17 Culture data negative.  ENDOCRINE A:   Hyperglycemia P:   SSI while on tube feeds  NEUROLOGIC A:   Acute metabolic encephalopathy; resolved. H/o Depression, Anxiety, Chronic Pain. Functional quadriplegia s/p critical illness/critical care polyneuropathy; improving. MRI Brain/cervical spine 3/7 w/ no acute findings P:   Neuro following; consider EMG as an outpatient.  PT/OT  Summary - Declined by CIR, arranging for SNF currently, needs one more day of ceftaz, need to address new hyponatremia via diureses, active diureses today given new pulmonary edema overnight, change to cuffless trach today.  Rush Farmer, M.D. [facility] Pulmonary/Critical Care Medicine. Pager: [phone]. After hours pager: [phone].  10/10/2014 11:42 AM

## 2014-10-10 NOTE — Progress Notes (Signed)
CSW Armed forces technical officer) spoke with pt son Ollen Gross and provided with update on PT recommendation. CSW did briefly speak to him about CIR but explained that rehab admissions coordinator will follow up. Pt son somewhat understanding of this but confused because he thought pt could not remain in the hospital. CSW explained as best possible but once again informed pt son that admissions coordinator can answer questions as best possible. Pt son is aware that pt may still need to dc to SNF and he has chosen [facility] if this is the case. CSW to notify CIR admissions coordinator of conversation and pt son questions.  [place], [place]

## 2014-10-10 NOTE — Progress Notes (Signed)
ANTIBIOTIC CONSULT NOTE - FOLLOW UP  Pharmacy Consult for Ceftazidime Indication:   No Known Allergies  Patient Measurements: Height: 5\' 7"  (170.2 cm) Weight: 211 lb 13.8 oz (96.1 kg) IBW/kg (Calculated) : 61.6  Vital Signs: Temp: 97.8 F (36.6 C) (03/16 0749) Temp Source: Oral (03/16 0749) BP: 140/59 mmHg (03/16 0749) Pulse Rate: 91 (03/16 0857) Intake/Output from previous day: 03/15 0701 - 03/16 0700 In: 3095 [P.O.:500; NG/GT:1370; IV Piggyback:150] Out: 3150 [Urine:3150] Intake/Output from this shift: Total I/O In: 560 [NG/GT:560] Out: -   Labs:  Recent Labs  10/08/14 0247 10/09/14 0233 10/10/14 0330  WBC 4.6 4.6 6.6  HGB 7.3* 7.6* 8.4*  PLT 252 234 275  CREATININE 0.83 0.75 0.81   Estimated Creatinine Clearance: 89 mL/min (by C-G formula based on Cr of 0.81).  Assessment: 59yo female on fortaz for fever noted on 3/10 with concern of UTI. WBC= 6.6, afebrile, SCr = 0.81 and CrCl ~ 90. Noted plans for antibiotics until 3/18. She has also completed a recent antibiotic course for GAS cellulitis/PNA.   Unasyn 2/26 >> 3/2 Clinda 2/24 >> 2/26 Levaquin x1 2/24 Tamiflu 2/23 >> 2/24 Zosyn 2/23 >> 2/26 Vanc 2/23 >> 2/26; 2/28 >> 2/29; 3/ 2>> 3/4 Tressie Ellis 3/2 >> 3/7, resume 3/10>>  3/10 Urine - multiple bacteria, none predominant 3/2 BCx x2 - negative 2/29 C.diff - negative 2/28 endotracheal cx - negative 2/23 and 2/24 BCx x2 - negative 2/23 RVP - negative 2/23 Urine - negative 2/23 TA - few Candida albicans; rare GAS 2/22 MRSA PCR - neg [facility] blood - 1/2 GAS (do not conduct sensitivities)  Plan:  -Continue Ceftazidime 1g IV q8 (Consider adding a stop date of 3/18) -Will follow renal function and clinical progress  Hildred Laser, Pharm D 10/10/2014 11:08 AM

## 2014-10-10 NOTE — Progress Notes (Addendum)
NUTRITION FOLLOW UP  Intervention:   -Continue Vital AF 1.2 at 70 ml/hr via PEG to provide 2016 kcals (100% of estimated kcal needs), 126 gm protein, and 1362 ml free water daily.  Nutrition Dx:   Inadequate oral intake related to decreased appetite, dysphagia as evidenced by <5% PO intake, PEG dependent; ongoing.  Goal:   Intake to meet >90% of estimated nutrition needs; met  Monitor:   TF tolerance/adequacy, weight trend, labs.  Assessment:   Patient was transferred from [facility] to [facility] on 2/22 with respiratory distress, acute renal failure, AG metabolic acidosis, and shock.  Pt continues to tolerate TF well. Vital AF 1.2 is infusing via PEG at goal rate of 70 ml/hr, which provides 2016 kcals, 126 gm protein, and 1362 ml free water daily (2112 ml fluid with 250 ml flush TID ordered). No gastric residuals per flowsheet.  Pt s/p FEES on 10/08/14 which revealed mild oral phase dysphagia. SLP also working with PSMV trials. She has been upgraded to a dysphagia 2 diet, which she has been tolerating with minimal intake (<5%).  Per RNCM, possible d/c 10/11/14, to either SNF or LTACH, pending approval.  Labs reviewed. BUN: 40, Glucose: 122, CBGS: 97-136. K, Mg, and Phos WDL.   Height: Ht Readings from Last 1 Encounters:  09/18/14 5' 7"  (1.702 m)    Weight Status:   Wt Readings from Last 1 Encounters:  10/10/14 211 lb 13.8 oz (96.1 kg)   10/02/14 218 lb 7.6 oz (99.1 kg)   Admission weight: 245 lb 2.4 oz (111.2 kg)     Re-estimated needs:  Kcal: 1900-2100 Protein: 120-130 grams Fluid: 1.9-2.1 L  Skin: stage II pressure ulcer on buttocks  Diet Order: DIET DYS 2   Intake/Output Summary (Last 24 hours) at 10/10/14 1027 Last data filed at 10/10/14 0800  Gross per 24 hour  Intake   3245 ml  Output   3150 ml  Net     95 ml    Last BM: 10/08/14   Labs:   Recent Labs Lab 10/05/14 0346  10/08/14 0247 10/09/14 0233 10/10/14 0330  NA 149*  < > 142 144 137  K  3.4*  < > 3.5 3.4* 3.7  CL 112  < > 110 111 102  CO2 30  < > 26 26 28   BUN 55*  < > 41* 40* 40*  CREATININE 1.03  < > 0.83 0.75 0.81  CALCIUM 9.5  < > 9.6 9.5 9.7  MG 2.1  --   --  1.8 1.9  PHOS 3.5  --   --  3.7 3.5  GLUCOSE 146*  < > 133* 138* 122*  < > = values in this interval not displayed.  CBG (last 3)   Recent Labs  10/09/14 2356 10/10/14 0350 10/10/14 0748  GLUCAP 97 118* 136*    Scheduled Meds: . antiseptic oral rinse  7 mL Mouth Rinse QID  . cefTAZidime (FORTAZ)  IV  1 g Intravenous 3 times per day  . chlorhexidine  15 mL Mouth Rinse BID  . free water  250 mL Per Tube 3 times per day  . heparin subcutaneous  5,000 Units Subcutaneous 3 times per day  . insulin aspart  0-20 Units Subcutaneous 6 times per day  . labetalol  200 mg Per Tube TID  . pantoprazole sodium  40 mg Per Tube Daily    Continuous Infusions: . feeding supplement (VITAL AF 1.2 CAL) 1,000 mL (10/10/14 0800)    Jamie Fields  Rosalie Doctor, RD, LDN, CDE Pager: [phone] After hours Pager: [phone]

## 2014-10-10 NOTE — Progress Notes (Signed)
Patient lifted out of bed to chair with Maxilift in room. Patient OOB til 0300. Tolerated well. Patient stated she sleeps in recliner at home. VSS, NSR, Trach collar at 28%, sats 96%. Will continue to monitor closely.

## 2014-10-10 NOTE — Progress Notes (Signed)
Inpatient Rehabilitation  I phoned pt's son Ames Dura.  He has discussed situation with wife and they see no way to be able to provide 24 hour care in the home post rehab. He has therefore elected to continue on with SNF placement for rehab.  I have updated Debbie Dowell CM and Poonum Ambelal, SW.  I will sign off. Please call if questions.  [place] Admissions Coordinator Cell [phone] Office [phone]

## 2014-10-10 NOTE — Progress Notes (Signed)
Inpatient Rehabilitation  I met the patient at the bedside to discuss the two possible rehab options , including CIR and SNF. Pt does not qualify for LTACH currently . Pt. Appeared with mild confusion and stated that she is going home with her son today.  I phoned son, Ames Dura ([phone]) for discussion of possibility of CIR.  He stated he would prefer for pt. To come to IP rehab, however acknowleges that SNF may be best option since he and wife both long hours.  He has requested that he have time to call wife to further discuss and he plans to call me back around lunch time today if he is able to reach wife.  I await his return call to determine final plan.  I updated Elissa Hefty RN CM and Poonum Ambelal, SW.  Please call if questions.  [place] Admissions Coordinator Cell [phone] Office [phone]

## 2014-10-11 DIAGNOSIS — F419 Anxiety disorder, unspecified: Secondary | ICD-10-CM | POA: Diagnosis not present

## 2014-10-11 DIAGNOSIS — E785 Hyperlipidemia, unspecified: Secondary | ICD-10-CM | POA: Diagnosis not present

## 2014-10-11 DIAGNOSIS — A419 Sepsis, unspecified organism: Secondary | ICD-10-CM | POA: Insufficient documentation

## 2014-10-11 DIAGNOSIS — R05 Cough: Secondary | ICD-10-CM | POA: Diagnosis not present

## 2014-10-11 DIAGNOSIS — R1312 Dysphagia, oropharyngeal phase: Secondary | ICD-10-CM | POA: Diagnosis not present

## 2014-10-11 DIAGNOSIS — J301 Allergic rhinitis due to pollen: Secondary | ICD-10-CM | POA: Diagnosis not present

## 2014-10-11 DIAGNOSIS — F329 Major depressive disorder, single episode, unspecified: Secondary | ICD-10-CM | POA: Diagnosis not present

## 2014-10-11 DIAGNOSIS — R112 Nausea with vomiting, unspecified: Secondary | ICD-10-CM | POA: Diagnosis not present

## 2014-10-11 DIAGNOSIS — J45909 Unspecified asthma, uncomplicated: Secondary | ICD-10-CM | POA: Diagnosis not present

## 2014-10-11 DIAGNOSIS — I10 Essential (primary) hypertension: Secondary | ICD-10-CM | POA: Diagnosis not present

## 2014-10-11 DIAGNOSIS — M6281 Muscle weakness (generalized): Secondary | ICD-10-CM | POA: Diagnosis not present

## 2014-10-11 DIAGNOSIS — Z430 Encounter for attention to tracheostomy: Secondary | ICD-10-CM | POA: Diagnosis not present

## 2014-10-11 DIAGNOSIS — R1310 Dysphagia, unspecified: Secondary | ICD-10-CM

## 2014-10-11 DIAGNOSIS — K219 Gastro-esophageal reflux disease without esophagitis: Secondary | ICD-10-CM | POA: Diagnosis not present

## 2014-10-11 DIAGNOSIS — E46 Unspecified protein-calorie malnutrition: Secondary | ICD-10-CM | POA: Diagnosis not present

## 2014-10-11 DIAGNOSIS — J9621 Acute and chronic respiratory failure with hypoxia: Secondary | ICD-10-CM | POA: Diagnosis not present

## 2014-10-11 DIAGNOSIS — Z930 Tracheostomy status: Secondary | ICD-10-CM | POA: Diagnosis not present

## 2014-10-11 DIAGNOSIS — F330 Major depressive disorder, recurrent, mild: Secondary | ICD-10-CM | POA: Diagnosis not present

## 2014-10-11 DIAGNOSIS — J9601 Acute respiratory failure with hypoxia: Secondary | ICD-10-CM | POA: Diagnosis not present

## 2014-10-11 DIAGNOSIS — J9600 Acute respiratory failure, unspecified whether with hypoxia or hypercapnia: Secondary | ICD-10-CM | POA: Diagnosis not present

## 2014-10-11 DIAGNOSIS — F339 Major depressive disorder, recurrent, unspecified: Secondary | ICD-10-CM | POA: Diagnosis not present

## 2014-10-11 DIAGNOSIS — J449 Chronic obstructive pulmonary disease, unspecified: Secondary | ICD-10-CM | POA: Diagnosis not present

## 2014-10-11 LAB — CBC
HCT: 26.3 % — ABNORMAL LOW (ref 36.0–46.0)
Hemoglobin: 8.4 g/dL — ABNORMAL LOW (ref 12.0–15.0)
MCH: 28.7 pg (ref 26.0–34.0)
MCHC: 31.9 g/dL (ref 30.0–36.0)
MCV: 89.8 fL (ref 78.0–100.0)
Platelets: 306 10*3/uL (ref 150–400)
RBC: 2.93 MIL/uL — ABNORMAL LOW (ref 3.87–5.11)
RDW: 19.1 % — ABNORMAL HIGH (ref 11.5–15.5)
WBC: 5.5 10*3/uL (ref 4.0–10.5)

## 2014-10-11 LAB — BASIC METABOLIC PANEL
Anion gap: 9 (ref 5–15)
BUN: 40 mg/dL — ABNORMAL HIGH (ref 6–23)
CO2: 25 mmol/L (ref 19–32)
Calcium: 9.7 mg/dL (ref 8.4–10.5)
Chloride: 103 mmol/L (ref 96–112)
Creatinine, Ser: 0.75 mg/dL (ref 0.50–1.10)
GFR calc non Af Amer: >90 mL/min (ref >90)
Glucose, Bld: 108 mg/dL — ABNORMAL HIGH (ref 70–99)
Potassium: 4 mmol/L (ref 3.5–5.1)
Sodium: 137 mmol/L (ref 135–145)

## 2014-10-11 LAB — GLUCOSE, CAPILLARY
GLUCOSE-CAPILLARY: 113 mg/dL — AB (ref 70–99)
Glucose-Capillary: 121 mg/dL — ABNORMAL HIGH (ref 70–99)
Glucose-Capillary: 121 mg/dL — ABNORMAL HIGH (ref 70–99)

## 2014-10-11 LAB — MAGNESIUM: Magnesium: 1.8 mg/dL (ref 1.5–2.5)

## 2014-10-11 LAB — PHOSPHORUS: Phosphorus: 4 mg/dL (ref 2.3–4.6)

## 2014-10-11 MED ORDER — INSULIN ASPART 100 UNIT/ML ~~LOC~~ SOLN: 0.0000 [IU] | SUBCUTANEOUS | Status: DC

## 2014-10-11 MED ORDER — VITAL AF 1.2 CAL PO LIQD: ORAL | Status: DC

## 2014-10-11 MED ORDER — LABETALOL HCL 200 MG PO TABS: 200.0000 mg | ORAL_TABLET | Freq: Three times a day (TID) | ORAL | Status: DC

## 2014-10-11 MED ORDER — FREE WATER: 250.0000 mL | Freq: Three times a day (TID) | Status: DC

## 2014-10-11 MED ORDER — IPRATROPIUM-ALBUTEROL 0.5-2.5 (3) MG/3ML IN SOLN: 3.0000 mL | RESPIRATORY_TRACT | Status: AC | PRN

## 2014-10-11 MED ORDER — LOPERAMIDE HCL 1 MG/5ML PO LIQD: 4.0000 mg | ORAL | Status: AC | PRN

## 2014-10-11 MED ORDER — FREE WATER: 100.0000 mL | Freq: Three times a day (TID) | Status: DC

## 2014-10-11 MED ORDER — ACETAMINOPHEN 160 MG/5ML PO SOLN: 650.0000 mg | Freq: Four times a day (QID) | ORAL | Status: DC | PRN

## 2014-10-11 MED ORDER — PANTOPRAZOLE SODIUM 40 MG PO PACK: 40.0000 mg | PACK | Freq: Every day | ORAL | Status: DC

## 2014-10-11 NOTE — Progress Notes (Signed)
OT Cancellation Note  Patient Details Name: Jamie Fields MRN: [id number] DOB: Jul 27, 1956   Cancelled Treatment:    Reason Eval/Treat Not Completed: Fatigue/lethargy limiting ability to participate, pt up in chair several hours earlier and now sleeping soundly. Will continue to follow.    Malka So 10/11/2014, 10:38 AM

## 2014-10-11 NOTE — Progress Notes (Signed)
PT Cancellation Note  Patient Details Name: Jamie Fields MRN: [id number] DOB: 02/05/1956   Cancelled Treatment:    Reason Eval/Treat Not Completed: Fatigue/lethargy limiting ability to participate (Pt fatigued after sitting up for several hours earlier.)   [name] 10/11/2014, 10:37 AM

## 2014-10-11 NOTE — Progress Notes (Signed)
Speech Language Pathology Treatment: Dysphagia  Patient Details Name: Jamie Fields MRN: [id number] DOB: Jun 03, 1956 Today's Date: 10/11/2014 Time: 2025-4270 SLP Time Calculation (min) (ACUTE ONLY): 28 min  Assessment / Plan / Recommendation Clinical Impression  Pt wore PMV valve for 28 minutes during PO intake without evidence of air trapping. Pt's vocal quality has improved since last session; more intense and clear as a result of improve respiratory support for speech. O2 stats remained at 99. PO trials with thin liquids and solid consistencies did not elicit s/s of aspiration. Discussed with pt to avoid using PMV when sleeping. Recommend pt wear PMV during all waking hours and during PO intake with close intermittent supervision; remove PMV when sleeping. Pt ate more PO's than last seen in prior session; tolerating diet well; suspect resulting from improved respiratory support. Recommend pt continue Dys 2/ thin liquid diet. Speech will follow-up for diet check, possible upgrade and PMV treatment.    HPI HPI: 59 y.o. F who was taken to [facility] ED early AM hours 2/22 for respiratory distress intubated and transferred to [facility]. 2/26 bradycardia with PEA. Pt with renal failure, shock, encephalopathy, polyneuropathy, CVVHD 2/27-2/29, ETT 2/22-3/04; trach 3/4; Gtube 3/7.     Pertinent Vitals    SLP Plan  Continue with current plan of care    Recommendations Diet recommendations: Dysphagia 2 (fine chop);Thin liquid Liquids provided via: Cup;Straw Medication Administration: Whole meds with puree Supervision: Staff to assist with self feeding;Full supervision/cueing for compensatory strategies Compensations: Slow rate;Small sips/bites      Patient may use Passy-Muir Speech Valve: Intermittently with supervision;During PO intake/meals       Oral Care Recommendations: Oral care BID Follow up Recommendations: Skilled Nursing facility;Inpatient Rehab Plan: Continue with current plan of care     GO     Bermudez-Bosch, Adron Bene 10/11/2014, 10:11 AM

## 2014-10-11 NOTE — Discharge Summary (Signed)
Physician Discharge Summary  Patient ID: Jamie Fields MRN: [id number] DOB/AGE: 1956/07/20 59 y.o.  Admit date: 09/17/2014 Discharge date: 10/11/2014    Discharge Diagnoses:  Principal Problem:   Acute respiratory failure with hypoxia Active Problems:   Shock   Acute renal failure   Obesity hypoventilation syndrome   Elevated LFTs   Encounter for central line placement   Cellulitis   Endotracheally intubated   Altered mental state   ARDS (adult respiratory distress syndrome)   Cardiac arrest   Acute respiratory failure   Anoxia   Protein-calorie malnutrition   Osteomyelitis   Pneumonia   Quadriplegia   Dysphagia   Quadriparesis   Tracheostomy status   Sepsis   Brief Summary:    Jamie Fields is a 59 y.o. y/o female with a PMH of HTN, asthma, COPD, depression, GERD who initially presented 2/22 to [facility] with SOB, n/v, fever.  She was admitted for hypoxic respiratory failure and acute renal failure, placed on bipap but failed, requiring intubation and was tx to [facility].  Chest x-ray showed cardiac enlargement with vascular congestion and bilateral effusions. CT of the chest showed no acute process. CT abdomen and pelvis unremarkable. Echocardiogram was systolic function normal ejection fraction 65%. Complaints of pain to the right tibia-fibula no evidence of osteomyelitis there was some mild soft tissue swelling. Bouts of confusion altered mental status with cranial CT/MRI scan negative. EEG showed no seizure with generalized slowing suspect encephalopathy.  She was treated for AECOPD, given empiric IV abx and fluids for suspected hypovolemia.  Subsequent CXR did reveal infiltrate and she was continued on abx.  On 2/26 she suffered a hypoxic/bradycardic arrest and a second PEA arrest that evening.  She required CVVH for worsening renal failure from 2/27-2/29.  Despite multiple attempts at weaning she required tracheostomy placement 3/4 and is now tolerating ATC  24/7.  Throughout admission her course has been complicated by renal failure, fever, volume overload/pulmonary edema and significant deconditioning/functional quadriplegia r/t critical illness polyneuropathy. She continues to improve slowly, has completed IV abx, is tolerating trach collar at all times with #6 cuffless trach, and is medically cleared for d/c to SNF.   TUBES/LINES:  ETT 2/22 >>> 3/04 Trach (DF) 3/04 >>> CVL R fem 2/22 >> 2/23 CVL LIJ 2/23 >> 3/8   MICRO/ABX:  Resp 2/23 >>> RARE GROUP A STREP (S.PYOGENES) ISOLATED 2/23 BCx2>>>neg  RVP 2/23 >> neg 2/28 sputum>>> normal flora  3/2 BCX2>>>neg  3/10 urine >>mult morph  CDiff 3/12>>>NEG ------ Levofloxacin 2/23>>>2/24 Clinda 2/24 >> 2/26 Vanc, start date 2/22>>>2/26 ---- Restarted 2/28 >>2/29 Zosyn, start date 2/22>>>2/26 Unasyn 2/26 >>off Ceftaz 3/10>>>3/17   STUDIES:  CT Chest 2/22 >> no acute process CT abd / pelv 2/22 >> iliac lymph nodes likely reactive. No acute process CXR 2/23 >> Persistent mild vascular congestion and mild interstitial edema bilaterally. Superimposed infiltrate right upper lobe and left base cannot be excluded. Echo 9/16 >> Systolic function was normal. EF 60% to 65%. Right Tib/Fib DG >> No evidence of osteo; Mild soft tissue swelling RUQ [place] 2/23 >> Liver echogenicity is diffusely increased and somewhat heterogeneous. Most likely are indicative of hepatic steatosis CT tib/fib Rt >> Negative for osteomyelitis or necrotizing fasciitis. Superficial edema and blistering CXR 2/28 >> No change in bilateral airspace disease opacities representing multifocal pneumonia versus asymmetric edema 3/1 ct head >>> neg 3/1 eeg >> slow, enceph 3/2 MRI brain >>> neg acute, sulci flare, may be related to high fio2 3/3  awake, but not moving extremities 3/3 mri neck - neg   SIGNIFICANT EVENTS: 2/22 Admitted at [place]; respiratory failure-intubated; shock; transferred to [facility] 2/23 On [place];  intubated; Fever, CXR infiltrate = sepsis 2/24 Group A strep, added Clinda; CT neg for nec fas; Tube feeds started 2/26 Hypoxia, brady arrest/ PEA in pm  2/27 CVVHD initiated  2/28 Pt net neg 6L; New CXR infiltrate - Vanc restarted 2/29- cvvhd off, urine output increased 3/4 Trach  3/5 -1 u PRBC  3/7 - Gtube placed 3/8 - transfer to SDU 3/9- failed TC, back to vent, neg 1.4 liters                                                                    D/c plan by Discharge Diagnosis  Acute hypoxic respiratory failure 2/2 to PNA, sepsis s/p trach 3/4.  COPD without evidence of exacerbation. ARDS >> resolved. New pulmonary edema evident compromising oxygenation this AM P:  ATC 24/7 Titrate O2 for sat of 88-92% Speech pathology for PMV. Cont cuffless trach  Can f/u in trach clinic   Will need ongoing speech f/u  Abx course complete   Shock, Septic/Hypovolemic. Remains off pressors since 3/5 HTN Adrenal Insufficiency >> resolved. Normal ECHO 09/18/14 HTN. P:  Continue labetalol 282m per tube TID PCP f/u    Mixed metabolic acidosis 2/2 lactic acidosis/acute renal failure. Resolved.  Acute renal failure; resolved.  CKD stage 2; Cr at baseline (1.1) Rhabdo; resolved.  Pseudohypocalcemia; resolved.  Hypernatremia resolved  Hypokalemia persistent. New pulmonary edema, hypokalemia and hypomag. P:  F/u chem closely    Transaminitis; resolved. GERD Obesity Severe protein calorie malnutrition Diarrhea; C. Diff NEG Dysphagia  P:  Cont dysphagia 2 diet per ST recs  Ongoing speech therapy  Continue TF for now with poor PO intake  Ongoing dietitian monitoring for nutrition - goal would be to take all nutrition PO  Cont PEG for now   Acute metabolic encephalopathy; resolved. H/o Depression, Anxiety, Chronic Pain. Functional quadriplegia s/p critical illness/critical care polyneuropathy; improving. MRI Brain/cervical spine 3/7 w/ no acute findings P:   Consider EMG as outpt  Ongoing PT/OT    Filed Vitals:   10/11/14 0400 10/11/14 0453 10/11/14 0736 10/11/14 0900  BP: 119/47   119/47  Pulse:   97 87  Temp:      TempSrc:      Resp:   22 22  Height:      Weight:  209 lb 14.1 oz (95.2 kg)    SpO2: 98%  98%      Discharge Labs  BMET  Recent Labs Lab 10/05/14 0346  10/07/14 0400 10/08/14 0247 10/09/14 0233 10/10/14 0330 10/11/14 0443  NA 149*  < > 143 142 144 137 137  K 3.4*  < > 3.2* 3.5 3.4* 3.7 4.0  CL 112  < > 111 110 111 102 103  CO2 30  < > _0 GLUCOSE 146*  < > 146* 133* 138* 122* 108*  BUN 55*  < > 42* 41* 40* 40* 40*  CREATININE 1.03  < > 0.85 0.83 0.75 0.81 0.75  CALCIUM 9.5  < > 9.4 9.6 9.5 9.7 9.7  MG 2.1  --   --   --  1.8 1.9 1.8  PHOS 3.5  --   --   --  3.7 3.5 4.0  < > = values in this interval not displayed.   CBC   Recent Labs Lab 10/09/14 0233 10/10/14 0330 10/11/14 0443  HGB 7.6* 8.4* 8.4*  HCT 24.1* 26.2* 26.3*  WBC 4.6 6.6 5.5  PLT 234 275 306   Anti-Coagulation No results for input(s): INR in the last 168 hours.           Follow-up Information    Follow up with [facility] On 11/07/2014.   Specialty:  Respiratory Therapy   Why:  trach clinic -- 12:30pm    Contact information:   [address] [id number] [place] [phone]      Follow up with Damita Dunnings, NP. Call in 1 week.   Contact information:   [address] [place] [address] [phone]          Medication List    STOP taking these medications        amitriptyline 25 MG tablet  Commonly known as:  ELAVIL     DULoxetine 30 MG capsule  Commonly known as:  CYMBALTA     esomeprazole 40 MG capsule  Commonly known as:  NEXIUM     gabapentin 800 MG tablet  Commonly known as:  NEURONTIN     ibuprofen 200 MG tablet  Commonly known as:  ADVIL,MOTRIN     Isopropyl Alcohol 95 % Liqd     meclizine  25 MG tablet  Commonly known as:  ANTIVERT     morphine 15 MG 12 hr tablet  Commonly known as:  MS CONTIN     OPANA ER (CRUSH RESISTANT) 10 MG T12a 12 hr tablet  Generic drug:  oxymorphone     oxyCODONE-acetaminophen 10-325 MG per tablet  Commonly known as:  PERCOCET     oxymetazoline 0.05 % nasal spray  Commonly known as:  AFRIN     phenylephrine 0.5 % nasal solution  Commonly known as:  NEO-SYNEPHRINE     Phenylephrine-Guaifenesin 5-200 MG Tabs     tamsulosin 0.4 MG Caps capsule  Commonly known as:  FLOMAX     traMADol 50 MG tablet  Commonly known as:  ULTRAM      TAKE these medications        acetaminophen 160 MG/5ML solution  Commonly known as:  TYLENOL  Take 20.3 mLs (650 mg total) by mouth every 6 (six) hours as needed for mild pain or fever.     albuterol (2.5 MG/3ML) 0.083% nebulizer solution  Commonly known as:  PROVENTIL  Take 2.5 mg by nebulization every 4 (four) hours as needed for wheezing or shortness of breath.     atorvastatin 10 MG tablet  Commonly known as:  LIPITOR  Take 10 mg by mouth at bedtime.     diclofenac 75 MG EC tablet  Commonly known as:  VOLTAREN  Take 75 mg by mouth 2 (two) times daily as needed (pain).     feeding supplement (VITAL AF 1.2 CAL) Liqd  71m/hr     insulin aspart 100 UNIT/ML injection  Commonly known as:  novoLOG  Inject 0-20 Units into the skin every 4 (four) hours.     ipratropium-albuterol 0.5-2.5 (3) MG/3ML Soln  Commonly known as:  DUONEB  Take 3 mLs by nebulization every 2 (two) hours as needed.     labetalol 200 MG tablet  Commonly known as:  NORMODYNE  Place 1 tablet (200 mg total) into feeding tube 3 (three) times daily.     loperamide 1 MG/5ML solution  Commonly known as:  IMODIUM  Take 20 mLs (4 mg total) by mouth as needed for diarrhea or loose stools.     ondansetron 8 MG tablet  Commonly known as:  ZOFRAN  Take 8 mg by mouth 3 (three) times daily as needed for nausea or vomiting.      pantoprazole sodium 40 mg/20 mL Pack  Commonly known as:  PROTONIX  Place 20 mLs (40 mg total) into feeding tube daily.     sertraline 100 MG tablet  Commonly known as:  ZOLOFT  Take 200 mg by mouth daily.     SUMAtriptan 50 MG tablet  Commonly known as:  IMITREX  Take 50 mg by mouth every 2 (two) hours as needed for migraine. Do not exceed 200 mg in 24 hours          Disposition: SNF   Discharged Condition: Jamie Fields has met maximum benefit of inpatient care and is medically stable and cleared for discharge.  Patient is pending follow up as above.      Time spent on disposition:  Greater than 35 minutes.   SignedDarlina Sicilian, NP 10/11/2014  11:35 AM Pager: [phone] or [phone]  *Care during the described time interval was provided by me and/or other providers on the critical care team. I have reviewed this patient's available data, including medical history, events of note, physical examination and test results as part of my evaluation.

## 2014-10-11 NOTE — Progress Notes (Signed)
Son Jamie Fields made aware about the transfer to [facility] care at 2 pm today.

## 2014-10-11 NOTE — Progress Notes (Signed)
Discharged to [facility] via EMS, transfer records given to the staff,  On  o2 28 % trache collar with pmv in used. Belongings with pt.  Peg tube clamped. Report given to nurse.

## 2014-10-15 DIAGNOSIS — I10 Essential (primary) hypertension: Secondary | ICD-10-CM | POA: Diagnosis not present

## 2014-10-15 DIAGNOSIS — J9621 Acute and chronic respiratory failure with hypoxia: Secondary | ICD-10-CM | POA: Diagnosis not present

## 2014-10-15 DIAGNOSIS — F330 Major depressive disorder, recurrent, mild: Secondary | ICD-10-CM | POA: Diagnosis not present

## 2014-10-22 ENCOUNTER — Telehealth: Payer: Self-pay

## 2014-10-22 DIAGNOSIS — F339 Major depressive disorder, recurrent, unspecified: Secondary | ICD-10-CM | POA: Diagnosis not present

## 2014-10-22 DIAGNOSIS — M6281 Muscle weakness (generalized): Secondary | ICD-10-CM | POA: Diagnosis not present

## 2014-10-22 DIAGNOSIS — F419 Anxiety disorder, unspecified: Secondary | ICD-10-CM | POA: Diagnosis not present

## 2014-10-22 NOTE — Patient Outreach (Signed)
CSW called patient but was only able to leave voicemail. CSW called [employer] where patient is located and left a message with discharge planner. CSW made plans to visit tomorrow and assess patient for needs post-discharge.

## 2014-10-23 ENCOUNTER — Other Ambulatory Visit: Payer: Self-pay

## 2014-10-23 ENCOUNTER — Ambulatory Visit: Payer: Self-pay

## 2014-10-23 DIAGNOSIS — R05 Cough: Secondary | ICD-10-CM | POA: Diagnosis not present

## 2014-10-23 DIAGNOSIS — J9621 Acute and chronic respiratory failure with hypoxia: Secondary | ICD-10-CM | POA: Diagnosis not present

## 2014-10-23 DIAGNOSIS — J449 Chronic obstructive pulmonary disease, unspecified: Secondary | ICD-10-CM | POA: Diagnosis not present

## 2014-10-23 NOTE — Patient Outreach (Signed)
  [facility]) Care Management  [facility] Social Work  10/23/2014  Jamie Fields 06-Jun-1956 [id number]  Subjective:    Objective:   Current Medications:  Current Outpatient Prescriptions  Medication Sig Dispense Refill  . acetaminophen (TYLENOL) 160 MG/5ML solution Take 20.3 mLs (650 mg total) by mouth every 6 (six) hours as needed for mild pain or fever. 120 mL 0  . albuterol (PROVENTIL) (2.5 MG/3ML) 0.083% nebulizer solution Take 2.5 mg by nebulization every 4 (four) hours as needed for wheezing or shortness of breath.     Marland Kitchen atorvastatin (LIPITOR) 10 MG tablet Take 10 mg by mouth at bedtime.     . diclofenac (VOLTAREN) 75 MG EC tablet Take 75 mg by mouth 2 (two) times daily as needed (pain).     . insulin aspart (NOVOLOG) 100 UNIT/ML injection Inject 0-20 Units into the skin every 4 (four) hours. 10 mL 11  . ipratropium-albuterol (DUONEB) 0.5-2.5 (3) MG/3ML SOLN Take 3 mLs by nebulization every 2 (two) hours as needed. 360 mL   . labetalol (NORMODYNE) 200 MG tablet Place 1 tablet (200 mg total) into feeding tube 3 (three) times daily.    Marland Kitchen loperamide (IMODIUM) 1 MG/5ML solution Take 20 mLs (4 mg total) by mouth as needed for diarrhea or loose stools. 120 mL 0  . Nutritional Supplements (FEEDING SUPPLEMENT, VITAL AF 1.2 CAL,) LIQD 42m/hr    . ondansetron (ZOFRAN) 8 MG tablet Take 8 mg by mouth 3 (three) times daily as needed for nausea or vomiting.    . pantoprazole sodium (PROTONIX) 40 mg/20 mL PACK Place 20 mLs (40 mg total) into feeding tube daily. 30 each   . sertraline (ZOLOFT) 100 MG tablet Take 200 mg by mouth daily.     . SUMAtriptan (IMITREX) 50 MG tablet Take 50 mg by mouth every 2 (two) hours as needed for migraine. Do not exceed 200 mg in 24 hours     No current facility-administered medications for this visit.    Functional Status:  In your present state of health, do you have any difficulty performing the following activities: 10/23/2014  Is the patient  deaf or have difficulty hearing? N  Hearing N  Vision N  Difficulty concentrating or making decisions Y  Walking or climbing stairs? Y  Doing errands, shopping? Y  Preparing Food and eating ? Y  Using the Toilet? Y  In the past six months, have you accidently leaked urine? Y  Do you have problems with loss of bowel control? N  Managing your Medications? Y  Managing your Finances? Y  Housekeeping or managing your Housekeeping? Y    Fall/Depression Screening:  PHQ 2/9 Scores 10/23/2014  Exception Documentation Patient refusal    Assessment: CSW met with patient at SNF. CSW assessed patient for needs for post discharge. Patient denied need for help with transportation, depression, or financial. CSW discussed advanced directives which patient expressed interest in learning more about. CS[facility] information and [facility] as well as provided written consent form and information.   Plan: CSW will follow-up in two weeks to determine patient's discharge if patient will be long-term or will be discharged. CSW will follow-up with Son to help arranged advanced directives.

## 2014-10-31 NOTE — Patient Outreach (Signed)
  [facility]) Care Management  [facility] Social Work  10/31/2014  Jamie Fields 11/10/1955 [id number]  Subjective:    Objective:   Current Medications:  Current Outpatient Prescriptions  Medication Sig Dispense Refill  . acetaminophen (TYLENOL) 160 MG/5ML solution Take 20.3 mLs (650 mg total) by mouth every 6 (six) hours as needed for mild pain or fever. 120 mL 0  . albuterol (PROVENTIL) (2.5 MG/3ML) 0.083% nebulizer solution Take 2.5 mg by nebulization every 4 (four) hours as needed for wheezing or shortness of breath.     Marland Kitchen atorvastatin (LIPITOR) 10 MG tablet Take 10 mg by mouth at bedtime.     . diclofenac (VOLTAREN) 75 MG EC tablet Take 75 mg by mouth 2 (two) times daily as needed (pain).     . insulin aspart (NOVOLOG) 100 UNIT/ML injection Inject 0-20 Units into the skin every 4 (four) hours. 10 mL 11  . ipratropium-albuterol (DUONEB) 0.5-2.5 (3) MG/3ML SOLN Take 3 mLs by nebulization every 2 (two) hours as needed. 360 mL   . labetalol (NORMODYNE) 200 MG tablet Place 1 tablet (200 mg total) into feeding tube 3 (three) times daily.    Marland Kitchen loperamide (IMODIUM) 1 MG/5ML solution Take 20 mLs (4 mg total) by mouth as needed for diarrhea or loose stools. 120 mL 0  . Nutritional Supplements (FEEDING SUPPLEMENT, VITAL AF 1.2 CAL,) LIQD 36ml/hr    . ondansetron (ZOFRAN) 8 MG tablet Take 8 mg by mouth 3 (three) times daily as needed for nausea or vomiting.    . pantoprazole sodium (PROTONIX) 40 mg/20 mL PACK Place 20 mLs (40 mg total) into feeding tube daily. 30 each   . sertraline (ZOLOFT) 100 MG tablet Take 200 mg by mouth daily.     . SUMAtriptan (IMITREX) 50 MG tablet Take 50 mg by mouth every 2 (two) hours as needed for migraine. Do not exceed 200 mg in 24 hours     No current facility-administered medications for this visit.    Functional Status:  In your present state of health, do you have any difficulty performing the following activities: 10/23/2014  Is the patient  deaf or have difficulty hearing? N  Hearing N  Vision N  Difficulty concentrating or making decisions Y  Walking or climbing stairs? Y  Doing errands, shopping? Y  Preparing Food and eating ? Y  Using the Toilet? Y  In the past six months, have you accidently leaked urine? Y  Do you have problems with loss of bowel control? N  Managing your Medications? Y  Managing your Finances? Y  Housekeeping or managing your Housekeeping? Y    Fall/Depression Screening:  PHQ 2/9 Scores 10/23/2014  Exception Documentation Patient refusal    Assessment: CSW received consent form from patient.   Plan: CSW will enter consent form with next encounter.

## 2014-11-06 ENCOUNTER — Other Ambulatory Visit: Payer: Commercial Managed Care - HMO

## 2014-11-06 ENCOUNTER — Telehealth: Payer: Self-pay

## 2014-11-06 NOTE — Patient Outreach (Signed)
[facility] Care Management  11/06/2014  ANGELIS GATES [birth year] [id number]   CSW called over to [employer]. Patient was not available to respond. Krisiti, Education officer, museum at [employer], responds that patient has a trach procedure this week and patient's shot term or long-terms status will be decided soon. CSW scheduled a visit to facility with social worker to follow-up on goals and determine if patient will be discharged.

## 2014-11-07 ENCOUNTER — Ambulatory Visit (HOSPITAL_COMMUNITY)
Admit: 2014-11-07 | Discharge: 2014-11-07 | Disposition: A | Discharge status: Home / Self Care | Payer: Commercial Managed Care - HMO | Source: Ambulatory Visit | Attending: Internal Medicine | Admitting: Internal Medicine

## 2014-11-07 DIAGNOSIS — J449 Chronic obstructive pulmonary disease, unspecified: Secondary | ICD-10-CM | POA: Insufficient documentation

## 2014-11-07 DIAGNOSIS — J45909 Unspecified asthma, uncomplicated: Secondary | ICD-10-CM | POA: Insufficient documentation

## 2014-11-07 DIAGNOSIS — R05 Cough: Secondary | ICD-10-CM | POA: Diagnosis not present

## 2014-11-07 DIAGNOSIS — I10 Essential (primary) hypertension: Secondary | ICD-10-CM | POA: Diagnosis not present

## 2014-11-07 DIAGNOSIS — J301 Allergic rhinitis due to pollen: Secondary | ICD-10-CM | POA: Diagnosis not present

## 2014-11-07 DIAGNOSIS — K219 Gastro-esophageal reflux disease without esophagitis: Secondary | ICD-10-CM | POA: Insufficient documentation

## 2014-11-07 DIAGNOSIS — R1310 Dysphagia, unspecified: Secondary | ICD-10-CM | POA: Insufficient documentation

## 2014-11-07 DIAGNOSIS — Z430 Encounter for attention to tracheostomy: Secondary | ICD-10-CM | POA: Insufficient documentation

## 2014-11-07 NOTE — Progress Notes (Signed)
Tracheostomy Procedure Note  Jamie Fields [id number] 09/26/1955  Pre Procedure Tracheostomy Information  Trach Brand: Shiley Size: 6 Style: Uncuffed Secured by: Velcro  Vitals: HR-81, RR-18, BP-133/60, SpO2-100% on RA with PMV   Procedure: Decannulation   Post Procedure Evaluation:  Vital signs:blood pressure 144/72, pulse 79, respirations 18 and pulse oximetry 100% Patients current condition: stable Complications: No apparent complications Trach site exam: clean, dry, no drainage Wound care done: Vaseline gauze Patient did tolerate procedure well.   Education: Keep current dressing x 48 hours After 48 hours use regular gauze Do not submerge under water until stoma completely closed Resume prior activity

## 2014-11-07 NOTE — Progress Notes (Signed)
CC Trach clinic establishment/ possible decannulation    Brief History  59 y.o. y/o female with a PMH of HTN, asthma, COPD, depression, GERD who initially presented 2/22 to [facility] with SOB, n/v, fever. She was admitted for hypoxic respiratory failure and acute renal failure, placed on bipap but failed, requiring intubation and was tx to [facility]. She was treated for AECOPD &  Hypovolemia. Course c/b encephalopathy.On 2/26 she suffered a hypoxic/bradycardic arrest and a second PEA arrest that evening, fever, volume overload/pulmonary edema and significant deconditioning/functional quadriplegia r/t critical illness polyneuropathy. She continued to improve slowly, has completed IV abx and was d/c to SNF w/ #6 cuffless trach which was placed on 3/4. She presents today reporting that she is doing well. Denies dyspnea, using PMV almost 24/7. Excellent phonation. Cough w/ clear sputum. Anxious to have trach removed.    Past Medical History  Diagnosis Date  . COPD (chronic obstructive pulmonary disease)   . GERD (gastroesophageal reflux disease)   . Asthma   . HTN (hypertension)   . Depression   . Anxiety    Family History  Problem Relation Age of Onset  . Hyperlipidemia Mother   . Hypertension Father    No Known Allergies History   Social History  . Marital Status: Widowed    Spouse Name: N/A  . Number of Children: N/A  . Years of Education: N/A   Occupational History  . Not on file.   Social History Main Topics  . Smoking status: Not on file  . Smokeless tobacco: Not on file  . Alcohol Use: Not on file  . Drug Use: Not on file  . Sexual Activity: Not on file   Other Topics Concern  . Not on file   Social History Narrative   @[employer] @  Review of Systems:   Bolds are positive  Constitutional: weight loss, gain, night sweats, Fevers, chills, fatigue .  HEENT: headaches, Sore throat, sneezing, nasal congestion, post nasal drip, Difficulty swallowing,  Tooth/dental problems, visual complaints visual changes, ear ache CV:  chest pain, radiates: ,Orthopnea, PND, swelling in lower extremities, dizziness, palpitations, syncope.  GI  heartburn, indigestion, abdominal pain, nausea, vomiting, diarrhea, change in bowel habits, loss of appetite, bloody stools.  Resp: cough, productive: , hemoptysis, dyspnea, chest pain, pleuritic.  Skin: rash or itching or icterus GU: dysuria, change in color of urine, urgency or frequency. flank pain, hematuria  MS: joint pain or swelling. decreased range of motion  Psych: change in mood or affect. depression or anxiety.  Neuro: difficulty with speech, weakness, numbness, ataxia   There were no vitals taken for this visit.  General appearance: 59 Year old female   Mouth:  membranes and no mucosal ulcerations; normal hard and soft palate Neck: Trachea midline, # 6 trach, cuffless trach; stoma unremarkable .  Lungs: CTA, with normal respiratory effort and no intercostal retractions CV: RRR, no MRGs  Abdomen: Soft, non-tender; no masses or HSM Extremities: No peripheral edema or extremity lymphadenopathy Skin: Normal temperature, turgor and texture; no rash, ulcers or subcutaneous nodules Psych: Appropriate affect, alert and oriented to person, place and time  Date  Last Seen by:  Lurline Idol size Trach change   pmv  Capping  Stoma assessment  Diet  Goals   3/17 yacoub 6 3/16     This was prior to d/c. She was changed to 6 cuffless   4/13 Aryelle Figg 6  yes  clean Dysphagia w/ thickened fluid Decannulated.  Impression/plan  1) resolved tracheostomy status s/p prolonged Critical illness  2) Deconditioning s/p prolonged critical illness in setting of presumed critical care polyneuropathy  3) COPD w/ h/o asthma 4) GERD 5) dysphagia   Discussion Did excellent w/ finger occlusion and phonated well. Also able to cough well w/ finger occlusion, therefore given her progress and her ability to protect airway we  went ahead and decannulated her.   Plan Keep current dressing x48hrs; then may use regular band-aid Do not go under water until stoma closed She was advised to continue her current dietary restrictions, however removal of the trach may help her swallowing fxn. She has a planned repeat swallow eval later today.  Can see [place] as needed.   Erick Colace ACNP-BC [place] Pager # [phone] OR # [phone] if no answer

## 2014-11-12 ENCOUNTER — Other Ambulatory Visit: Payer: Self-pay

## 2014-11-12 NOTE — Patient Outreach (Signed)
[facility] ([facility]) Care Management  [facility] Social Work  11/12/2014  Jamie Fields 09/08/1955 [id number]  Subjective:    Objective:   Current Medications:  Current Outpatient Prescriptions  Medication Sig Dispense Refill  . acetaminophen (TYLENOL) 160 MG/5ML solution Take 20.3 mLs (650 mg total) by mouth every 6 (six) hours as needed for mild pain or fever. 120 mL 0  . albuterol (PROVENTIL) (2.5 MG/3ML) 0.083% nebulizer solution Take 2.5 mg by nebulization every 4 (four) hours as needed for wheezing or shortness of breath.     . atorvastatin (LIPITOR) 10 MG tablet Take 10 mg by mouth at bedtime.     . diclofenac (VOLTAREN) 75 MG EC tablet Take 75 mg by mouth 2 (two) times daily as needed (pain).     . insulin aspart (NOVOLOG) 100 UNIT/ML injection Inject 0-20 Units into the skin every 4 (four) hours. 10 mL 11  . ipratropium-albuterol (DUONEB) 0.5-2.5 (3) MG/3ML SOLN Take 3 mLs by nebulization every 2 (two) hours as needed. 360 mL   . labetalol (NORMODYNE) 200 MG tablet Place 1 tablet (200 mg total) into feeding tube 3 (three) times daily.    . loperamide (IMODIUM) 1 MG/5ML solution Take 20 mLs (4 mg total) by mouth as needed for diarrhea or loose stools. 120 mL 0  . Nutritional Supplements (FEEDING SUPPLEMENT, VITAL AF 1.2 CAL,) LIQD 70ml/hr    . ondansetron (ZOFRAN) 8 MG tablet Take 8 mg by mouth 3 (three) times daily as needed for nausea or vomiting.    . pantoprazole sodium (PROTONIX) 40 mg/20 mL PACK Place 20 mLs (40 mg total) into feeding tube daily. 30 each   . sertraline (ZOLOFT) 100 MG tablet Take 200 mg by mouth daily.     . SUMAtriptan (IMITREX) 50 MG tablet Take 50 mg by mouth every 2 (two) hours as needed for migraine. Do not exceed 200 mg in 24 hours     No current facility-administered medications for this visit.    Functional Status:  In your present state of health, do you have any difficulty performing the following activities: 10/23/2014  Hearing? N   Vision? N  Difficulty concentrating or making decisions? N  Walking or climbing stairs? Y  Dressing or bathing? Y  Doing errands, shopping? Y  Preparing Food and eating ? Y  Using the Toilet? Y  In the past six months, have you accidently leaked urine? Y  Do you have problems with loss of bowel control? N  Managing your Medications? Y  Managing your Finances? Y  Housekeeping or managing your Housekeeping? Y    Fall/Depression Screening:  PHQ 2/9 Scores 10/23/2014  Exception Documentation Patient refusal    Assessment: CSW met with patient at SNF. Patient reports she will be discharged on 4/21. She reports she plans on working with social worker at SNF to get hospital bed and shower chair. She is provided with another advanced directive copy which she will work with her son on completing. She is provided with information on [employer] and Transportation programs and is given contact information. CSW discussed [employer] program and patient reports she is interested in ongoing care management. CSW made plans to follow-up with patient in two weeks and after patient is discharged to assess needs at that time. Patient denies the need for nurse CM or pharmacy assistance. She reports she needs no educational assistance but will consider financial assessment after discharge.   Plan: CSW will contact [employer] to   determine if patient is eligible for services and if not then help patient become eligible if possible. CSW will determine if financial assessment is needed to help with medical equipment. CSW will follow-up in two weeks via phone.   

## 2014-11-17 DIAGNOSIS — J45909 Unspecified asthma, uncomplicated: Secondary | ICD-10-CM | POA: Diagnosis not present

## 2014-11-17 DIAGNOSIS — N182 Chronic kidney disease, stage 2 (mild): Secondary | ICD-10-CM | POA: Diagnosis not present

## 2014-11-17 DIAGNOSIS — M1990 Unspecified osteoarthritis, unspecified site: Secondary | ICD-10-CM | POA: Diagnosis not present

## 2014-11-17 DIAGNOSIS — Z431 Encounter for attention to gastrostomy: Secondary | ICD-10-CM | POA: Diagnosis not present

## 2014-11-17 DIAGNOSIS — F339 Major depressive disorder, recurrent, unspecified: Secondary | ICD-10-CM | POA: Diagnosis not present

## 2014-11-17 DIAGNOSIS — I129 Hypertensive chronic kidney disease with stage 1 through stage 4 chronic kidney disease, or unspecified chronic kidney disease: Secondary | ICD-10-CM | POA: Diagnosis not present

## 2014-11-17 DIAGNOSIS — R1312 Dysphagia, oropharyngeal phase: Secondary | ICD-10-CM | POA: Diagnosis not present

## 2014-11-17 DIAGNOSIS — J449 Chronic obstructive pulmonary disease, unspecified: Secondary | ICD-10-CM | POA: Diagnosis not present

## 2014-11-19 DIAGNOSIS — J45909 Unspecified asthma, uncomplicated: Secondary | ICD-10-CM | POA: Diagnosis not present

## 2014-11-19 DIAGNOSIS — J449 Chronic obstructive pulmonary disease, unspecified: Secondary | ICD-10-CM | POA: Diagnosis not present

## 2014-11-19 DIAGNOSIS — Z431 Encounter for attention to gastrostomy: Secondary | ICD-10-CM | POA: Diagnosis not present

## 2014-11-19 DIAGNOSIS — R1312 Dysphagia, oropharyngeal phase: Secondary | ICD-10-CM | POA: Diagnosis not present

## 2014-11-19 DIAGNOSIS — F339 Major depressive disorder, recurrent, unspecified: Secondary | ICD-10-CM | POA: Diagnosis not present

## 2014-11-19 DIAGNOSIS — M1990 Unspecified osteoarthritis, unspecified site: Secondary | ICD-10-CM | POA: Diagnosis not present

## 2014-11-19 DIAGNOSIS — N182 Chronic kidney disease, stage 2 (mild): Secondary | ICD-10-CM | POA: Diagnosis not present

## 2014-11-19 DIAGNOSIS — I129 Hypertensive chronic kidney disease with stage 1 through stage 4 chronic kidney disease, or unspecified chronic kidney disease: Secondary | ICD-10-CM | POA: Diagnosis not present

## 2014-11-20 DIAGNOSIS — Z431 Encounter for attention to gastrostomy: Secondary | ICD-10-CM | POA: Diagnosis not present

## 2014-11-20 DIAGNOSIS — I129 Hypertensive chronic kidney disease with stage 1 through stage 4 chronic kidney disease, or unspecified chronic kidney disease: Secondary | ICD-10-CM | POA: Diagnosis not present

## 2014-11-20 DIAGNOSIS — J449 Chronic obstructive pulmonary disease, unspecified: Secondary | ICD-10-CM | POA: Diagnosis not present

## 2014-11-20 DIAGNOSIS — N182 Chronic kidney disease, stage 2 (mild): Secondary | ICD-10-CM | POA: Diagnosis not present

## 2014-11-20 DIAGNOSIS — J45909 Unspecified asthma, uncomplicated: Secondary | ICD-10-CM | POA: Diagnosis not present

## 2014-11-20 DIAGNOSIS — R1312 Dysphagia, oropharyngeal phase: Secondary | ICD-10-CM | POA: Diagnosis not present

## 2014-11-20 DIAGNOSIS — F339 Major depressive disorder, recurrent, unspecified: Secondary | ICD-10-CM | POA: Diagnosis not present

## 2014-11-20 DIAGNOSIS — M1990 Unspecified osteoarthritis, unspecified site: Secondary | ICD-10-CM | POA: Diagnosis not present

## 2014-11-21 DIAGNOSIS — M1990 Unspecified osteoarthritis, unspecified site: Secondary | ICD-10-CM | POA: Diagnosis not present

## 2014-11-21 DIAGNOSIS — M25552 Pain in left hip: Secondary | ICD-10-CM | POA: Diagnosis not present

## 2014-11-21 DIAGNOSIS — M797 Fibromyalgia: Secondary | ICD-10-CM | POA: Diagnosis not present

## 2014-11-21 DIAGNOSIS — R6889 Other general symptoms and signs: Secondary | ICD-10-CM | POA: Diagnosis not present

## 2014-11-21 DIAGNOSIS — M545 Low back pain: Secondary | ICD-10-CM | POA: Diagnosis not present

## 2014-11-23 DIAGNOSIS — Z431 Encounter for attention to gastrostomy: Secondary | ICD-10-CM | POA: Diagnosis not present

## 2014-11-23 DIAGNOSIS — J45909 Unspecified asthma, uncomplicated: Secondary | ICD-10-CM | POA: Diagnosis not present

## 2014-11-23 DIAGNOSIS — F339 Major depressive disorder, recurrent, unspecified: Secondary | ICD-10-CM | POA: Diagnosis not present

## 2014-11-23 DIAGNOSIS — J449 Chronic obstructive pulmonary disease, unspecified: Secondary | ICD-10-CM | POA: Diagnosis not present

## 2014-11-23 DIAGNOSIS — R1312 Dysphagia, oropharyngeal phase: Secondary | ICD-10-CM | POA: Diagnosis not present

## 2014-11-23 DIAGNOSIS — N182 Chronic kidney disease, stage 2 (mild): Secondary | ICD-10-CM | POA: Diagnosis not present

## 2014-11-23 DIAGNOSIS — M1990 Unspecified osteoarthritis, unspecified site: Secondary | ICD-10-CM | POA: Diagnosis not present

## 2014-11-23 DIAGNOSIS — I129 Hypertensive chronic kidney disease with stage 1 through stage 4 chronic kidney disease, or unspecified chronic kidney disease: Secondary | ICD-10-CM | POA: Diagnosis not present

## 2014-11-26 ENCOUNTER — Ambulatory Visit: Payer: Commercial Managed Care - HMO

## 2014-11-26 DIAGNOSIS — R1312 Dysphagia, oropharyngeal phase: Secondary | ICD-10-CM | POA: Diagnosis not present

## 2014-11-26 DIAGNOSIS — N182 Chronic kidney disease, stage 2 (mild): Secondary | ICD-10-CM | POA: Diagnosis not present

## 2014-11-26 DIAGNOSIS — J449 Chronic obstructive pulmonary disease, unspecified: Secondary | ICD-10-CM | POA: Diagnosis not present

## 2014-11-26 DIAGNOSIS — Z431 Encounter for attention to gastrostomy: Secondary | ICD-10-CM | POA: Diagnosis not present

## 2014-11-26 DIAGNOSIS — J45909 Unspecified asthma, uncomplicated: Secondary | ICD-10-CM | POA: Diagnosis not present

## 2014-11-26 DIAGNOSIS — M1990 Unspecified osteoarthritis, unspecified site: Secondary | ICD-10-CM | POA: Diagnosis not present

## 2014-11-26 DIAGNOSIS — I129 Hypertensive chronic kidney disease with stage 1 through stage 4 chronic kidney disease, or unspecified chronic kidney disease: Secondary | ICD-10-CM | POA: Diagnosis not present

## 2014-11-26 DIAGNOSIS — F339 Major depressive disorder, recurrent, unspecified: Secondary | ICD-10-CM | POA: Diagnosis not present

## 2014-11-27 DIAGNOSIS — F339 Major depressive disorder, recurrent, unspecified: Secondary | ICD-10-CM | POA: Diagnosis not present

## 2014-11-27 DIAGNOSIS — R1312 Dysphagia, oropharyngeal phase: Secondary | ICD-10-CM | POA: Diagnosis not present

## 2014-11-27 DIAGNOSIS — M1990 Unspecified osteoarthritis, unspecified site: Secondary | ICD-10-CM | POA: Diagnosis not present

## 2014-11-27 DIAGNOSIS — J45909 Unspecified asthma, uncomplicated: Secondary | ICD-10-CM | POA: Diagnosis not present

## 2014-11-27 DIAGNOSIS — I129 Hypertensive chronic kidney disease with stage 1 through stage 4 chronic kidney disease, or unspecified chronic kidney disease: Secondary | ICD-10-CM | POA: Diagnosis not present

## 2014-11-27 DIAGNOSIS — Z431 Encounter for attention to gastrostomy: Secondary | ICD-10-CM | POA: Diagnosis not present

## 2014-11-27 DIAGNOSIS — J449 Chronic obstructive pulmonary disease, unspecified: Secondary | ICD-10-CM | POA: Diagnosis not present

## 2014-11-27 DIAGNOSIS — N182 Chronic kidney disease, stage 2 (mild): Secondary | ICD-10-CM | POA: Diagnosis not present

## 2014-11-28 DIAGNOSIS — F339 Major depressive disorder, recurrent, unspecified: Secondary | ICD-10-CM | POA: Diagnosis not present

## 2014-11-28 DIAGNOSIS — Z431 Encounter for attention to gastrostomy: Secondary | ICD-10-CM | POA: Diagnosis not present

## 2014-11-28 DIAGNOSIS — R1312 Dysphagia, oropharyngeal phase: Secondary | ICD-10-CM | POA: Diagnosis not present

## 2014-11-28 DIAGNOSIS — J45909 Unspecified asthma, uncomplicated: Secondary | ICD-10-CM | POA: Diagnosis not present

## 2014-11-28 DIAGNOSIS — I129 Hypertensive chronic kidney disease with stage 1 through stage 4 chronic kidney disease, or unspecified chronic kidney disease: Secondary | ICD-10-CM | POA: Diagnosis not present

## 2014-11-28 DIAGNOSIS — N182 Chronic kidney disease, stage 2 (mild): Secondary | ICD-10-CM | POA: Diagnosis not present

## 2014-11-28 DIAGNOSIS — J449 Chronic obstructive pulmonary disease, unspecified: Secondary | ICD-10-CM | POA: Diagnosis not present

## 2014-11-28 DIAGNOSIS — M1990 Unspecified osteoarthritis, unspecified site: Secondary | ICD-10-CM | POA: Diagnosis not present

## 2014-11-29 ENCOUNTER — Other Ambulatory Visit: Payer: Self-pay | Admitting: Licensed Clinical Social Worker

## 2014-11-29 DIAGNOSIS — J449 Chronic obstructive pulmonary disease, unspecified: Secondary | ICD-10-CM | POA: Diagnosis not present

## 2014-11-29 DIAGNOSIS — J45909 Unspecified asthma, uncomplicated: Secondary | ICD-10-CM | POA: Diagnosis not present

## 2014-11-29 DIAGNOSIS — I129 Hypertensive chronic kidney disease with stage 1 through stage 4 chronic kidney disease, or unspecified chronic kidney disease: Secondary | ICD-10-CM | POA: Diagnosis not present

## 2014-11-29 DIAGNOSIS — F339 Major depressive disorder, recurrent, unspecified: Secondary | ICD-10-CM | POA: Diagnosis not present

## 2014-11-29 DIAGNOSIS — R1312 Dysphagia, oropharyngeal phase: Secondary | ICD-10-CM | POA: Diagnosis not present

## 2014-11-29 DIAGNOSIS — N182 Chronic kidney disease, stage 2 (mild): Secondary | ICD-10-CM | POA: Diagnosis not present

## 2014-11-29 DIAGNOSIS — M1990 Unspecified osteoarthritis, unspecified site: Secondary | ICD-10-CM | POA: Diagnosis not present

## 2014-11-29 DIAGNOSIS — Z431 Encounter for attention to gastrostomy: Secondary | ICD-10-CM | POA: Diagnosis not present

## 2014-11-29 NOTE — Patient Outreach (Signed)
  Assessment: CSW Jamie Fields is continuing clinical social work care for client at present.  CSW called home number for client several times on 11/29/14.  Message stated that phone number of record was not a working telephone number at present.   CSW then called phone number of Jamie Fields, son of client, on 11/29/14. CSW was not able to speak via phone with Jamie Fields; however, CSW left phone message for Jamie Fields on 11/29/14  requesting that he please return call to [place] at [phone] to discuss current needs of client. CSW is awaiting return call from Jamie Fields, son of client.   Plan: Client to take medications as prescribed and to attend scheduled medical appointments. If Jamie Fields, son of client, does not return call to [place] in two business days, then CSW will again call Jamie Fields next week to speak with Jamie Fields about current needs of client.   Jamie Fields.Jamie Fields MSW, LCSW Licensed Clinical Social Worker [facility] Care Management [phone]

## 2014-12-03 DIAGNOSIS — R1312 Dysphagia, oropharyngeal phase: Secondary | ICD-10-CM | POA: Diagnosis not present

## 2014-12-03 DIAGNOSIS — M1990 Unspecified osteoarthritis, unspecified site: Secondary | ICD-10-CM | POA: Diagnosis not present

## 2014-12-03 DIAGNOSIS — Z431 Encounter for attention to gastrostomy: Secondary | ICD-10-CM | POA: Diagnosis not present

## 2014-12-03 DIAGNOSIS — I129 Hypertensive chronic kidney disease with stage 1 through stage 4 chronic kidney disease, or unspecified chronic kidney disease: Secondary | ICD-10-CM | POA: Diagnosis not present

## 2014-12-03 DIAGNOSIS — J449 Chronic obstructive pulmonary disease, unspecified: Secondary | ICD-10-CM | POA: Diagnosis not present

## 2014-12-03 DIAGNOSIS — N182 Chronic kidney disease, stage 2 (mild): Secondary | ICD-10-CM | POA: Diagnosis not present

## 2014-12-03 DIAGNOSIS — F339 Major depressive disorder, recurrent, unspecified: Secondary | ICD-10-CM | POA: Diagnosis not present

## 2014-12-03 DIAGNOSIS — J45909 Unspecified asthma, uncomplicated: Secondary | ICD-10-CM | POA: Diagnosis not present

## 2014-12-04 DIAGNOSIS — F339 Major depressive disorder, recurrent, unspecified: Secondary | ICD-10-CM | POA: Diagnosis not present

## 2014-12-04 DIAGNOSIS — J45909 Unspecified asthma, uncomplicated: Secondary | ICD-10-CM | POA: Diagnosis not present

## 2014-12-04 DIAGNOSIS — M1990 Unspecified osteoarthritis, unspecified site: Secondary | ICD-10-CM | POA: Diagnosis not present

## 2014-12-04 DIAGNOSIS — N182 Chronic kidney disease, stage 2 (mild): Secondary | ICD-10-CM | POA: Diagnosis not present

## 2014-12-04 DIAGNOSIS — R1312 Dysphagia, oropharyngeal phase: Secondary | ICD-10-CM | POA: Diagnosis not present

## 2014-12-04 DIAGNOSIS — I129 Hypertensive chronic kidney disease with stage 1 through stage 4 chronic kidney disease, or unspecified chronic kidney disease: Secondary | ICD-10-CM | POA: Diagnosis not present

## 2014-12-04 DIAGNOSIS — Z431 Encounter for attention to gastrostomy: Secondary | ICD-10-CM | POA: Diagnosis not present

## 2014-12-04 DIAGNOSIS — J449 Chronic obstructive pulmonary disease, unspecified: Secondary | ICD-10-CM | POA: Diagnosis not present

## 2014-12-06 DIAGNOSIS — J45909 Unspecified asthma, uncomplicated: Secondary | ICD-10-CM | POA: Diagnosis not present

## 2014-12-06 DIAGNOSIS — I129 Hypertensive chronic kidney disease with stage 1 through stage 4 chronic kidney disease, or unspecified chronic kidney disease: Secondary | ICD-10-CM | POA: Diagnosis not present

## 2014-12-06 DIAGNOSIS — J449 Chronic obstructive pulmonary disease, unspecified: Secondary | ICD-10-CM | POA: Diagnosis not present

## 2014-12-06 DIAGNOSIS — F339 Major depressive disorder, recurrent, unspecified: Secondary | ICD-10-CM | POA: Diagnosis not present

## 2014-12-06 DIAGNOSIS — N182 Chronic kidney disease, stage 2 (mild): Secondary | ICD-10-CM | POA: Diagnosis not present

## 2014-12-06 DIAGNOSIS — R1312 Dysphagia, oropharyngeal phase: Secondary | ICD-10-CM | POA: Diagnosis not present

## 2014-12-06 DIAGNOSIS — M1990 Unspecified osteoarthritis, unspecified site: Secondary | ICD-10-CM | POA: Diagnosis not present

## 2014-12-06 DIAGNOSIS — Z431 Encounter for attention to gastrostomy: Secondary | ICD-10-CM | POA: Diagnosis not present

## 2014-12-07 DIAGNOSIS — I129 Hypertensive chronic kidney disease with stage 1 through stage 4 chronic kidney disease, or unspecified chronic kidney disease: Secondary | ICD-10-CM | POA: Diagnosis not present

## 2014-12-07 DIAGNOSIS — R1312 Dysphagia, oropharyngeal phase: Secondary | ICD-10-CM | POA: Diagnosis not present

## 2014-12-07 DIAGNOSIS — J449 Chronic obstructive pulmonary disease, unspecified: Secondary | ICD-10-CM | POA: Diagnosis not present

## 2014-12-07 DIAGNOSIS — Z431 Encounter for attention to gastrostomy: Secondary | ICD-10-CM | POA: Diagnosis not present

## 2014-12-07 DIAGNOSIS — J45909 Unspecified asthma, uncomplicated: Secondary | ICD-10-CM | POA: Diagnosis not present

## 2014-12-07 DIAGNOSIS — N182 Chronic kidney disease, stage 2 (mild): Secondary | ICD-10-CM | POA: Diagnosis not present

## 2014-12-07 DIAGNOSIS — F339 Major depressive disorder, recurrent, unspecified: Secondary | ICD-10-CM | POA: Diagnosis not present

## 2014-12-07 DIAGNOSIS — M1990 Unspecified osteoarthritis, unspecified site: Secondary | ICD-10-CM | POA: Diagnosis not present

## 2014-12-10 DIAGNOSIS — F339 Major depressive disorder, recurrent, unspecified: Secondary | ICD-10-CM | POA: Diagnosis not present

## 2014-12-10 DIAGNOSIS — M1990 Unspecified osteoarthritis, unspecified site: Secondary | ICD-10-CM | POA: Diagnosis not present

## 2014-12-10 DIAGNOSIS — Z431 Encounter for attention to gastrostomy: Secondary | ICD-10-CM | POA: Diagnosis not present

## 2014-12-10 DIAGNOSIS — J45909 Unspecified asthma, uncomplicated: Secondary | ICD-10-CM | POA: Diagnosis not present

## 2014-12-10 DIAGNOSIS — J449 Chronic obstructive pulmonary disease, unspecified: Secondary | ICD-10-CM | POA: Diagnosis not present

## 2014-12-10 DIAGNOSIS — N182 Chronic kidney disease, stage 2 (mild): Secondary | ICD-10-CM | POA: Diagnosis not present

## 2014-12-10 DIAGNOSIS — I129 Hypertensive chronic kidney disease with stage 1 through stage 4 chronic kidney disease, or unspecified chronic kidney disease: Secondary | ICD-10-CM | POA: Diagnosis not present

## 2014-12-10 DIAGNOSIS — R1312 Dysphagia, oropharyngeal phase: Secondary | ICD-10-CM | POA: Diagnosis not present

## 2014-12-11 DIAGNOSIS — R1312 Dysphagia, oropharyngeal phase: Secondary | ICD-10-CM | POA: Diagnosis not present

## 2014-12-11 DIAGNOSIS — F339 Major depressive disorder, recurrent, unspecified: Secondary | ICD-10-CM | POA: Diagnosis not present

## 2014-12-11 DIAGNOSIS — M1990 Unspecified osteoarthritis, unspecified site: Secondary | ICD-10-CM | POA: Diagnosis not present

## 2014-12-11 DIAGNOSIS — J449 Chronic obstructive pulmonary disease, unspecified: Secondary | ICD-10-CM | POA: Diagnosis not present

## 2014-12-11 DIAGNOSIS — T182XXA Foreign body in stomach, initial encounter: Secondary | ICD-10-CM | POA: Diagnosis not present

## 2014-12-11 DIAGNOSIS — I129 Hypertensive chronic kidney disease with stage 1 through stage 4 chronic kidney disease, or unspecified chronic kidney disease: Secondary | ICD-10-CM | POA: Diagnosis not present

## 2014-12-11 DIAGNOSIS — K9423 Gastrostomy malfunction: Secondary | ICD-10-CM | POA: Diagnosis not present

## 2014-12-11 DIAGNOSIS — R6889 Other general symptoms and signs: Secondary | ICD-10-CM | POA: Diagnosis not present

## 2014-12-11 DIAGNOSIS — E669 Obesity, unspecified: Secondary | ICD-10-CM | POA: Diagnosis not present

## 2014-12-11 DIAGNOSIS — Z431 Encounter for attention to gastrostomy: Secondary | ICD-10-CM | POA: Diagnosis not present

## 2014-12-11 DIAGNOSIS — N182 Chronic kidney disease, stage 2 (mild): Secondary | ICD-10-CM | POA: Diagnosis not present

## 2014-12-11 DIAGNOSIS — F419 Anxiety disorder, unspecified: Secondary | ICD-10-CM | POA: Diagnosis not present

## 2014-12-11 DIAGNOSIS — J45909 Unspecified asthma, uncomplicated: Secondary | ICD-10-CM | POA: Diagnosis not present

## 2014-12-11 DIAGNOSIS — I10 Essential (primary) hypertension: Secondary | ICD-10-CM | POA: Diagnosis not present

## 2014-12-12 DIAGNOSIS — M1990 Unspecified osteoarthritis, unspecified site: Secondary | ICD-10-CM | POA: Diagnosis not present

## 2014-12-12 DIAGNOSIS — F339 Major depressive disorder, recurrent, unspecified: Secondary | ICD-10-CM | POA: Diagnosis not present

## 2014-12-12 DIAGNOSIS — J449 Chronic obstructive pulmonary disease, unspecified: Secondary | ICD-10-CM | POA: Diagnosis not present

## 2014-12-12 DIAGNOSIS — R1312 Dysphagia, oropharyngeal phase: Secondary | ICD-10-CM | POA: Diagnosis not present

## 2014-12-12 DIAGNOSIS — I129 Hypertensive chronic kidney disease with stage 1 through stage 4 chronic kidney disease, or unspecified chronic kidney disease: Secondary | ICD-10-CM | POA: Diagnosis not present

## 2014-12-12 DIAGNOSIS — Z431 Encounter for attention to gastrostomy: Secondary | ICD-10-CM | POA: Diagnosis not present

## 2014-12-12 DIAGNOSIS — N182 Chronic kidney disease, stage 2 (mild): Secondary | ICD-10-CM | POA: Diagnosis not present

## 2014-12-12 DIAGNOSIS — J45909 Unspecified asthma, uncomplicated: Secondary | ICD-10-CM | POA: Diagnosis not present

## 2014-12-13 DIAGNOSIS — M1990 Unspecified osteoarthritis, unspecified site: Secondary | ICD-10-CM | POA: Diagnosis not present

## 2014-12-13 DIAGNOSIS — R1312 Dysphagia, oropharyngeal phase: Secondary | ICD-10-CM | POA: Diagnosis not present

## 2014-12-13 DIAGNOSIS — F339 Major depressive disorder, recurrent, unspecified: Secondary | ICD-10-CM | POA: Diagnosis not present

## 2014-12-13 DIAGNOSIS — J449 Chronic obstructive pulmonary disease, unspecified: Secondary | ICD-10-CM | POA: Diagnosis not present

## 2014-12-13 DIAGNOSIS — J45909 Unspecified asthma, uncomplicated: Secondary | ICD-10-CM | POA: Diagnosis not present

## 2014-12-13 DIAGNOSIS — I129 Hypertensive chronic kidney disease with stage 1 through stage 4 chronic kidney disease, or unspecified chronic kidney disease: Secondary | ICD-10-CM | POA: Diagnosis not present

## 2014-12-13 DIAGNOSIS — Z431 Encounter for attention to gastrostomy: Secondary | ICD-10-CM | POA: Diagnosis not present

## 2014-12-13 DIAGNOSIS — N182 Chronic kidney disease, stage 2 (mild): Secondary | ICD-10-CM | POA: Diagnosis not present

## 2014-12-14 DIAGNOSIS — R1312 Dysphagia, oropharyngeal phase: Secondary | ICD-10-CM | POA: Diagnosis not present

## 2014-12-14 DIAGNOSIS — Z431 Encounter for attention to gastrostomy: Secondary | ICD-10-CM | POA: Diagnosis not present

## 2014-12-14 DIAGNOSIS — I129 Hypertensive chronic kidney disease with stage 1 through stage 4 chronic kidney disease, or unspecified chronic kidney disease: Secondary | ICD-10-CM | POA: Diagnosis not present

## 2014-12-14 DIAGNOSIS — J449 Chronic obstructive pulmonary disease, unspecified: Secondary | ICD-10-CM | POA: Diagnosis not present

## 2014-12-14 DIAGNOSIS — N182 Chronic kidney disease, stage 2 (mild): Secondary | ICD-10-CM | POA: Diagnosis not present

## 2014-12-14 DIAGNOSIS — J45909 Unspecified asthma, uncomplicated: Secondary | ICD-10-CM | POA: Diagnosis not present

## 2014-12-14 DIAGNOSIS — M1990 Unspecified osteoarthritis, unspecified site: Secondary | ICD-10-CM | POA: Diagnosis not present

## 2014-12-14 DIAGNOSIS — F339 Major depressive disorder, recurrent, unspecified: Secondary | ICD-10-CM | POA: Diagnosis not present

## 2014-12-18 DIAGNOSIS — F339 Major depressive disorder, recurrent, unspecified: Secondary | ICD-10-CM | POA: Diagnosis not present

## 2014-12-18 DIAGNOSIS — R1312 Dysphagia, oropharyngeal phase: Secondary | ICD-10-CM | POA: Diagnosis not present

## 2014-12-18 DIAGNOSIS — Z431 Encounter for attention to gastrostomy: Secondary | ICD-10-CM | POA: Diagnosis not present

## 2014-12-18 DIAGNOSIS — J449 Chronic obstructive pulmonary disease, unspecified: Secondary | ICD-10-CM | POA: Diagnosis not present

## 2014-12-18 DIAGNOSIS — M1990 Unspecified osteoarthritis, unspecified site: Secondary | ICD-10-CM | POA: Diagnosis not present

## 2014-12-18 DIAGNOSIS — J45909 Unspecified asthma, uncomplicated: Secondary | ICD-10-CM | POA: Diagnosis not present

## 2014-12-18 DIAGNOSIS — N182 Chronic kidney disease, stage 2 (mild): Secondary | ICD-10-CM | POA: Diagnosis not present

## 2014-12-18 DIAGNOSIS — I129 Hypertensive chronic kidney disease with stage 1 through stage 4 chronic kidney disease, or unspecified chronic kidney disease: Secondary | ICD-10-CM | POA: Diagnosis not present

## 2014-12-18 DIAGNOSIS — R6889 Other general symptoms and signs: Secondary | ICD-10-CM | POA: Diagnosis not present

## 2014-12-19 DIAGNOSIS — J45909 Unspecified asthma, uncomplicated: Secondary | ICD-10-CM | POA: Diagnosis not present

## 2014-12-19 DIAGNOSIS — Z431 Encounter for attention to gastrostomy: Secondary | ICD-10-CM | POA: Diagnosis not present

## 2014-12-19 DIAGNOSIS — N182 Chronic kidney disease, stage 2 (mild): Secondary | ICD-10-CM | POA: Diagnosis not present

## 2014-12-19 DIAGNOSIS — R1312 Dysphagia, oropharyngeal phase: Secondary | ICD-10-CM | POA: Diagnosis not present

## 2014-12-19 DIAGNOSIS — J449 Chronic obstructive pulmonary disease, unspecified: Secondary | ICD-10-CM | POA: Diagnosis not present

## 2014-12-19 DIAGNOSIS — M1990 Unspecified osteoarthritis, unspecified site: Secondary | ICD-10-CM | POA: Diagnosis not present

## 2014-12-19 DIAGNOSIS — F339 Major depressive disorder, recurrent, unspecified: Secondary | ICD-10-CM | POA: Diagnosis not present

## 2014-12-19 DIAGNOSIS — I129 Hypertensive chronic kidney disease with stage 1 through stage 4 chronic kidney disease, or unspecified chronic kidney disease: Secondary | ICD-10-CM | POA: Diagnosis not present

## 2014-12-21 DIAGNOSIS — M1990 Unspecified osteoarthritis, unspecified site: Secondary | ICD-10-CM | POA: Diagnosis not present

## 2014-12-21 DIAGNOSIS — R6889 Other general symptoms and signs: Secondary | ICD-10-CM | POA: Diagnosis not present

## 2014-12-21 DIAGNOSIS — F419 Anxiety disorder, unspecified: Secondary | ICD-10-CM | POA: Diagnosis not present

## 2014-12-21 DIAGNOSIS — F339 Major depressive disorder, recurrent, unspecified: Secondary | ICD-10-CM | POA: Diagnosis not present

## 2014-12-21 DIAGNOSIS — I129 Hypertensive chronic kidney disease with stage 1 through stage 4 chronic kidney disease, or unspecified chronic kidney disease: Secondary | ICD-10-CM | POA: Diagnosis not present

## 2014-12-21 DIAGNOSIS — M797 Fibromyalgia: Secondary | ICD-10-CM | POA: Diagnosis not present

## 2014-12-21 DIAGNOSIS — J449 Chronic obstructive pulmonary disease, unspecified: Secondary | ICD-10-CM | POA: Diagnosis not present

## 2014-12-21 DIAGNOSIS — Z431 Encounter for attention to gastrostomy: Secondary | ICD-10-CM | POA: Diagnosis not present

## 2014-12-21 DIAGNOSIS — N182 Chronic kidney disease, stage 2 (mild): Secondary | ICD-10-CM | POA: Diagnosis not present

## 2014-12-21 DIAGNOSIS — J45909 Unspecified asthma, uncomplicated: Secondary | ICD-10-CM | POA: Diagnosis not present

## 2014-12-21 DIAGNOSIS — R1312 Dysphagia, oropharyngeal phase: Secondary | ICD-10-CM | POA: Diagnosis not present

## 2014-12-26 DIAGNOSIS — N182 Chronic kidney disease, stage 2 (mild): Secondary | ICD-10-CM | POA: Diagnosis not present

## 2014-12-26 DIAGNOSIS — M1990 Unspecified osteoarthritis, unspecified site: Secondary | ICD-10-CM | POA: Diagnosis not present

## 2014-12-26 DIAGNOSIS — J45909 Unspecified asthma, uncomplicated: Secondary | ICD-10-CM | POA: Diagnosis not present

## 2014-12-26 DIAGNOSIS — Z431 Encounter for attention to gastrostomy: Secondary | ICD-10-CM | POA: Diagnosis not present

## 2014-12-26 DIAGNOSIS — F339 Major depressive disorder, recurrent, unspecified: Secondary | ICD-10-CM | POA: Diagnosis not present

## 2014-12-26 DIAGNOSIS — J449 Chronic obstructive pulmonary disease, unspecified: Secondary | ICD-10-CM | POA: Diagnosis not present

## 2014-12-26 DIAGNOSIS — R1312 Dysphagia, oropharyngeal phase: Secondary | ICD-10-CM | POA: Diagnosis not present

## 2014-12-26 DIAGNOSIS — I129 Hypertensive chronic kidney disease with stage 1 through stage 4 chronic kidney disease, or unspecified chronic kidney disease: Secondary | ICD-10-CM | POA: Diagnosis not present

## 2014-12-28 DIAGNOSIS — R1312 Dysphagia, oropharyngeal phase: Secondary | ICD-10-CM | POA: Diagnosis not present

## 2014-12-28 DIAGNOSIS — F339 Major depressive disorder, recurrent, unspecified: Secondary | ICD-10-CM | POA: Diagnosis not present

## 2014-12-28 DIAGNOSIS — J45909 Unspecified asthma, uncomplicated: Secondary | ICD-10-CM | POA: Diagnosis not present

## 2014-12-28 DIAGNOSIS — I129 Hypertensive chronic kidney disease with stage 1 through stage 4 chronic kidney disease, or unspecified chronic kidney disease: Secondary | ICD-10-CM | POA: Diagnosis not present

## 2014-12-28 DIAGNOSIS — N182 Chronic kidney disease, stage 2 (mild): Secondary | ICD-10-CM | POA: Diagnosis not present

## 2014-12-28 DIAGNOSIS — M1990 Unspecified osteoarthritis, unspecified site: Secondary | ICD-10-CM | POA: Diagnosis not present

## 2014-12-28 DIAGNOSIS — Z431 Encounter for attention to gastrostomy: Secondary | ICD-10-CM | POA: Diagnosis not present

## 2014-12-28 DIAGNOSIS — J449 Chronic obstructive pulmonary disease, unspecified: Secondary | ICD-10-CM | POA: Diagnosis not present

## 2015-01-01 DIAGNOSIS — I129 Hypertensive chronic kidney disease with stage 1 through stage 4 chronic kidney disease, or unspecified chronic kidney disease: Secondary | ICD-10-CM | POA: Diagnosis not present

## 2015-01-01 DIAGNOSIS — R1312 Dysphagia, oropharyngeal phase: Secondary | ICD-10-CM | POA: Diagnosis not present

## 2015-01-01 DIAGNOSIS — N182 Chronic kidney disease, stage 2 (mild): Secondary | ICD-10-CM | POA: Diagnosis not present

## 2015-01-01 DIAGNOSIS — M1990 Unspecified osteoarthritis, unspecified site: Secondary | ICD-10-CM | POA: Diagnosis not present

## 2015-01-01 DIAGNOSIS — Z431 Encounter for attention to gastrostomy: Secondary | ICD-10-CM | POA: Diagnosis not present

## 2015-01-01 DIAGNOSIS — J449 Chronic obstructive pulmonary disease, unspecified: Secondary | ICD-10-CM | POA: Diagnosis not present

## 2015-01-01 DIAGNOSIS — F339 Major depressive disorder, recurrent, unspecified: Secondary | ICD-10-CM | POA: Diagnosis not present

## 2015-01-01 DIAGNOSIS — J45909 Unspecified asthma, uncomplicated: Secondary | ICD-10-CM | POA: Diagnosis not present

## 2015-01-04 DIAGNOSIS — Z431 Encounter for attention to gastrostomy: Secondary | ICD-10-CM | POA: Diagnosis not present

## 2015-01-04 DIAGNOSIS — J449 Chronic obstructive pulmonary disease, unspecified: Secondary | ICD-10-CM | POA: Diagnosis not present

## 2015-01-04 DIAGNOSIS — N182 Chronic kidney disease, stage 2 (mild): Secondary | ICD-10-CM | POA: Diagnosis not present

## 2015-01-04 DIAGNOSIS — I129 Hypertensive chronic kidney disease with stage 1 through stage 4 chronic kidney disease, or unspecified chronic kidney disease: Secondary | ICD-10-CM | POA: Diagnosis not present

## 2015-01-04 DIAGNOSIS — F339 Major depressive disorder, recurrent, unspecified: Secondary | ICD-10-CM | POA: Diagnosis not present

## 2015-01-04 DIAGNOSIS — M1990 Unspecified osteoarthritis, unspecified site: Secondary | ICD-10-CM | POA: Diagnosis not present

## 2015-01-04 DIAGNOSIS — R1312 Dysphagia, oropharyngeal phase: Secondary | ICD-10-CM | POA: Diagnosis not present

## 2015-01-04 DIAGNOSIS — J45909 Unspecified asthma, uncomplicated: Secondary | ICD-10-CM | POA: Diagnosis not present

## 2015-01-09 DIAGNOSIS — F339 Major depressive disorder, recurrent, unspecified: Secondary | ICD-10-CM | POA: Diagnosis not present

## 2015-01-09 DIAGNOSIS — Z431 Encounter for attention to gastrostomy: Secondary | ICD-10-CM | POA: Diagnosis not present

## 2015-01-09 DIAGNOSIS — J45909 Unspecified asthma, uncomplicated: Secondary | ICD-10-CM | POA: Diagnosis not present

## 2015-01-09 DIAGNOSIS — R1312 Dysphagia, oropharyngeal phase: Secondary | ICD-10-CM | POA: Diagnosis not present

## 2015-01-09 DIAGNOSIS — J449 Chronic obstructive pulmonary disease, unspecified: Secondary | ICD-10-CM | POA: Diagnosis not present

## 2015-01-09 DIAGNOSIS — N182 Chronic kidney disease, stage 2 (mild): Secondary | ICD-10-CM | POA: Diagnosis not present

## 2015-01-09 DIAGNOSIS — I129 Hypertensive chronic kidney disease with stage 1 through stage 4 chronic kidney disease, or unspecified chronic kidney disease: Secondary | ICD-10-CM | POA: Diagnosis not present

## 2015-01-09 DIAGNOSIS — M1990 Unspecified osteoarthritis, unspecified site: Secondary | ICD-10-CM | POA: Diagnosis not present

## 2015-01-10 DIAGNOSIS — J45909 Unspecified asthma, uncomplicated: Secondary | ICD-10-CM | POA: Diagnosis not present

## 2015-01-10 DIAGNOSIS — M1990 Unspecified osteoarthritis, unspecified site: Secondary | ICD-10-CM | POA: Diagnosis not present

## 2015-01-10 DIAGNOSIS — R1312 Dysphagia, oropharyngeal phase: Secondary | ICD-10-CM | POA: Diagnosis not present

## 2015-01-10 DIAGNOSIS — Z431 Encounter for attention to gastrostomy: Secondary | ICD-10-CM | POA: Diagnosis not present

## 2015-01-10 DIAGNOSIS — F339 Major depressive disorder, recurrent, unspecified: Secondary | ICD-10-CM | POA: Diagnosis not present

## 2015-01-10 DIAGNOSIS — I129 Hypertensive chronic kidney disease with stage 1 through stage 4 chronic kidney disease, or unspecified chronic kidney disease: Secondary | ICD-10-CM | POA: Diagnosis not present

## 2015-01-10 DIAGNOSIS — J449 Chronic obstructive pulmonary disease, unspecified: Secondary | ICD-10-CM | POA: Diagnosis not present

## 2015-01-10 DIAGNOSIS — N182 Chronic kidney disease, stage 2 (mild): Secondary | ICD-10-CM | POA: Diagnosis not present

## 2015-02-06 ENCOUNTER — Other Ambulatory Visit: Payer: Self-pay | Admitting: Licensed Clinical Social Worker

## 2015-02-06 NOTE — Patient Outreach (Signed)
Assessment:  CSW completed chart review on client on 02/06/15.  CSW called home phone number of client ([phone]) two times  on 02/06/15 and left phone message both times requesting client to please return call to CSW at [phone] to discuss current needs of client.  CSW is awaiting return call from client to discuss current needs of client.   Plan: Client to take medications as prescribed and to attend scheduled medical appointments. Client to return call to CSW at [phone]. If client does not return call to [place] in 48 hours, then CSW will again try to contact the client via phone next week to discuss needs of client.  Norva Riffle.Synai Prettyman MSW, LCSW Licensed Clinical Social Worker [facility] Care Management [phone]

## 2015-02-20 DIAGNOSIS — M797 Fibromyalgia: Secondary | ICD-10-CM | POA: Diagnosis not present

## 2015-02-20 DIAGNOSIS — J209 Acute bronchitis, unspecified: Secondary | ICD-10-CM | POA: Diagnosis not present

## 2015-02-20 DIAGNOSIS — M1990 Unspecified osteoarthritis, unspecified site: Secondary | ICD-10-CM | POA: Diagnosis not present

## 2015-03-15 ENCOUNTER — Other Ambulatory Visit: Payer: Self-pay | Admitting: Licensed Clinical Social Worker

## 2015-03-15 NOTE — Patient Outreach (Signed)
Assessment:  CSW called home number of client on 03/15/15 but was not able to speak with client. CSW left phone message for client on 03/15/15 requesting that she return call to CSW at [phone] to discuss needs of client. CSW then called Ames Dura, son of client, on 03/15/15. Ollen Gross is emergency contact for client.  CSW verified identity of Ollen Gross and introduced self to [place].  CSW and Ollen Gross spoke of current needs of client. Ollen Gross said that client had not completed any advanced directives even though this had been discussed with her previously by a CSW Delmar Landau, [place]) at [facility] program. Son of client reported that client saw her primary doctor a few weeks ago and that client continued to use walker to help her ambulate. Son of client reported that client has financial struggles each month and sometimes does not have enough money to buy some of the needed food items she may need. CSW to talk further with client/son of client about food pantry resources in the area.  Client does attend church regularly.  Client usually drives herself to needed medical appointments. Client has prescribed medications needed per information from Ames Dura.  CSW and Ollen Gross spoke of nutrition needs of client, quality of life issues for client and depression indicators for client.  CSW and Ollen Gross completed depression scale (PHQ2 and PHQ 9 )for client, completed nutritional assessment for client and completed Quality of Life assessment for client.   CSW provided [place] with name and phone number of CSW.  CSW said that he would contact client/[place] in next few weeks to further discuss needs of client.  Ollen Gross was appreciative of phone call from [place] on 03/15/15.   Plan: CSW to call client/son of client in two weeks to assess needs of client. Client to take medications as prescribed and attend scheduled medical appointments. Client to review materials provided previously on advanced directives document preparation.  Norva Riffle.Jaecob Lowden MSW, LCSW Licensed Clinical Social Worker [facility] Care Management [phone]

## 2015-04-24 DIAGNOSIS — Z23 Encounter for immunization: Secondary | ICD-10-CM | POA: Diagnosis not present

## 2015-04-24 DIAGNOSIS — Z6837 Body mass index (BMI) 37.0-37.9, adult: Secondary | ICD-10-CM | POA: Diagnosis not present

## 2015-04-24 DIAGNOSIS — M1990 Unspecified osteoarthritis, unspecified site: Secondary | ICD-10-CM | POA: Diagnosis not present

## 2015-04-24 DIAGNOSIS — M797 Fibromyalgia: Secondary | ICD-10-CM | POA: Diagnosis not present

## 2015-04-24 DIAGNOSIS — E785 Hyperlipidemia, unspecified: Secondary | ICD-10-CM | POA: Diagnosis not present

## 2015-04-24 DIAGNOSIS — I10 Essential (primary) hypertension: Secondary | ICD-10-CM | POA: Diagnosis not present

## 2015-04-24 DIAGNOSIS — R6889 Other general symptoms and signs: Secondary | ICD-10-CM | POA: Diagnosis not present

## 2015-05-23 ENCOUNTER — Encounter: Payer: Self-pay | Admitting: Licensed Clinical Social Worker

## 2015-05-23 ENCOUNTER — Other Ambulatory Visit: Payer: Self-pay | Admitting: Licensed Clinical Social Worker

## 2015-05-23 NOTE — Patient Outreach (Signed)
Assessment: CSW called home phone number of client on 05/23/15. CSW spoke via phone with client on 05/23/15. CSW verified identity of client. CSW and client spoke of client needs. Client said that her brother, Francee Piccolo, assists client at home.  Francee Piccolo helps client with home cooking, shopping and home maintenance.  Client can bath self, dress self, and uses walker to ambulate.  She said she can walk a short distance. She said she gets out of breath with walking long distance.  She said she has nebulizer she uses as prescribed.  She said she has appointment with her primary care doctor  on 07/23/15.  She said she has her prescribed medications.  She said she is taking medications as prescribed.  Client said that her right leg is swollen and her feet hurt. She said that her right foot tingles and hurts often. She said she had talked with her doctor about these symptoms previously.  Client was encouraged by her doctor to elevate her feet upon sitting.  She said she takes pain medications as prescribed.  She said she could call her doctor as needed if swelling in right leg worsens or if pain in right foot worsens. She said she drives her self to her medical appointments.  She said she had adequate food supply and said she is sleeping well. CSW and client spoke of advanced directives for client. Client had previously received advanced directives booklet to review. Client said she still had not completed advanced directives document for client. CSW encouraged client to read again the advanced directives booklet and CSW can call client in two weeks to further discuss advanced directives preparation for client.  CSW also spoke via phone with client about [place] document and about Living Will document. Client said she had advanced directives booklet and would read this booklet. Client said she has support also from her son, Ollen Gross.  She spoke with CSW about depression and anxiety issues. She said she is  taking medications as prescribed to help her manage depression and anxiety. She said she thought these medications were effective and were helping her manage depression and anxiety symptoms. CSW encouraged client to call CSW at [phone] as needed to discuss social work needs of client. CSW thanked client for phone call with CSW on 05/23/15.  Plan: Client to read/review booklet on advanced directives previously given to client. CSW to call client in two weeks to further discuss advanced directives preparation with client. Client to communicate with her primary care doctor, as needed, to discuss medical issues of client.  Norva Riffle.Dalasia Predmore MSW, LCSW Licensed Clinical Social Worker [facility] Care Management [phone]

## 2015-06-06 ENCOUNTER — Encounter: Payer: Self-pay | Admitting: Licensed Clinical Social Worker

## 2015-06-06 ENCOUNTER — Other Ambulatory Visit: Payer: Self-pay | Admitting: Licensed Clinical Social Worker

## 2015-06-06 NOTE — Patient Outreach (Signed)
[facility]) Care Management  06/06/2015  Jamie Fields 08-Feb-1956 [id number]   Notification from Theadore Nan, LCSW to close case due to goals met with [place] Management.  Thanks, Ronnell Freshwater. [place], [place] Assistant Phone: [phone] Fax: [phone]

## 2015-06-06 NOTE — Patient Outreach (Signed)
Assessment:  CSW called home phone number of client on 06/06/15 and spoke via phone with client. CSW verified identity of client. CSW and client spoke of current client needs. CSW and client spoke of advanced directives for client. Client has received advanced directives booklet and reviewed this advanced directives booklet. She has talked with family member (brother) about advanced directives.  CSW has reviewed [place] document and Living Will document preparation with client in the past.  On 06/06/15, CSW again reviewed with client [place] of Attorney document and the Living Will document.  Client said she understood information shared about advanced directives documents. CSW has completed with client PHQ2 and PHQ9 forms for client.  Client is taking medications as prescribed. CSW talked with client about anxiety/depression symptoms. Client said she thinks that her current medications prescribed are helping her manage anxiety/depression symptoms. Client was not interested in receiving counseling support from [place] informed client that [place] would discharge client from [facility] CSW services on 06/06/15. Client agreed to this plan.  CSW thanked client for working with [facility] program staff in recent months. Client was appreciative of [facility] program support.  Plan: CSW is discharging client from [place] on 06/06/15 since client has no further social work needs. CSW to inform [place] Del on 06/06/15 that [place] discharged client on 06/06/15. CSW to send physician case closure letter to Dr. Reesa Chew on 06/06/15 informing Dr. Reesa Chew of [place] closure of client case.  Norva Riffle.Elliyah Liszewski MSW, LCSW Licensed Clinical Social Worker [facility] Care Management [phone]

## 2015-06-18 DIAGNOSIS — J0110 Acute frontal sinusitis, unspecified: Secondary | ICD-10-CM | POA: Diagnosis not present

## 2015-07-16 DIAGNOSIS — I10 Essential (primary) hypertension: Secondary | ICD-10-CM | POA: Diagnosis not present

## 2015-07-16 DIAGNOSIS — M797 Fibromyalgia: Secondary | ICD-10-CM | POA: Diagnosis not present

## 2015-07-16 DIAGNOSIS — R6889 Other general symptoms and signs: Secondary | ICD-10-CM | POA: Diagnosis not present

## 2015-07-16 DIAGNOSIS — M1990 Unspecified osteoarthritis, unspecified site: Secondary | ICD-10-CM | POA: Diagnosis not present

## 2015-07-16 DIAGNOSIS — E785 Hyperlipidemia, unspecified: Secondary | ICD-10-CM | POA: Diagnosis not present

## 2015-07-16 DIAGNOSIS — G44229 Chronic tension-type headache, not intractable: Secondary | ICD-10-CM | POA: Diagnosis not present

## 2015-07-16 DIAGNOSIS — E7800 Pure hypercholesterolemia, unspecified: Secondary | ICD-10-CM | POA: Diagnosis not present

## 2015-10-23 DIAGNOSIS — M25561 Pain in right knee: Secondary | ICD-10-CM | POA: Diagnosis not present

## 2015-10-23 DIAGNOSIS — G8929 Other chronic pain: Secondary | ICD-10-CM | POA: Diagnosis not present

## 2015-10-23 DIAGNOSIS — F419 Anxiety disorder, unspecified: Secondary | ICD-10-CM | POA: Diagnosis not present

## 2015-10-23 DIAGNOSIS — E785 Hyperlipidemia, unspecified: Secondary | ICD-10-CM | POA: Diagnosis not present

## 2015-10-23 DIAGNOSIS — M1990 Unspecified osteoarthritis, unspecified site: Secondary | ICD-10-CM | POA: Diagnosis not present

## 2015-10-23 DIAGNOSIS — M7050 Other bursitis of knee, unspecified knee: Secondary | ICD-10-CM | POA: Diagnosis not present

## 2015-10-23 DIAGNOSIS — I10 Essential (primary) hypertension: Secondary | ICD-10-CM | POA: Diagnosis not present

## 2015-10-23 DIAGNOSIS — R5383 Other fatigue: Secondary | ICD-10-CM | POA: Diagnosis not present

## 2015-11-13 DIAGNOSIS — G8929 Other chronic pain: Secondary | ICD-10-CM | POA: Diagnosis not present

## 2015-11-13 DIAGNOSIS — I10 Essential (primary) hypertension: Secondary | ICD-10-CM | POA: Diagnosis not present

## 2015-11-13 DIAGNOSIS — F419 Anxiety disorder, unspecified: Secondary | ICD-10-CM | POA: Diagnosis not present

## 2015-11-13 DIAGNOSIS — R5383 Other fatigue: Secondary | ICD-10-CM | POA: Diagnosis not present

## 2015-11-13 DIAGNOSIS — M25561 Pain in right knee: Secondary | ICD-10-CM | POA: Diagnosis not present

## 2015-11-13 DIAGNOSIS — M1990 Unspecified osteoarthritis, unspecified site: Secondary | ICD-10-CM | POA: Diagnosis not present

## 2015-12-16 DIAGNOSIS — N390 Urinary tract infection, site not specified: Secondary | ICD-10-CM | POA: Diagnosis not present

## 2015-12-16 DIAGNOSIS — I10 Essential (primary) hypertension: Secondary | ICD-10-CM | POA: Diagnosis not present

## 2016-01-01 DIAGNOSIS — G4733 Obstructive sleep apnea (adult) (pediatric): Secondary | ICD-10-CM | POA: Diagnosis not present

## 2016-01-13 DIAGNOSIS — M25561 Pain in right knee: Secondary | ICD-10-CM | POA: Diagnosis not present

## 2016-01-13 DIAGNOSIS — M1990 Unspecified osteoarthritis, unspecified site: Secondary | ICD-10-CM | POA: Diagnosis not present

## 2016-01-13 DIAGNOSIS — I10 Essential (primary) hypertension: Secondary | ICD-10-CM | POA: Diagnosis not present

## 2016-01-13 DIAGNOSIS — E785 Hyperlipidemia, unspecified: Secondary | ICD-10-CM | POA: Diagnosis not present

## 2016-01-13 DIAGNOSIS — F419 Anxiety disorder, unspecified: Secondary | ICD-10-CM | POA: Diagnosis not present

## 2016-01-13 DIAGNOSIS — Z6841 Body Mass Index (BMI) 40.0 and over, adult: Secondary | ICD-10-CM | POA: Diagnosis not present

## 2016-02-03 DIAGNOSIS — J069 Acute upper respiratory infection, unspecified: Secondary | ICD-10-CM | POA: Diagnosis not present

## 2016-02-03 DIAGNOSIS — J4531 Mild persistent asthma with (acute) exacerbation: Secondary | ICD-10-CM | POA: Diagnosis not present

## 2016-03-16 DIAGNOSIS — E785 Hyperlipidemia, unspecified: Secondary | ICD-10-CM | POA: Diagnosis not present

## 2016-03-16 DIAGNOSIS — I10 Essential (primary) hypertension: Secondary | ICD-10-CM | POA: Diagnosis not present

## 2016-03-16 DIAGNOSIS — Z23 Encounter for immunization: Secondary | ICD-10-CM | POA: Diagnosis not present

## 2016-03-16 DIAGNOSIS — J3089 Other allergic rhinitis: Secondary | ICD-10-CM | POA: Diagnosis not present

## 2016-03-16 DIAGNOSIS — Z6838 Body mass index (BMI) 38.0-38.9, adult: Secondary | ICD-10-CM | POA: Diagnosis not present

## 2016-03-16 DIAGNOSIS — J45909 Unspecified asthma, uncomplicated: Secondary | ICD-10-CM | POA: Diagnosis not present

## 2016-04-06 DIAGNOSIS — N390 Urinary tract infection, site not specified: Secondary | ICD-10-CM | POA: Diagnosis not present

## 2016-04-06 DIAGNOSIS — J029 Acute pharyngitis, unspecified: Secondary | ICD-10-CM | POA: Diagnosis not present

## 2016-04-16 DIAGNOSIS — I10 Essential (primary) hypertension: Secondary | ICD-10-CM | POA: Diagnosis not present

## 2016-04-16 DIAGNOSIS — M797 Fibromyalgia: Secondary | ICD-10-CM | POA: Diagnosis not present

## 2016-04-16 DIAGNOSIS — Z6838 Body mass index (BMI) 38.0-38.9, adult: Secondary | ICD-10-CM | POA: Diagnosis not present

## 2016-04-16 DIAGNOSIS — Z1389 Encounter for screening for other disorder: Secondary | ICD-10-CM | POA: Diagnosis not present

## 2016-04-16 DIAGNOSIS — G629 Polyneuropathy, unspecified: Secondary | ICD-10-CM | POA: Diagnosis not present

## 2016-04-16 DIAGNOSIS — R42 Dizziness and giddiness: Secondary | ICD-10-CM | POA: Diagnosis not present

## 2016-04-16 DIAGNOSIS — F329 Major depressive disorder, single episode, unspecified: Secondary | ICD-10-CM | POA: Diagnosis not present

## 2016-04-16 DIAGNOSIS — M1990 Unspecified osteoarthritis, unspecified site: Secondary | ICD-10-CM | POA: Diagnosis not present

## 2016-04-16 DIAGNOSIS — J45909 Unspecified asthma, uncomplicated: Secondary | ICD-10-CM | POA: Diagnosis not present

## 2016-04-16 DIAGNOSIS — K219 Gastro-esophageal reflux disease without esophagitis: Secondary | ICD-10-CM | POA: Diagnosis not present

## 2016-04-16 DIAGNOSIS — E785 Hyperlipidemia, unspecified: Secondary | ICD-10-CM | POA: Diagnosis not present

## 2016-04-16 DIAGNOSIS — F419 Anxiety disorder, unspecified: Secondary | ICD-10-CM | POA: Diagnosis not present

## 2016-04-16 DIAGNOSIS — Z0000 Encounter for general adult medical examination without abnormal findings: Secondary | ICD-10-CM | POA: Diagnosis not present

## 2016-04-16 DIAGNOSIS — G43909 Migraine, unspecified, not intractable, without status migrainosus: Secondary | ICD-10-CM | POA: Diagnosis not present

## 2016-04-21 DIAGNOSIS — R799 Abnormal finding of blood chemistry, unspecified: Secondary | ICD-10-CM | POA: Diagnosis not present

## 2016-05-01 DIAGNOSIS — Z1231 Encounter for screening mammogram for malignant neoplasm of breast: Secondary | ICD-10-CM | POA: Diagnosis not present

## 2016-05-15 DIAGNOSIS — R1031 Right lower quadrant pain: Secondary | ICD-10-CM | POA: Diagnosis not present

## 2016-05-15 DIAGNOSIS — R1011 Right upper quadrant pain: Secondary | ICD-10-CM | POA: Diagnosis not present

## 2016-05-15 DIAGNOSIS — Z6837 Body mass index (BMI) 37.0-37.9, adult: Secondary | ICD-10-CM | POA: Diagnosis not present

## 2016-05-15 DIAGNOSIS — E871 Hypo-osmolality and hyponatremia: Secondary | ICD-10-CM | POA: Diagnosis not present

## 2016-05-15 DIAGNOSIS — R109 Unspecified abdominal pain: Secondary | ICD-10-CM | POA: Diagnosis not present

## 2016-06-15 DIAGNOSIS — M797 Fibromyalgia: Secondary | ICD-10-CM | POA: Diagnosis not present

## 2016-06-15 DIAGNOSIS — E871 Hypo-osmolality and hyponatremia: Secondary | ICD-10-CM | POA: Diagnosis not present

## 2016-06-15 DIAGNOSIS — R109 Unspecified abdominal pain: Secondary | ICD-10-CM | POA: Diagnosis not present

## 2016-06-30 DIAGNOSIS — R1030 Lower abdominal pain, unspecified: Secondary | ICD-10-CM | POA: Diagnosis not present

## 2016-06-30 DIAGNOSIS — K5900 Constipation, unspecified: Secondary | ICD-10-CM | POA: Diagnosis not present

## 2016-06-30 DIAGNOSIS — D649 Anemia, unspecified: Secondary | ICD-10-CM | POA: Diagnosis not present

## 2016-07-06 DIAGNOSIS — R05 Cough: Secondary | ICD-10-CM | POA: Diagnosis not present

## 2016-07-06 DIAGNOSIS — F329 Major depressive disorder, single episode, unspecified: Secondary | ICD-10-CM | POA: Diagnosis not present

## 2016-07-06 DIAGNOSIS — J029 Acute pharyngitis, unspecified: Secondary | ICD-10-CM | POA: Diagnosis not present

## 2016-07-13 DIAGNOSIS — D259 Leiomyoma of uterus, unspecified: Secondary | ICD-10-CM | POA: Diagnosis not present

## 2016-07-13 DIAGNOSIS — R1030 Lower abdominal pain, unspecified: Secondary | ICD-10-CM | POA: Diagnosis not present

## 2016-07-13 DIAGNOSIS — I700 Atherosclerosis of aorta: Secondary | ICD-10-CM | POA: Diagnosis not present

## 2016-07-16 DIAGNOSIS — D500 Iron deficiency anemia secondary to blood loss (chronic): Secondary | ICD-10-CM | POA: Diagnosis not present

## 2016-07-16 DIAGNOSIS — R1030 Lower abdominal pain, unspecified: Secondary | ICD-10-CM | POA: Diagnosis not present

## 2016-07-16 DIAGNOSIS — K5900 Constipation, unspecified: Secondary | ICD-10-CM | POA: Diagnosis not present

## 2016-07-16 DIAGNOSIS — R1013 Epigastric pain: Secondary | ICD-10-CM | POA: Diagnosis not present

## 2016-07-29 DIAGNOSIS — K5900 Constipation, unspecified: Secondary | ICD-10-CM | POA: Diagnosis not present

## 2016-07-29 DIAGNOSIS — R1030 Lower abdominal pain, unspecified: Secondary | ICD-10-CM | POA: Diagnosis not present

## 2016-07-29 DIAGNOSIS — D500 Iron deficiency anemia secondary to blood loss (chronic): Secondary | ICD-10-CM | POA: Diagnosis not present

## 2016-07-29 DIAGNOSIS — R1013 Epigastric pain: Secondary | ICD-10-CM | POA: Diagnosis not present

## 2016-08-06 DIAGNOSIS — R6889 Other general symptoms and signs: Secondary | ICD-10-CM | POA: Diagnosis not present

## 2016-08-06 DIAGNOSIS — D649 Anemia, unspecified: Secondary | ICD-10-CM | POA: Diagnosis not present

## 2016-08-06 DIAGNOSIS — F411 Generalized anxiety disorder: Secondary | ICD-10-CM | POA: Diagnosis not present

## 2016-08-06 DIAGNOSIS — F329 Major depressive disorder, single episode, unspecified: Secondary | ICD-10-CM | POA: Diagnosis not present

## 2016-08-19 DIAGNOSIS — J069 Acute upper respiratory infection, unspecified: Secondary | ICD-10-CM | POA: Diagnosis not present

## 2016-08-19 DIAGNOSIS — R112 Nausea with vomiting, unspecified: Secondary | ICD-10-CM | POA: Diagnosis not present

## 2016-08-19 DIAGNOSIS — R05 Cough: Secondary | ICD-10-CM | POA: Diagnosis not present

## 2016-08-19 DIAGNOSIS — R0609 Other forms of dyspnea: Secondary | ICD-10-CM | POA: Diagnosis not present

## 2016-08-19 DIAGNOSIS — R0602 Shortness of breath: Secondary | ICD-10-CM | POA: Diagnosis not present

## 2016-09-01 DIAGNOSIS — N390 Urinary tract infection, site not specified: Secondary | ICD-10-CM | POA: Diagnosis not present

## 2016-09-01 DIAGNOSIS — K5900 Constipation, unspecified: Secondary | ICD-10-CM | POA: Diagnosis not present

## 2016-09-01 DIAGNOSIS — D500 Iron deficiency anemia secondary to blood loss (chronic): Secondary | ICD-10-CM | POA: Diagnosis not present

## 2016-09-01 DIAGNOSIS — R1030 Lower abdominal pain, unspecified: Secondary | ICD-10-CM | POA: Diagnosis not present

## 2016-09-21 DIAGNOSIS — R1030 Lower abdominal pain, unspecified: Secondary | ICD-10-CM | POA: Diagnosis not present

## 2016-09-21 DIAGNOSIS — D259 Leiomyoma of uterus, unspecified: Secondary | ICD-10-CM | POA: Diagnosis not present

## 2016-09-29 DIAGNOSIS — R1030 Lower abdominal pain, unspecified: Secondary | ICD-10-CM | POA: Diagnosis not present

## 2016-09-29 DIAGNOSIS — D259 Leiomyoma of uterus, unspecified: Secondary | ICD-10-CM | POA: Diagnosis not present

## 2016-10-05 DIAGNOSIS — E785 Hyperlipidemia, unspecified: Secondary | ICD-10-CM | POA: Diagnosis not present

## 2016-10-05 DIAGNOSIS — F419 Anxiety disorder, unspecified: Secondary | ICD-10-CM | POA: Diagnosis not present

## 2016-10-05 DIAGNOSIS — R7303 Prediabetes: Secondary | ICD-10-CM | POA: Diagnosis not present

## 2016-10-05 DIAGNOSIS — M797 Fibromyalgia: Secondary | ICD-10-CM | POA: Diagnosis not present

## 2016-10-05 DIAGNOSIS — F329 Major depressive disorder, single episode, unspecified: Secondary | ICD-10-CM | POA: Diagnosis not present

## 2016-11-01 IMAGING — CT CT TIBIA FIBULA *R* W/O CM
2 of 7 series · 5 of 33 positions shown, 6 images · non-contrast
Comparison: Radiographs 09/18/2014.

CLINICAL DATA: Osteomyelitis. Necrotizing fasciitis. New onset
blisters in the RIGHT lower extremity.

EXAM:
CT OF THE RIGHT TIBIA FIBULA WITHOUT CONTRAST
TECHNIQUE: Multidetector CT imaging was performed according to the standard
protocol. Multiplanar CT image reconstructions were also generated.

[Series 2010: coronal s.t. · coronal · 0.32mm/px · 2 of 71 slices shown]
[im 24/71  bone]
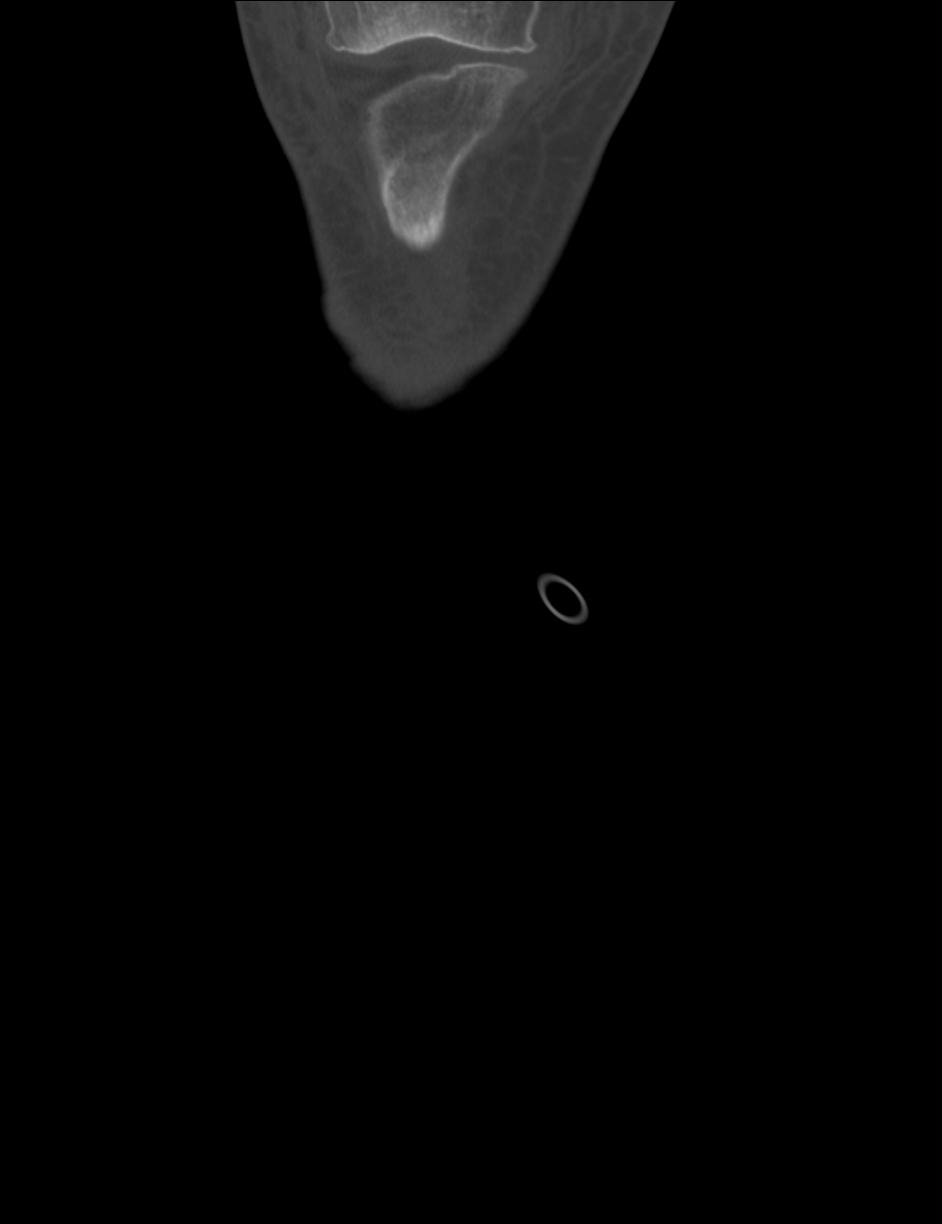
[im 47/71  bone]
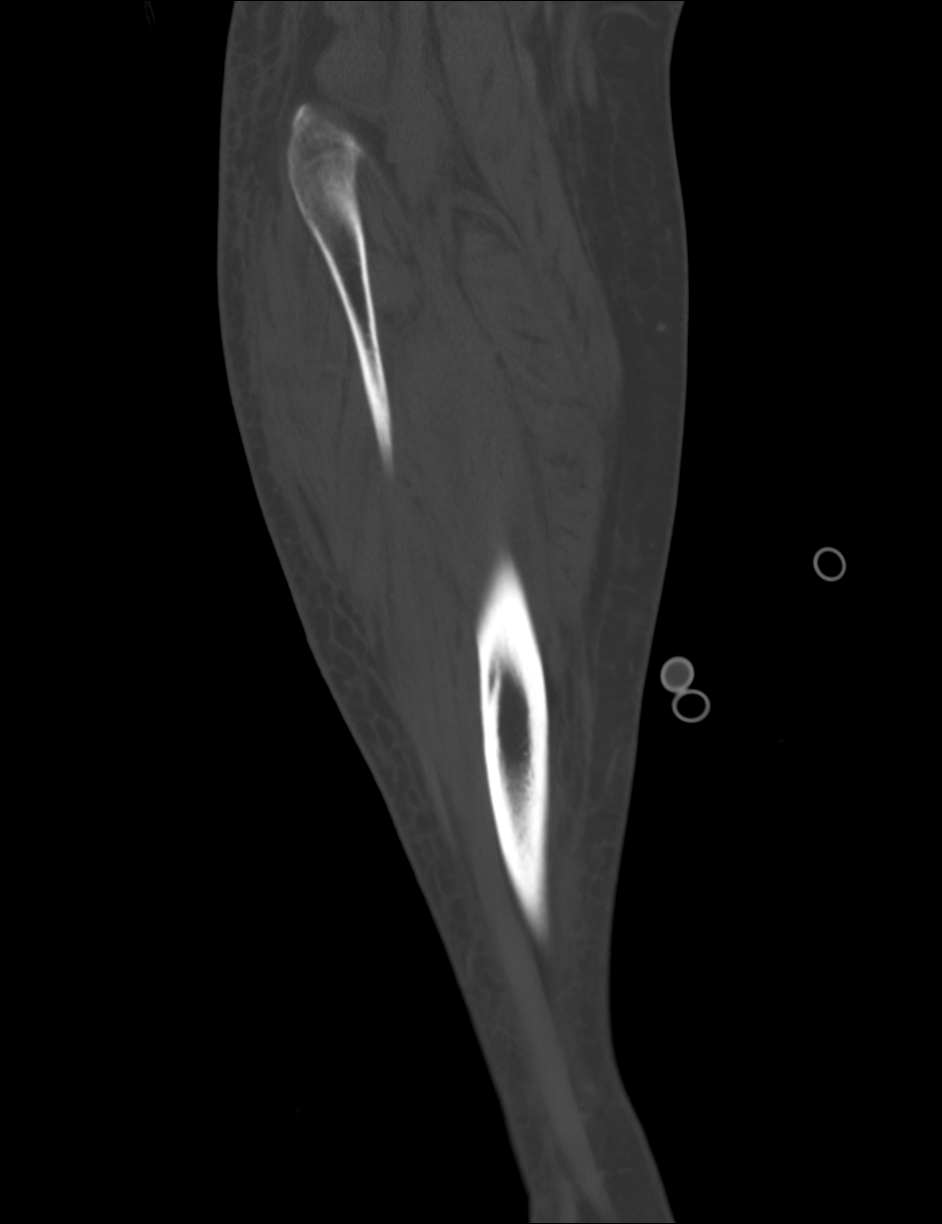

[Series 2011: axial s.t. · axial · 0.32mm/px · z∈[-516,-130]mm · 3 of 156 slices shown, 4 images]
[im 1/156  soft-tissue]
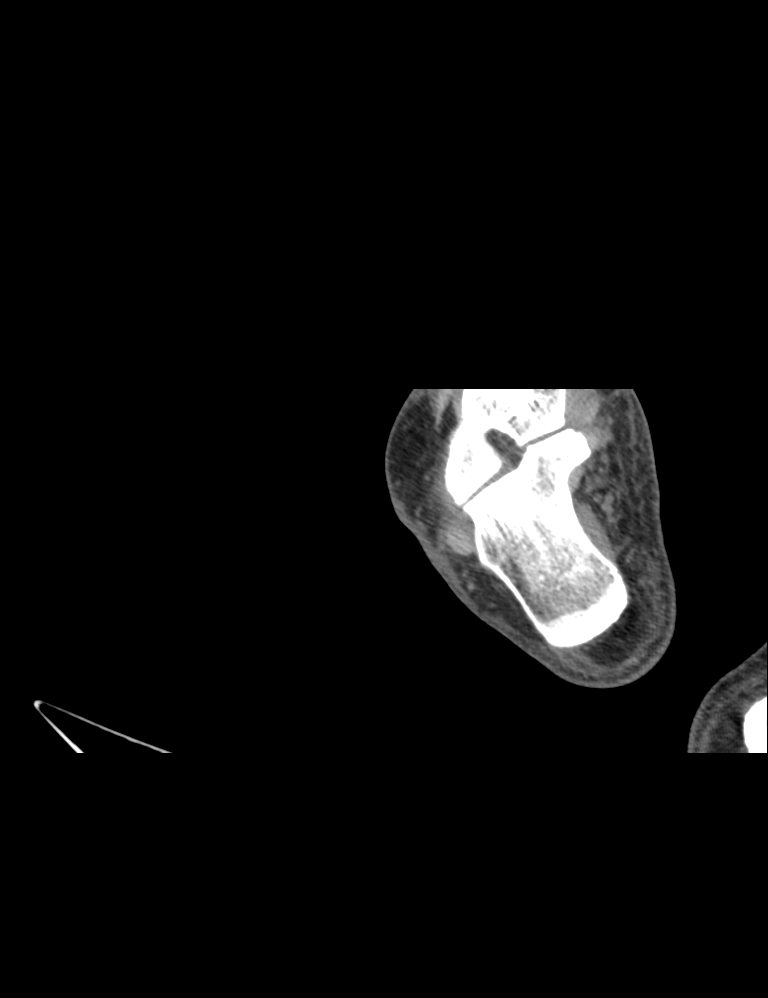
[im 1/156  bone]
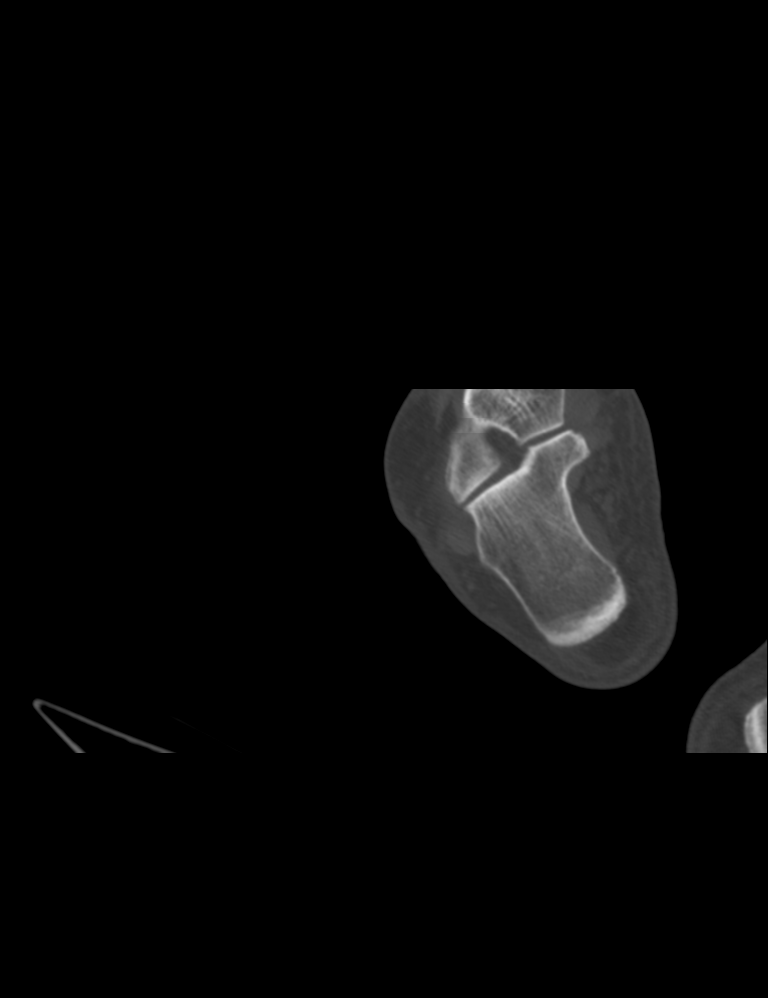
[im 78/156  bone]
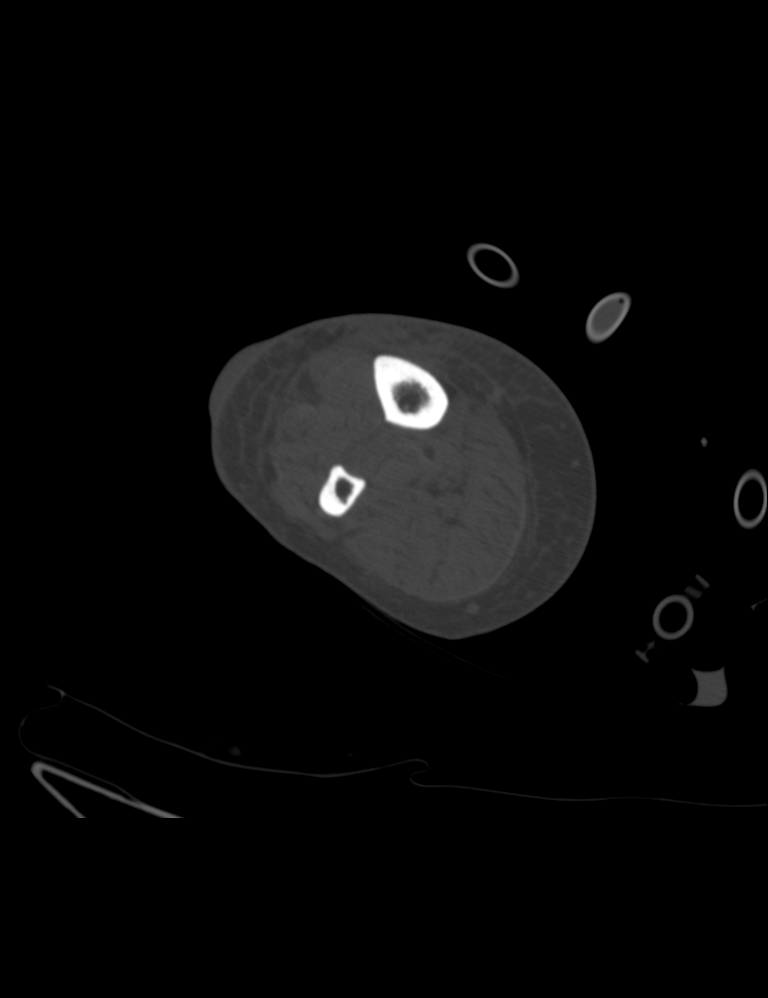
[im 156/156  bone]
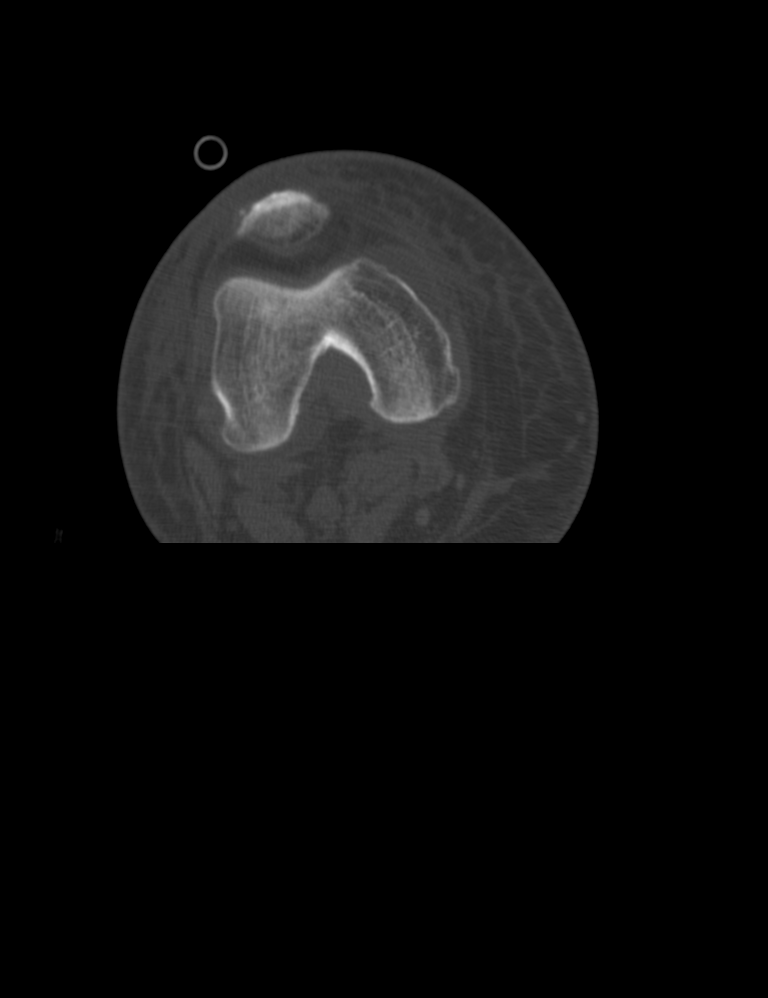

[5 of 33 positions shown; findings below may reference images not displayed]

FINDINGS: There is no gas in the soft tissues of the RIGHT lower extremity.
Pretibial edema is present which is nonspecific. Ankle tendons
appear within normal limits. Insertional calcaneal enthesopathy is
incidentally noted. There is no soft tissue abscess. The tibia and
fibula are normal, without osteomyelitis. Skin thickening is present
anteriorly in the pretibial area act, compatible with a areas of
blistering.
IMPRESSION: Negative for osteomyelitis or necrotizing fasciitis. Superficial
edema and blistering in the anterior RIGHT leg.

## 2016-11-02 IMAGING — CR DG CHEST 1V PORT
1 series · 1 of 1 positions shown · non-contrast
Comparison: 09/19/2014

CLINICAL DATA: Acute respiratory failure with hypoxia. Shock. Acute
renal failure.

EXAM:
PORTABLE CHEST - 1 VIEW

[AP]
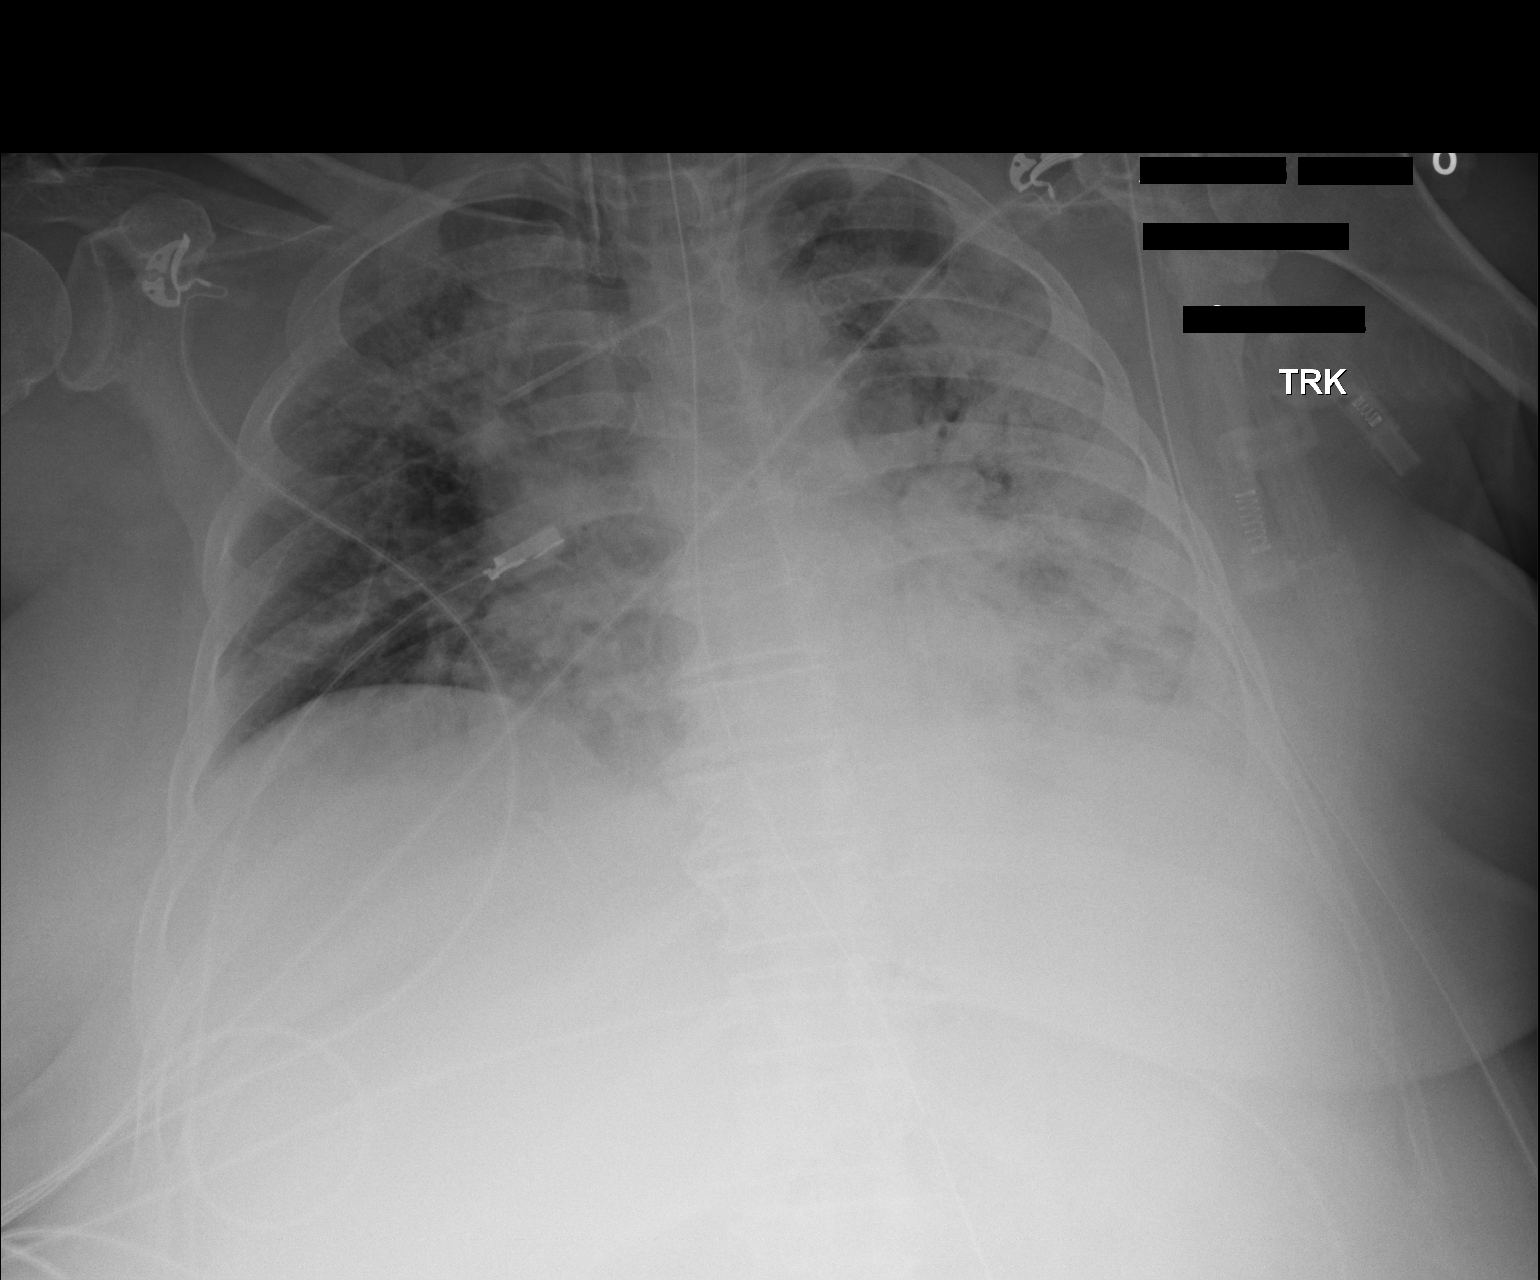

[1 of 1 positions shown; findings below may reference images not displayed]

FINDINGS: Support lines and tubes in appropriate position. No pneumothorax
identified.

Worsening airspace disease is seen throughout the majority of the
left lung and in the right upper lobe. Low lung volumes again noted.
Heart size remains stable.
IMPRESSION: Worsening asymmetric airspace disease throughout left lung and right
upper lobe.

## 2016-11-03 IMAGING — CR DG CHEST 1V PORT
1 series · 1 of 1 positions shown · non-contrast
Comparison: September 20, 2014

CLINICAL DATA: Hypoxia

EXAM:
PORTABLE CHEST - 1 VIEW

[AP]
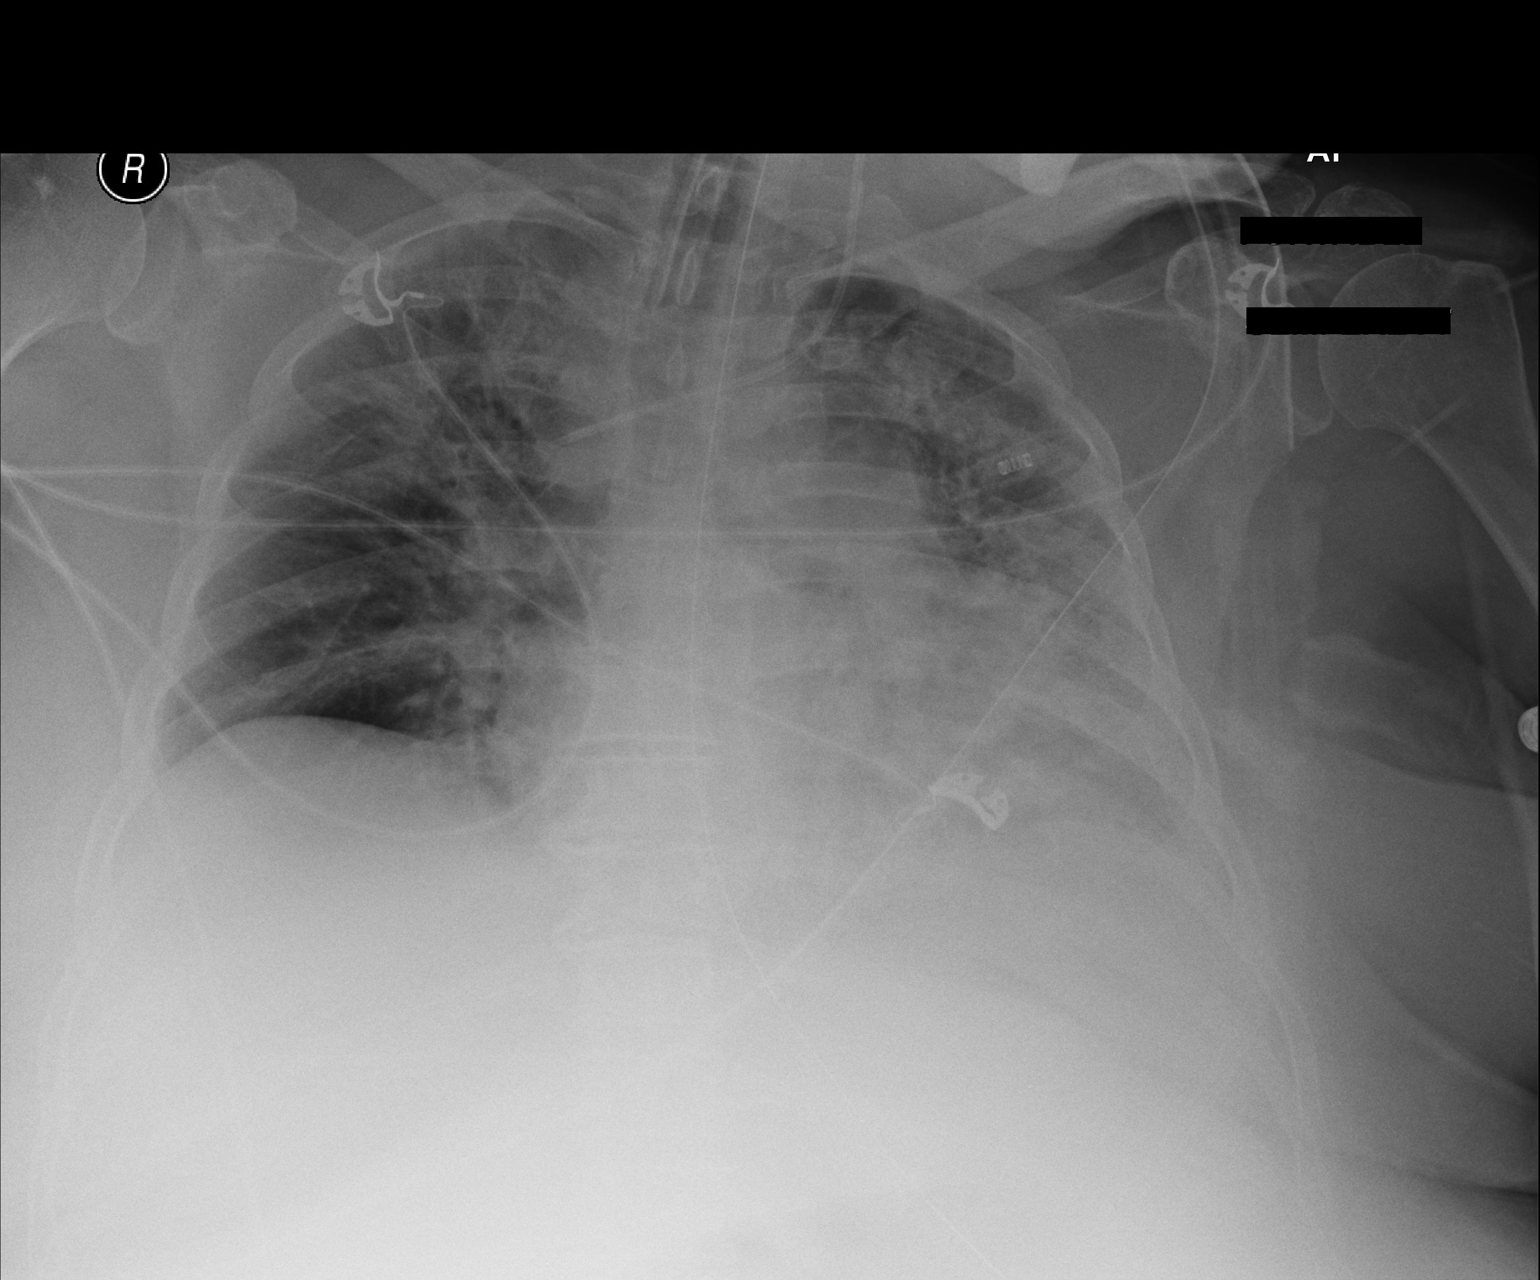

[1 of 1 positions shown; findings below may reference images not displayed]

FINDINGS: Endotracheal tube tip is 4.5 cm above the carina. Central catheter
tip is in the superior cava slightly beyond the junction with the
left innominate vein. Nasogastric tube tip and side port are in the
stomach. No pneumothorax. There is slightly less alveolar
consolidation on the left compared to 1 day prior. There is mild
atelectasis in the lung right upper lobe. Right lung is otherwise
clear. Heart is mildly enlarged with pulmonary vascularity within
normal limits.
IMPRESSION: Tube and catheter positions as described without pneumothorax. There
is patchy consolidation throughout the left lung, less pronounced
compared to 1 day prior. Mild atelectasis right upper lobe. No new
opacity. No change in cardiac silhouette.

## 2016-11-04 IMAGING — CR DG CHEST 1V PORT
1 series · 1 of 1 positions shown · non-contrast
Comparison: 09/22/2014

CLINICAL DATA: Acute respiratory failure, check HD catheter
placement

EXAM:
PORTABLE CHEST - 1 VIEW

[AP]
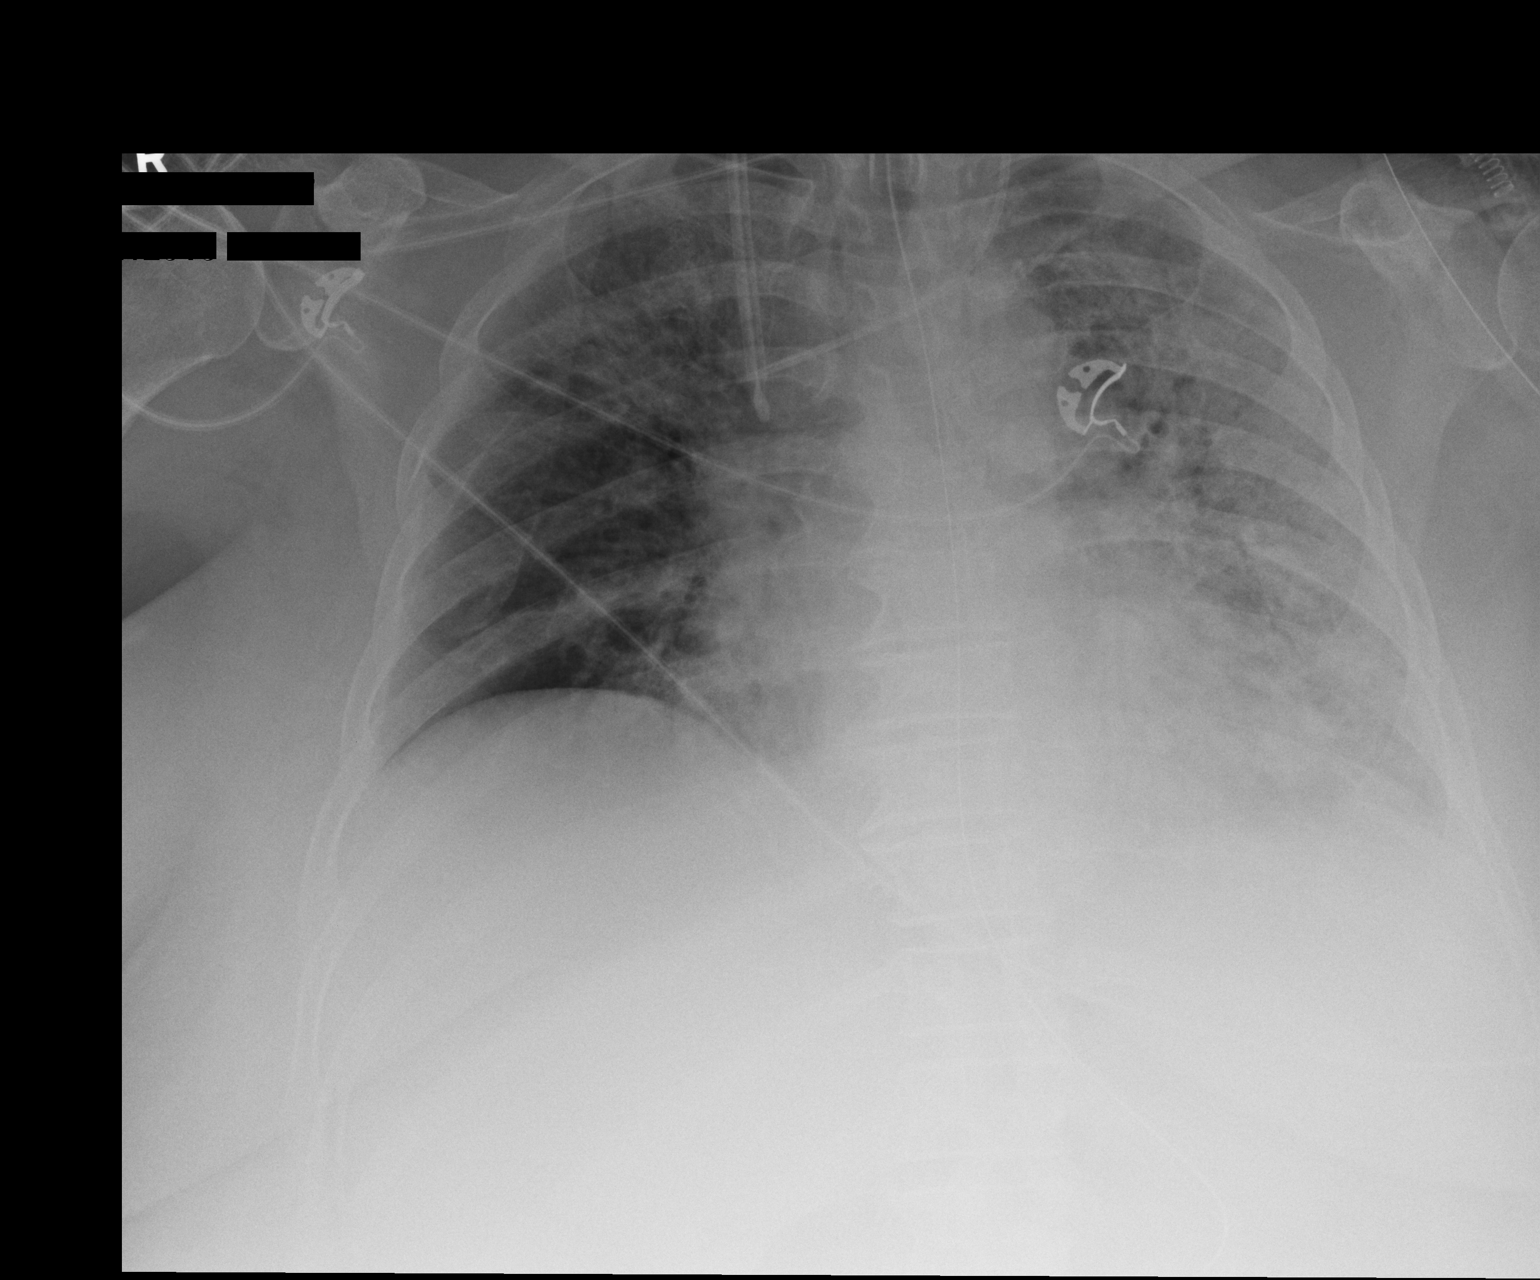

[1 of 1 positions shown; findings below may reference images not displayed]

FINDINGS: Multifocal patchy opacities in the left lung and right upper lobe,
suspicious for multifocal pneumonia.

No pleural effusion or pneumothorax.

Endotracheal tube terminates 4.5 cm above the carina.

Left IJ venous catheter terminates in the SVC. Right IJ hemodialysis
catheter terminates in the mid SVC.
IMPRESSION: Right IJ hemodialysis catheter terminates in the mid SVC.

Endotracheal tube terminates 4.5 cm above the carina.

Multifocal patchy opacities in the left lung and right upper lobe,
suspicious for multifocal pneumonia.

## 2016-11-06 IMAGING — CR DG CHEST 1V PORT
1 series · 1 of 1 positions shown · non-contrast
Comparison: 09/15/2014.

CLINICAL DATA: Respiratory failure.

EXAM:
PORTABLE CHEST - 1 VIEW

[AP]
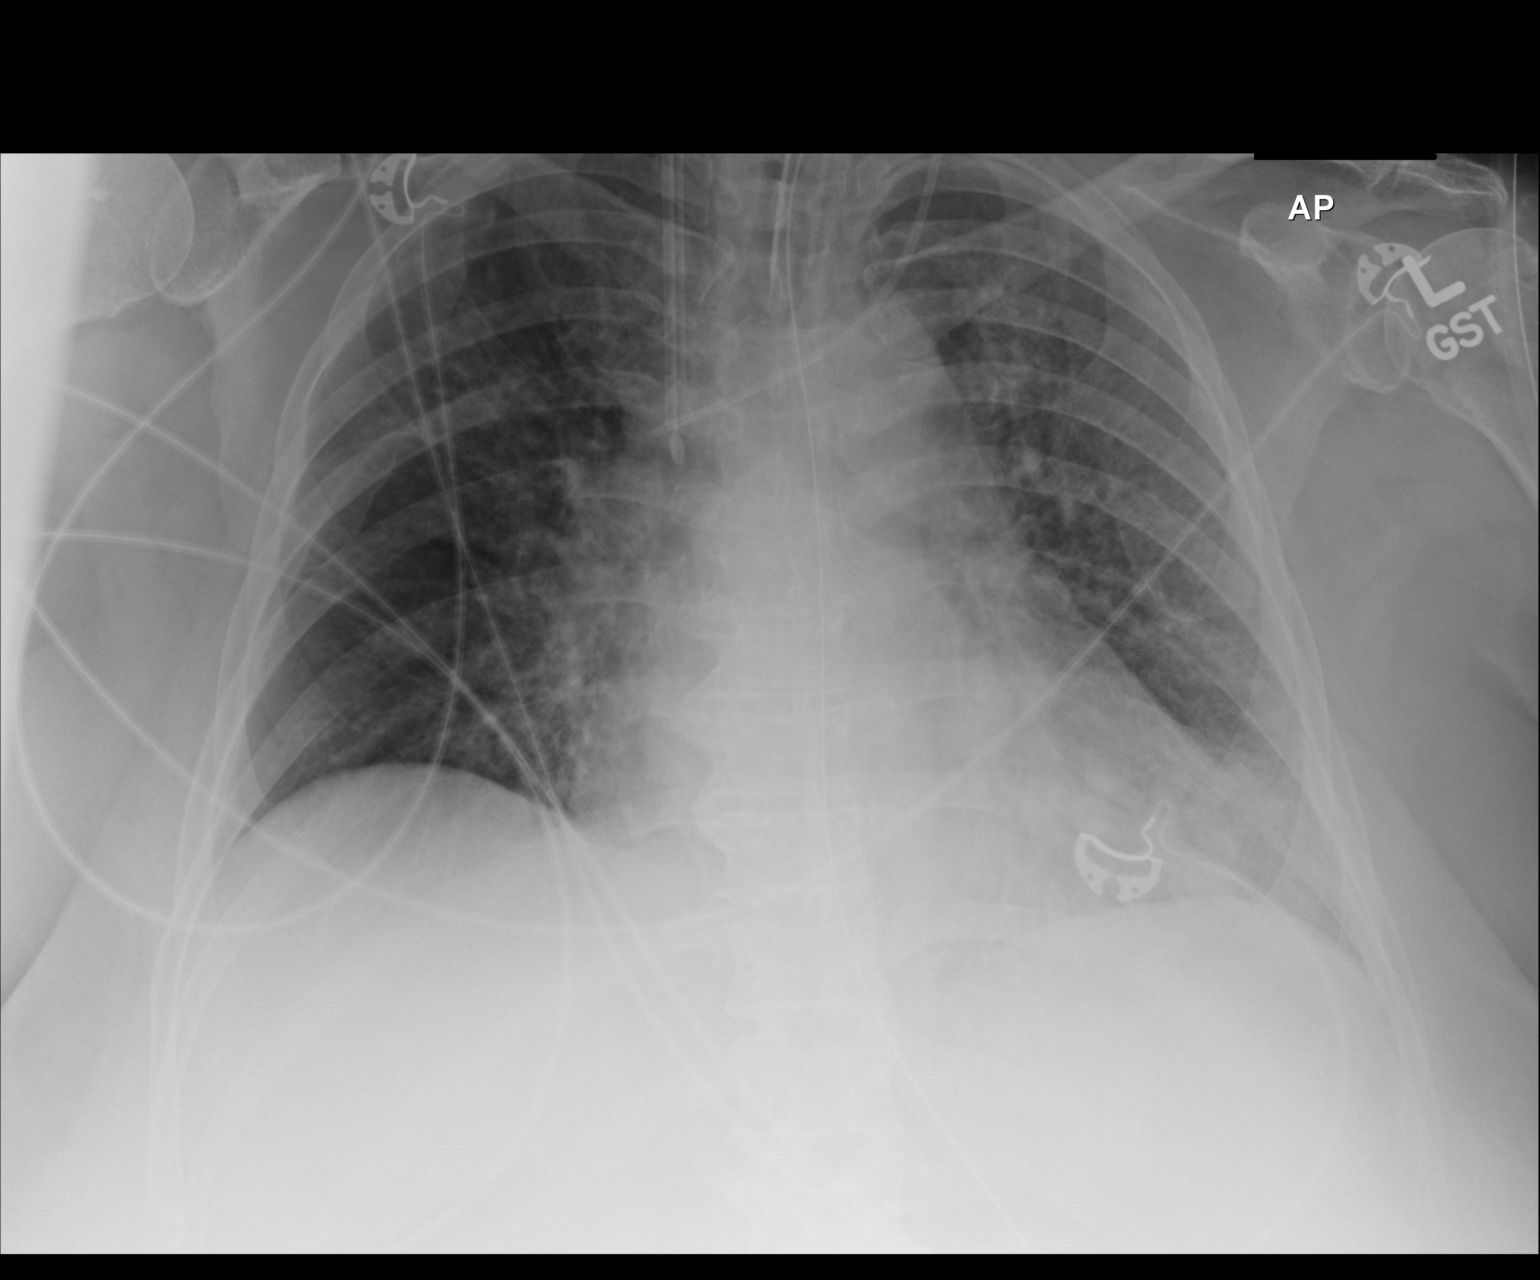

[1 of 1 positions shown; findings below may reference images not displayed]

FINDINGS: Endotracheal tube, NG tube, bilateral NG tubes in stable position.
Stable cardiomegaly with normal pulmonary vascularity. Interim
partial clearing of bilateral pulmonary infiltrates. No pleural
effusion or pneumothorax. No acute osseous abnormality.
IMPRESSION: 1. Lines and tubes in stable position.
2. Interim partial clearing of bilateral pulmonary infiltrates.

## 2017-01-05 DIAGNOSIS — F329 Major depressive disorder, single episode, unspecified: Secondary | ICD-10-CM | POA: Diagnosis not present

## 2017-01-05 DIAGNOSIS — G629 Polyneuropathy, unspecified: Secondary | ICD-10-CM | POA: Diagnosis not present

## 2017-01-05 DIAGNOSIS — D500 Iron deficiency anemia secondary to blood loss (chronic): Secondary | ICD-10-CM | POA: Diagnosis not present

## 2017-01-05 DIAGNOSIS — G43909 Migraine, unspecified, not intractable, without status migrainosus: Secondary | ICD-10-CM | POA: Diagnosis not present

## 2017-01-05 DIAGNOSIS — F411 Generalized anxiety disorder: Secondary | ICD-10-CM | POA: Diagnosis not present

## 2017-01-07 DIAGNOSIS — K5900 Constipation, unspecified: Secondary | ICD-10-CM | POA: Diagnosis not present

## 2017-01-07 DIAGNOSIS — Z1212 Encounter for screening for malignant neoplasm of rectum: Secondary | ICD-10-CM | POA: Diagnosis not present

## 2017-01-07 DIAGNOSIS — D500 Iron deficiency anemia secondary to blood loss (chronic): Secondary | ICD-10-CM | POA: Diagnosis not present

## 2017-01-07 DIAGNOSIS — R1030 Lower abdominal pain, unspecified: Secondary | ICD-10-CM | POA: Diagnosis not present

## 2017-03-22 DIAGNOSIS — R42 Dizziness and giddiness: Secondary | ICD-10-CM | POA: Diagnosis not present

## 2017-03-22 DIAGNOSIS — N390 Urinary tract infection, site not specified: Secondary | ICD-10-CM | POA: Diagnosis not present

## 2017-04-07 DIAGNOSIS — Z124 Encounter for screening for malignant neoplasm of cervix: Secondary | ICD-10-CM | POA: Diagnosis not present

## 2017-04-07 DIAGNOSIS — Z0000 Encounter for general adult medical examination without abnormal findings: Secondary | ICD-10-CM | POA: Diagnosis not present

## 2017-04-08 DIAGNOSIS — I10 Essential (primary) hypertension: Secondary | ICD-10-CM | POA: Diagnosis not present

## 2017-04-08 DIAGNOSIS — G43909 Migraine, unspecified, not intractable, without status migrainosus: Secondary | ICD-10-CM | POA: Diagnosis not present

## 2017-04-08 DIAGNOSIS — E785 Hyperlipidemia, unspecified: Secondary | ICD-10-CM | POA: Diagnosis not present

## 2017-04-08 DIAGNOSIS — Z1389 Encounter for screening for other disorder: Secondary | ICD-10-CM | POA: Diagnosis not present

## 2017-04-08 DIAGNOSIS — F419 Anxiety disorder, unspecified: Secondary | ICD-10-CM | POA: Diagnosis not present

## 2017-04-08 DIAGNOSIS — R42 Dizziness and giddiness: Secondary | ICD-10-CM | POA: Diagnosis not present

## 2017-04-08 DIAGNOSIS — Z0000 Encounter for general adult medical examination without abnormal findings: Secondary | ICD-10-CM | POA: Diagnosis not present

## 2017-04-08 DIAGNOSIS — M8580 Other specified disorders of bone density and structure, unspecified site: Secondary | ICD-10-CM | POA: Diagnosis not present

## 2017-04-08 DIAGNOSIS — G629 Polyneuropathy, unspecified: Secondary | ICD-10-CM | POA: Diagnosis not present

## 2017-04-08 DIAGNOSIS — M797 Fibromyalgia: Secondary | ICD-10-CM | POA: Diagnosis not present

## 2017-04-08 DIAGNOSIS — D649 Anemia, unspecified: Secondary | ICD-10-CM | POA: Diagnosis not present

## 2017-04-08 DIAGNOSIS — Z23 Encounter for immunization: Secondary | ICD-10-CM | POA: Diagnosis not present

## 2017-05-05 DIAGNOSIS — M8588 Other specified disorders of bone density and structure, other site: Secondary | ICD-10-CM | POA: Diagnosis not present

## 2017-05-05 DIAGNOSIS — Z1231 Encounter for screening mammogram for malignant neoplasm of breast: Secondary | ICD-10-CM | POA: Diagnosis not present

## 2017-05-05 DIAGNOSIS — Z780 Asymptomatic menopausal state: Secondary | ICD-10-CM | POA: Diagnosis not present

## 2017-05-10 DIAGNOSIS — G4700 Insomnia, unspecified: Secondary | ICD-10-CM | POA: Diagnosis not present

## 2017-05-10 DIAGNOSIS — M8580 Other specified disorders of bone density and structure, unspecified site: Secondary | ICD-10-CM | POA: Diagnosis not present

## 2017-05-10 DIAGNOSIS — F329 Major depressive disorder, single episode, unspecified: Secondary | ICD-10-CM | POA: Diagnosis not present

## 2017-05-11 DIAGNOSIS — D500 Iron deficiency anemia secondary to blood loss (chronic): Secondary | ICD-10-CM | POA: Diagnosis not present

## 2017-06-10 DIAGNOSIS — F411 Generalized anxiety disorder: Secondary | ICD-10-CM | POA: Diagnosis not present

## 2017-06-10 DIAGNOSIS — F329 Major depressive disorder, single episode, unspecified: Secondary | ICD-10-CM | POA: Diagnosis not present

## 2017-07-08 DIAGNOSIS — R05 Cough: Secondary | ICD-10-CM | POA: Diagnosis not present

## 2017-07-08 DIAGNOSIS — F419 Anxiety disorder, unspecified: Secondary | ICD-10-CM | POA: Diagnosis not present

## 2017-07-08 DIAGNOSIS — Z5181 Encounter for therapeutic drug level monitoring: Secondary | ICD-10-CM | POA: Diagnosis not present

## 2017-07-08 DIAGNOSIS — Z79899 Other long term (current) drug therapy: Secondary | ICD-10-CM | POA: Diagnosis not present

## 2017-08-09 DIAGNOSIS — J40 Bronchitis, not specified as acute or chronic: Secondary | ICD-10-CM | POA: Diagnosis not present

## 2017-08-09 DIAGNOSIS — F411 Generalized anxiety disorder: Secondary | ICD-10-CM | POA: Diagnosis not present

## 2017-08-09 DIAGNOSIS — E785 Hyperlipidemia, unspecified: Secondary | ICD-10-CM | POA: Diagnosis not present

## 2017-08-09 DIAGNOSIS — Z79899 Other long term (current) drug therapy: Secondary | ICD-10-CM | POA: Diagnosis not present

## 2017-08-09 DIAGNOSIS — G43909 Migraine, unspecified, not intractable, without status migrainosus: Secondary | ICD-10-CM | POA: Diagnosis not present

## 2017-08-09 DIAGNOSIS — K589 Irritable bowel syndrome without diarrhea: Secondary | ICD-10-CM | POA: Diagnosis not present

## 2017-08-09 DIAGNOSIS — Z5181 Encounter for therapeutic drug level monitoring: Secondary | ICD-10-CM | POA: Diagnosis not present

## 2017-08-26 DIAGNOSIS — R05 Cough: Secondary | ICD-10-CM | POA: Diagnosis not present

## 2017-08-26 DIAGNOSIS — S0990XA Unspecified injury of head, initial encounter: Secondary | ICD-10-CM | POA: Diagnosis not present

## 2017-08-26 DIAGNOSIS — S3982XA Other specified injuries of lower back, initial encounter: Secondary | ICD-10-CM | POA: Diagnosis not present

## 2017-08-26 DIAGNOSIS — R102 Pelvic and perineal pain: Secondary | ICD-10-CM | POA: Diagnosis not present

## 2017-08-26 DIAGNOSIS — R0602 Shortness of breath: Secondary | ICD-10-CM | POA: Diagnosis not present

## 2017-08-26 DIAGNOSIS — I951 Orthostatic hypotension: Secondary | ICD-10-CM | POA: Diagnosis not present

## 2017-08-26 DIAGNOSIS — T465X5A Adverse effect of other antihypertensive drugs, initial encounter: Secondary | ICD-10-CM | POA: Diagnosis not present

## 2017-08-26 DIAGNOSIS — M545 Low back pain: Secondary | ICD-10-CM | POA: Diagnosis not present

## 2017-08-26 DIAGNOSIS — S3993XA Unspecified injury of pelvis, initial encounter: Secondary | ICD-10-CM | POA: Diagnosis not present

## 2017-08-26 DIAGNOSIS — S299XXA Unspecified injury of thorax, initial encounter: Secondary | ICD-10-CM | POA: Diagnosis not present

## 2017-08-26 DIAGNOSIS — S199XXA Unspecified injury of neck, initial encounter: Secondary | ICD-10-CM | POA: Diagnosis not present

## 2017-08-26 DIAGNOSIS — R55 Syncope and collapse: Secondary | ICD-10-CM | POA: Diagnosis not present

## 2017-08-26 DIAGNOSIS — T50905A Adverse effect of unspecified drugs, medicaments and biological substances, initial encounter: Secondary | ICD-10-CM | POA: Diagnosis not present

## 2017-08-30 DIAGNOSIS — J329 Chronic sinusitis, unspecified: Secondary | ICD-10-CM | POA: Diagnosis not present

## 2017-09-08 DIAGNOSIS — Z79899 Other long term (current) drug therapy: Secondary | ICD-10-CM | POA: Diagnosis not present

## 2017-09-08 DIAGNOSIS — Z5181 Encounter for therapeutic drug level monitoring: Secondary | ICD-10-CM | POA: Diagnosis not present

## 2017-09-08 DIAGNOSIS — F321 Major depressive disorder, single episode, moderate: Secondary | ICD-10-CM | POA: Diagnosis not present

## 2017-09-08 DIAGNOSIS — F411 Generalized anxiety disorder: Secondary | ICD-10-CM | POA: Diagnosis not present

## 2017-09-08 DIAGNOSIS — I10 Essential (primary) hypertension: Secondary | ICD-10-CM | POA: Diagnosis not present

## 2017-09-14 DIAGNOSIS — D500 Iron deficiency anemia secondary to blood loss (chronic): Secondary | ICD-10-CM | POA: Diagnosis not present

## 2017-09-16 DIAGNOSIS — K5901 Slow transit constipation: Secondary | ICD-10-CM | POA: Diagnosis not present

## 2017-09-16 DIAGNOSIS — D500 Iron deficiency anemia secondary to blood loss (chronic): Secondary | ICD-10-CM | POA: Diagnosis not present

## 2017-09-27 DIAGNOSIS — J019 Acute sinusitis, unspecified: Secondary | ICD-10-CM | POA: Diagnosis not present

## 2017-09-27 DIAGNOSIS — I10 Essential (primary) hypertension: Secondary | ICD-10-CM | POA: Diagnosis not present

## 2017-09-27 DIAGNOSIS — K1370 Unspecified lesions of oral mucosa: Secondary | ICD-10-CM | POA: Diagnosis not present

## 2017-10-04 DIAGNOSIS — F419 Anxiety disorder, unspecified: Secondary | ICD-10-CM | POA: Diagnosis not present

## 2017-10-04 DIAGNOSIS — Z5181 Encounter for therapeutic drug level monitoring: Secondary | ICD-10-CM | POA: Diagnosis not present

## 2017-10-04 DIAGNOSIS — J019 Acute sinusitis, unspecified: Secondary | ICD-10-CM | POA: Diagnosis not present

## 2017-10-04 DIAGNOSIS — Z79899 Other long term (current) drug therapy: Secondary | ICD-10-CM | POA: Diagnosis not present

## 2017-10-04 DIAGNOSIS — M25552 Pain in left hip: Secondary | ICD-10-CM | POA: Diagnosis not present

## 2017-10-04 DIAGNOSIS — J320 Chronic maxillary sinusitis: Secondary | ICD-10-CM | POA: Diagnosis not present

## 2017-10-04 DIAGNOSIS — M545 Low back pain: Secondary | ICD-10-CM | POA: Diagnosis not present

## 2017-10-11 DIAGNOSIS — J320 Chronic maxillary sinusitis: Secondary | ICD-10-CM | POA: Diagnosis not present

## 2017-10-11 DIAGNOSIS — J342 Deviated nasal septum: Secondary | ICD-10-CM | POA: Diagnosis not present

## 2017-10-11 DIAGNOSIS — J343 Hypertrophy of nasal turbinates: Secondary | ICD-10-CM | POA: Diagnosis not present

## 2017-10-11 DIAGNOSIS — R0981 Nasal congestion: Secondary | ICD-10-CM | POA: Diagnosis not present

## 2017-10-11 DIAGNOSIS — J3489 Other specified disorders of nose and nasal sinuses: Secondary | ICD-10-CM | POA: Diagnosis not present

## 2017-11-01 DIAGNOSIS — F411 Generalized anxiety disorder: Secondary | ICD-10-CM | POA: Diagnosis not present

## 2017-11-01 DIAGNOSIS — J329 Chronic sinusitis, unspecified: Secondary | ICD-10-CM | POA: Diagnosis not present

## 2017-11-01 DIAGNOSIS — I10 Essential (primary) hypertension: Secondary | ICD-10-CM | POA: Diagnosis not present

## 2017-11-01 DIAGNOSIS — Z79899 Other long term (current) drug therapy: Secondary | ICD-10-CM | POA: Diagnosis not present

## 2017-11-01 DIAGNOSIS — F329 Major depressive disorder, single episode, unspecified: Secondary | ICD-10-CM | POA: Diagnosis not present

## 2017-11-01 DIAGNOSIS — Z5181 Encounter for therapeutic drug level monitoring: Secondary | ICD-10-CM | POA: Diagnosis not present

## 2017-11-02 DIAGNOSIS — I10 Essential (primary) hypertension: Secondary | ICD-10-CM | POA: Diagnosis not present

## 2017-11-30 DIAGNOSIS — J329 Chronic sinusitis, unspecified: Secondary | ICD-10-CM | POA: Diagnosis not present

## 2017-11-30 DIAGNOSIS — J342 Deviated nasal septum: Secondary | ICD-10-CM | POA: Diagnosis not present

## 2017-11-30 DIAGNOSIS — J343 Hypertrophy of nasal turbinates: Secondary | ICD-10-CM | POA: Diagnosis not present

## 2017-11-30 DIAGNOSIS — R0981 Nasal congestion: Secondary | ICD-10-CM | POA: Diagnosis not present

## 2017-11-30 DIAGNOSIS — J320 Chronic maxillary sinusitis: Secondary | ICD-10-CM | POA: Diagnosis not present

## 2017-11-30 DIAGNOSIS — J3489 Other specified disorders of nose and nasal sinuses: Secondary | ICD-10-CM | POA: Diagnosis not present

## 2017-11-30 DIAGNOSIS — I10 Essential (primary) hypertension: Secondary | ICD-10-CM | POA: Diagnosis not present

## 2017-12-08 DIAGNOSIS — D649 Anemia, unspecified: Secondary | ICD-10-CM | POA: Diagnosis not present

## 2017-12-08 DIAGNOSIS — J0101 Acute recurrent maxillary sinusitis: Secondary | ICD-10-CM | POA: Diagnosis not present

## 2017-12-08 DIAGNOSIS — J3489 Other specified disorders of nose and nasal sinuses: Secondary | ICD-10-CM | POA: Diagnosis not present

## 2017-12-08 DIAGNOSIS — J343 Hypertrophy of nasal turbinates: Secondary | ICD-10-CM | POA: Diagnosis not present

## 2017-12-08 DIAGNOSIS — J329 Chronic sinusitis, unspecified: Secondary | ICD-10-CM | POA: Diagnosis not present

## 2017-12-08 DIAGNOSIS — I10 Essential (primary) hypertension: Secondary | ICD-10-CM | POA: Diagnosis not present

## 2017-12-08 DIAGNOSIS — R0981 Nasal congestion: Secondary | ICD-10-CM | POA: Diagnosis not present

## 2017-12-08 DIAGNOSIS — J320 Chronic maxillary sinusitis: Secondary | ICD-10-CM | POA: Diagnosis not present

## 2017-12-08 DIAGNOSIS — E871 Hypo-osmolality and hyponatremia: Secondary | ICD-10-CM | POA: Diagnosis not present

## 2017-12-08 DIAGNOSIS — J342 Deviated nasal septum: Secondary | ICD-10-CM | POA: Diagnosis not present

## 2017-12-14 DIAGNOSIS — Z79899 Other long term (current) drug therapy: Secondary | ICD-10-CM | POA: Diagnosis not present

## 2017-12-14 DIAGNOSIS — Z6837 Body mass index (BMI) 37.0-37.9, adult: Secondary | ICD-10-CM | POA: Diagnosis not present

## 2017-12-14 DIAGNOSIS — E669 Obesity, unspecified: Secondary | ICD-10-CM | POA: Diagnosis not present

## 2017-12-14 DIAGNOSIS — B351 Tinea unguium: Secondary | ICD-10-CM | POA: Diagnosis not present

## 2017-12-14 DIAGNOSIS — Z5181 Encounter for therapeutic drug level monitoring: Secondary | ICD-10-CM | POA: Diagnosis not present

## 2017-12-14 DIAGNOSIS — F329 Major depressive disorder, single episode, unspecified: Secondary | ICD-10-CM | POA: Diagnosis not present

## 2017-12-14 DIAGNOSIS — E785 Hyperlipidemia, unspecified: Secondary | ICD-10-CM | POA: Diagnosis not present

## 2017-12-14 DIAGNOSIS — F419 Anxiety disorder, unspecified: Secondary | ICD-10-CM | POA: Diagnosis not present

## 2017-12-17 DIAGNOSIS — J329 Chronic sinusitis, unspecified: Secondary | ICD-10-CM | POA: Diagnosis not present

## 2017-12-17 DIAGNOSIS — J310 Chronic rhinitis: Secondary | ICD-10-CM | POA: Diagnosis not present

## 2017-12-17 DIAGNOSIS — J3489 Other specified disorders of nose and nasal sinuses: Secondary | ICD-10-CM | POA: Diagnosis not present

## 2017-12-21 DIAGNOSIS — R0981 Nasal congestion: Secondary | ICD-10-CM | POA: Diagnosis not present

## 2017-12-21 DIAGNOSIS — J329 Chronic sinusitis, unspecified: Secondary | ICD-10-CM | POA: Diagnosis not present

## 2017-12-21 DIAGNOSIS — J3489 Other specified disorders of nose and nasal sinuses: Secondary | ICD-10-CM | POA: Diagnosis not present

## 2018-01-11 DIAGNOSIS — Z79899 Other long term (current) drug therapy: Secondary | ICD-10-CM | POA: Diagnosis not present

## 2018-01-11 DIAGNOSIS — J329 Chronic sinusitis, unspecified: Secondary | ICD-10-CM | POA: Diagnosis not present

## 2018-01-11 DIAGNOSIS — F419 Anxiety disorder, unspecified: Secondary | ICD-10-CM | POA: Diagnosis not present

## 2018-01-11 DIAGNOSIS — R05 Cough: Secondary | ICD-10-CM | POA: Diagnosis not present

## 2018-01-11 DIAGNOSIS — J040 Acute laryngitis: Secondary | ICD-10-CM | POA: Diagnosis not present

## 2018-01-11 DIAGNOSIS — Z5181 Encounter for therapeutic drug level monitoring: Secondary | ICD-10-CM | POA: Diagnosis not present

## 2018-01-17 DIAGNOSIS — D500 Iron deficiency anemia secondary to blood loss (chronic): Secondary | ICD-10-CM | POA: Diagnosis not present

## 2018-01-20 DIAGNOSIS — D500 Iron deficiency anemia secondary to blood loss (chronic): Secondary | ICD-10-CM | POA: Diagnosis not present

## 2018-01-20 DIAGNOSIS — R1030 Lower abdominal pain, unspecified: Secondary | ICD-10-CM | POA: Diagnosis not present

## 2018-01-20 DIAGNOSIS — K5904 Chronic idiopathic constipation: Secondary | ICD-10-CM | POA: Diagnosis not present

## 2018-01-21 DIAGNOSIS — H2513 Age-related nuclear cataract, bilateral: Secondary | ICD-10-CM | POA: Diagnosis not present

## 2018-01-21 DIAGNOSIS — Z0100 Encounter for examination of eyes and vision without abnormal findings: Secondary | ICD-10-CM | POA: Diagnosis not present

## 2018-02-08 DIAGNOSIS — F419 Anxiety disorder, unspecified: Secondary | ICD-10-CM | POA: Diagnosis not present

## 2018-02-08 DIAGNOSIS — M47816 Spondylosis without myelopathy or radiculopathy, lumbar region: Secondary | ICD-10-CM | POA: Diagnosis not present

## 2018-02-08 DIAGNOSIS — Z5181 Encounter for therapeutic drug level monitoring: Secondary | ICD-10-CM | POA: Diagnosis not present

## 2018-02-08 DIAGNOSIS — G8929 Other chronic pain: Secondary | ICD-10-CM | POA: Diagnosis not present

## 2018-02-08 DIAGNOSIS — Z79899 Other long term (current) drug therapy: Secondary | ICD-10-CM | POA: Diagnosis not present

## 2018-02-08 DIAGNOSIS — M5442 Lumbago with sciatica, left side: Secondary | ICD-10-CM | POA: Diagnosis not present

## 2018-02-08 DIAGNOSIS — M5441 Lumbago with sciatica, right side: Secondary | ICD-10-CM | POA: Diagnosis not present

## 2018-02-08 DIAGNOSIS — Z1389 Encounter for screening for other disorder: Secondary | ICD-10-CM | POA: Diagnosis not present

## 2018-02-08 DIAGNOSIS — R42 Dizziness and giddiness: Secondary | ICD-10-CM | POA: Diagnosis not present

## 2018-02-14 DIAGNOSIS — M545 Low back pain: Secondary | ICD-10-CM | POA: Diagnosis not present

## 2018-02-14 DIAGNOSIS — R296 Repeated falls: Secondary | ICD-10-CM | POA: Diagnosis not present

## 2018-02-14 DIAGNOSIS — R42 Dizziness and giddiness: Secondary | ICD-10-CM | POA: Diagnosis not present

## 2018-02-14 DIAGNOSIS — R2689 Other abnormalities of gait and mobility: Secondary | ICD-10-CM | POA: Diagnosis not present

## 2018-02-24 DIAGNOSIS — R2689 Other abnormalities of gait and mobility: Secondary | ICD-10-CM | POA: Diagnosis not present

## 2018-02-24 DIAGNOSIS — R42 Dizziness and giddiness: Secondary | ICD-10-CM | POA: Diagnosis not present

## 2018-02-24 DIAGNOSIS — R296 Repeated falls: Secondary | ICD-10-CM | POA: Diagnosis not present

## 2018-02-24 DIAGNOSIS — M545 Low back pain: Secondary | ICD-10-CM | POA: Diagnosis not present

## 2018-03-03 DIAGNOSIS — M545 Low back pain: Secondary | ICD-10-CM | POA: Diagnosis not present

## 2018-03-03 DIAGNOSIS — R296 Repeated falls: Secondary | ICD-10-CM | POA: Diagnosis not present

## 2018-03-03 DIAGNOSIS — R42 Dizziness and giddiness: Secondary | ICD-10-CM | POA: Diagnosis not present

## 2018-03-03 DIAGNOSIS — R2689 Other abnormalities of gait and mobility: Secondary | ICD-10-CM | POA: Diagnosis not present

## 2018-03-07 DIAGNOSIS — Z5181 Encounter for therapeutic drug level monitoring: Secondary | ICD-10-CM | POA: Diagnosis not present

## 2018-03-07 DIAGNOSIS — Z79899 Other long term (current) drug therapy: Secondary | ICD-10-CM | POA: Diagnosis not present

## 2018-03-07 DIAGNOSIS — R42 Dizziness and giddiness: Secondary | ICD-10-CM | POA: Diagnosis not present

## 2018-03-07 DIAGNOSIS — G8929 Other chronic pain: Secondary | ICD-10-CM | POA: Diagnosis not present

## 2018-03-24 DIAGNOSIS — Z9889 Other specified postprocedural states: Secondary | ICD-10-CM | POA: Diagnosis not present

## 2018-03-24 DIAGNOSIS — J3489 Other specified disorders of nose and nasal sinuses: Secondary | ICD-10-CM | POA: Diagnosis not present

## 2018-03-24 DIAGNOSIS — J329 Chronic sinusitis, unspecified: Secondary | ICD-10-CM | POA: Diagnosis not present

## 2018-04-01 DIAGNOSIS — M79605 Pain in left leg: Secondary | ICD-10-CM | POA: Diagnosis not present

## 2018-04-01 DIAGNOSIS — M79604 Pain in right leg: Secondary | ICD-10-CM | POA: Diagnosis not present

## 2018-04-04 DIAGNOSIS — E871 Hypo-osmolality and hyponatremia: Secondary | ICD-10-CM | POA: Diagnosis not present

## 2018-04-04 DIAGNOSIS — I509 Heart failure, unspecified: Secondary | ICD-10-CM | POA: Diagnosis not present

## 2018-04-04 DIAGNOSIS — M79605 Pain in left leg: Secondary | ICD-10-CM | POA: Diagnosis not present

## 2018-04-04 DIAGNOSIS — R079 Chest pain, unspecified: Secondary | ICD-10-CM | POA: Diagnosis not present

## 2018-04-04 DIAGNOSIS — F418 Other specified anxiety disorders: Secondary | ICD-10-CM | POA: Diagnosis not present

## 2018-04-04 DIAGNOSIS — R609 Edema, unspecified: Secondary | ICD-10-CM | POA: Diagnosis not present

## 2018-04-04 DIAGNOSIS — N3000 Acute cystitis without hematuria: Secondary | ICD-10-CM | POA: Diagnosis not present

## 2018-04-04 DIAGNOSIS — K5900 Constipation, unspecified: Secondary | ICD-10-CM | POA: Diagnosis not present

## 2018-04-04 DIAGNOSIS — R0789 Other chest pain: Secondary | ICD-10-CM | POA: Diagnosis not present

## 2018-04-04 DIAGNOSIS — K581 Irritable bowel syndrome with constipation: Secondary | ICD-10-CM | POA: Diagnosis not present

## 2018-04-04 DIAGNOSIS — M79604 Pain in right leg: Secondary | ICD-10-CM | POA: Diagnosis not present

## 2018-04-04 DIAGNOSIS — K219 Gastro-esophageal reflux disease without esophagitis: Secondary | ICD-10-CM | POA: Diagnosis not present

## 2018-04-04 DIAGNOSIS — N390 Urinary tract infection, site not specified: Secondary | ICD-10-CM

## 2018-04-04 DIAGNOSIS — R296 Repeated falls: Secondary | ICD-10-CM | POA: Diagnosis not present

## 2018-04-04 DIAGNOSIS — R0902 Hypoxemia: Secondary | ICD-10-CM | POA: Diagnosis not present

## 2018-04-04 DIAGNOSIS — G40909 Epilepsy, unspecified, not intractable, without status epilepticus: Secondary | ICD-10-CM | POA: Diagnosis not present

## 2018-04-04 DIAGNOSIS — S3992XA Unspecified injury of lower back, initial encounter: Secondary | ICD-10-CM | POA: Diagnosis not present

## 2018-04-04 DIAGNOSIS — K589 Irritable bowel syndrome without diarrhea: Secondary | ICD-10-CM | POA: Diagnosis not present

## 2018-04-04 DIAGNOSIS — S300XXA Contusion of lower back and pelvis, initial encounter: Secondary | ICD-10-CM | POA: Diagnosis not present

## 2018-04-04 DIAGNOSIS — Z79899 Other long term (current) drug therapy: Secondary | ICD-10-CM | POA: Diagnosis not present

## 2018-04-04 DIAGNOSIS — R42 Dizziness and giddiness: Secondary | ICD-10-CM | POA: Diagnosis not present

## 2018-04-04 DIAGNOSIS — F419 Anxiety disorder, unspecified: Secondary | ICD-10-CM | POA: Diagnosis not present

## 2018-04-04 DIAGNOSIS — M545 Low back pain: Secondary | ICD-10-CM | POA: Diagnosis not present

## 2018-04-04 DIAGNOSIS — I10 Essential (primary) hypertension: Secondary | ICD-10-CM | POA: Diagnosis not present

## 2018-04-04 DIAGNOSIS — R531 Weakness: Secondary | ICD-10-CM | POA: Diagnosis not present

## 2018-04-04 DIAGNOSIS — Z5181 Encounter for therapeutic drug level monitoring: Secondary | ICD-10-CM | POA: Diagnosis not present

## 2018-04-04 DIAGNOSIS — F329 Major depressive disorder, single episode, unspecified: Secondary | ICD-10-CM | POA: Diagnosis not present

## 2018-04-04 DIAGNOSIS — S0003XA Contusion of scalp, initial encounter: Secondary | ICD-10-CM | POA: Diagnosis not present

## 2018-04-04 DIAGNOSIS — J449 Chronic obstructive pulmonary disease, unspecified: Secondary | ICD-10-CM | POA: Diagnosis not present

## 2018-04-05 DIAGNOSIS — F329 Major depressive disorder, single episode, unspecified: Secondary | ICD-10-CM | POA: Diagnosis not present

## 2018-04-05 DIAGNOSIS — J449 Chronic obstructive pulmonary disease, unspecified: Secondary | ICD-10-CM | POA: Diagnosis not present

## 2018-04-05 DIAGNOSIS — R531 Weakness: Secondary | ICD-10-CM | POA: Diagnosis not present

## 2018-04-05 DIAGNOSIS — N390 Urinary tract infection, site not specified: Secondary | ICD-10-CM | POA: Diagnosis not present

## 2018-04-05 DIAGNOSIS — K589 Irritable bowel syndrome without diarrhea: Secondary | ICD-10-CM | POA: Diagnosis not present

## 2018-04-05 DIAGNOSIS — F419 Anxiety disorder, unspecified: Secondary | ICD-10-CM | POA: Diagnosis not present

## 2018-04-05 DIAGNOSIS — K5900 Constipation, unspecified: Secondary | ICD-10-CM | POA: Diagnosis not present

## 2018-04-06 DIAGNOSIS — R531 Weakness: Secondary | ICD-10-CM | POA: Diagnosis not present

## 2018-04-06 DIAGNOSIS — I509 Heart failure, unspecified: Secondary | ICD-10-CM | POA: Diagnosis not present

## 2018-04-06 DIAGNOSIS — E871 Hypo-osmolality and hyponatremia: Secondary | ICD-10-CM | POA: Diagnosis not present

## 2018-04-07 DIAGNOSIS — F419 Anxiety disorder, unspecified: Secondary | ICD-10-CM | POA: Diagnosis not present

## 2018-04-07 DIAGNOSIS — M150 Primary generalized (osteo)arthritis: Secondary | ICD-10-CM | POA: Diagnosis not present

## 2018-04-07 DIAGNOSIS — J449 Chronic obstructive pulmonary disease, unspecified: Secondary | ICD-10-CM | POA: Diagnosis not present

## 2018-04-07 DIAGNOSIS — I10 Essential (primary) hypertension: Secondary | ICD-10-CM | POA: Diagnosis not present

## 2018-04-07 DIAGNOSIS — K581 Irritable bowel syndrome with constipation: Secondary | ICD-10-CM | POA: Diagnosis not present

## 2018-04-07 DIAGNOSIS — G894 Chronic pain syndrome: Secondary | ICD-10-CM | POA: Diagnosis not present

## 2018-04-07 DIAGNOSIS — E871 Hypo-osmolality and hyponatremia: Secondary | ICD-10-CM | POA: Diagnosis not present

## 2018-04-07 DIAGNOSIS — F329 Major depressive disorder, single episode, unspecified: Secondary | ICD-10-CM | POA: Diagnosis not present

## 2018-04-07 DIAGNOSIS — M549 Dorsalgia, unspecified: Secondary | ICD-10-CM | POA: Diagnosis not present

## 2018-04-09 DIAGNOSIS — F329 Major depressive disorder, single episode, unspecified: Secondary | ICD-10-CM | POA: Diagnosis not present

## 2018-04-09 DIAGNOSIS — F419 Anxiety disorder, unspecified: Secondary | ICD-10-CM | POA: Diagnosis not present

## 2018-04-09 DIAGNOSIS — G894 Chronic pain syndrome: Secondary | ICD-10-CM | POA: Diagnosis not present

## 2018-04-09 DIAGNOSIS — K581 Irritable bowel syndrome with constipation: Secondary | ICD-10-CM | POA: Diagnosis not present

## 2018-04-09 DIAGNOSIS — M150 Primary generalized (osteo)arthritis: Secondary | ICD-10-CM | POA: Diagnosis not present

## 2018-04-09 DIAGNOSIS — E871 Hypo-osmolality and hyponatremia: Secondary | ICD-10-CM | POA: Diagnosis not present

## 2018-04-09 DIAGNOSIS — I10 Essential (primary) hypertension: Secondary | ICD-10-CM | POA: Diagnosis not present

## 2018-04-09 DIAGNOSIS — J449 Chronic obstructive pulmonary disease, unspecified: Secondary | ICD-10-CM | POA: Diagnosis not present

## 2018-04-09 DIAGNOSIS — M549 Dorsalgia, unspecified: Secondary | ICD-10-CM | POA: Diagnosis not present

## 2018-04-12 DIAGNOSIS — R531 Weakness: Secondary | ICD-10-CM | POA: Diagnosis not present

## 2018-04-12 DIAGNOSIS — E871 Hypo-osmolality and hyponatremia: Secondary | ICD-10-CM | POA: Diagnosis not present

## 2018-04-12 DIAGNOSIS — J449 Chronic obstructive pulmonary disease, unspecified: Secondary | ICD-10-CM | POA: Diagnosis not present

## 2018-04-19 DIAGNOSIS — K581 Irritable bowel syndrome with constipation: Secondary | ICD-10-CM | POA: Diagnosis not present

## 2018-04-19 DIAGNOSIS — M549 Dorsalgia, unspecified: Secondary | ICD-10-CM | POA: Diagnosis not present

## 2018-04-19 DIAGNOSIS — M150 Primary generalized (osteo)arthritis: Secondary | ICD-10-CM | POA: Diagnosis not present

## 2018-04-19 DIAGNOSIS — I10 Essential (primary) hypertension: Secondary | ICD-10-CM | POA: Diagnosis not present

## 2018-04-19 DIAGNOSIS — F419 Anxiety disorder, unspecified: Secondary | ICD-10-CM | POA: Diagnosis not present

## 2018-04-19 DIAGNOSIS — F329 Major depressive disorder, single episode, unspecified: Secondary | ICD-10-CM | POA: Diagnosis not present

## 2018-04-19 DIAGNOSIS — J449 Chronic obstructive pulmonary disease, unspecified: Secondary | ICD-10-CM | POA: Diagnosis not present

## 2018-04-19 DIAGNOSIS — E871 Hypo-osmolality and hyponatremia: Secondary | ICD-10-CM | POA: Diagnosis not present

## 2018-04-19 DIAGNOSIS — G894 Chronic pain syndrome: Secondary | ICD-10-CM | POA: Diagnosis not present

## 2018-04-20 DIAGNOSIS — I10 Essential (primary) hypertension: Secondary | ICD-10-CM | POA: Diagnosis not present

## 2018-04-20 DIAGNOSIS — G894 Chronic pain syndrome: Secondary | ICD-10-CM | POA: Diagnosis not present

## 2018-04-20 DIAGNOSIS — F419 Anxiety disorder, unspecified: Secondary | ICD-10-CM | POA: Diagnosis not present

## 2018-04-20 DIAGNOSIS — E871 Hypo-osmolality and hyponatremia: Secondary | ICD-10-CM | POA: Diagnosis not present

## 2018-04-20 DIAGNOSIS — M549 Dorsalgia, unspecified: Secondary | ICD-10-CM | POA: Diagnosis not present

## 2018-04-20 DIAGNOSIS — K581 Irritable bowel syndrome with constipation: Secondary | ICD-10-CM | POA: Diagnosis not present

## 2018-04-20 DIAGNOSIS — J449 Chronic obstructive pulmonary disease, unspecified: Secondary | ICD-10-CM | POA: Diagnosis not present

## 2018-04-20 DIAGNOSIS — F329 Major depressive disorder, single episode, unspecified: Secondary | ICD-10-CM | POA: Diagnosis not present

## 2018-04-20 DIAGNOSIS — M150 Primary generalized (osteo)arthritis: Secondary | ICD-10-CM | POA: Diagnosis not present

## 2018-04-21 DIAGNOSIS — M549 Dorsalgia, unspecified: Secondary | ICD-10-CM | POA: Diagnosis not present

## 2018-04-21 DIAGNOSIS — F329 Major depressive disorder, single episode, unspecified: Secondary | ICD-10-CM | POA: Diagnosis not present

## 2018-04-21 DIAGNOSIS — J449 Chronic obstructive pulmonary disease, unspecified: Secondary | ICD-10-CM | POA: Diagnosis not present

## 2018-04-21 DIAGNOSIS — I10 Essential (primary) hypertension: Secondary | ICD-10-CM | POA: Diagnosis not present

## 2018-04-21 DIAGNOSIS — M150 Primary generalized (osteo)arthritis: Secondary | ICD-10-CM | POA: Diagnosis not present

## 2018-04-21 DIAGNOSIS — G894 Chronic pain syndrome: Secondary | ICD-10-CM | POA: Diagnosis not present

## 2018-04-21 DIAGNOSIS — E871 Hypo-osmolality and hyponatremia: Secondary | ICD-10-CM | POA: Diagnosis not present

## 2018-04-21 DIAGNOSIS — F419 Anxiety disorder, unspecified: Secondary | ICD-10-CM | POA: Diagnosis not present

## 2018-04-21 DIAGNOSIS — K581 Irritable bowel syndrome with constipation: Secondary | ICD-10-CM | POA: Diagnosis not present

## 2018-04-22 DIAGNOSIS — F329 Major depressive disorder, single episode, unspecified: Secondary | ICD-10-CM | POA: Diagnosis not present

## 2018-04-22 DIAGNOSIS — E871 Hypo-osmolality and hyponatremia: Secondary | ICD-10-CM | POA: Diagnosis not present

## 2018-04-22 DIAGNOSIS — F419 Anxiety disorder, unspecified: Secondary | ICD-10-CM | POA: Diagnosis not present

## 2018-04-22 DIAGNOSIS — K581 Irritable bowel syndrome with constipation: Secondary | ICD-10-CM | POA: Diagnosis not present

## 2018-04-22 DIAGNOSIS — I10 Essential (primary) hypertension: Secondary | ICD-10-CM | POA: Diagnosis not present

## 2018-04-22 DIAGNOSIS — J449 Chronic obstructive pulmonary disease, unspecified: Secondary | ICD-10-CM | POA: Diagnosis not present

## 2018-04-22 DIAGNOSIS — M549 Dorsalgia, unspecified: Secondary | ICD-10-CM | POA: Diagnosis not present

## 2018-04-22 DIAGNOSIS — M150 Primary generalized (osteo)arthritis: Secondary | ICD-10-CM | POA: Diagnosis not present

## 2018-04-22 DIAGNOSIS — G894 Chronic pain syndrome: Secondary | ICD-10-CM | POA: Diagnosis not present

## 2018-04-25 DIAGNOSIS — E871 Hypo-osmolality and hyponatremia: Secondary | ICD-10-CM | POA: Diagnosis not present

## 2018-04-25 DIAGNOSIS — F419 Anxiety disorder, unspecified: Secondary | ICD-10-CM | POA: Diagnosis not present

## 2018-04-25 DIAGNOSIS — M150 Primary generalized (osteo)arthritis: Secondary | ICD-10-CM | POA: Diagnosis not present

## 2018-04-25 DIAGNOSIS — G894 Chronic pain syndrome: Secondary | ICD-10-CM | POA: Diagnosis not present

## 2018-04-25 DIAGNOSIS — K581 Irritable bowel syndrome with constipation: Secondary | ICD-10-CM | POA: Diagnosis not present

## 2018-04-25 DIAGNOSIS — I10 Essential (primary) hypertension: Secondary | ICD-10-CM | POA: Diagnosis not present

## 2018-04-25 DIAGNOSIS — F329 Major depressive disorder, single episode, unspecified: Secondary | ICD-10-CM | POA: Diagnosis not present

## 2018-04-25 DIAGNOSIS — M549 Dorsalgia, unspecified: Secondary | ICD-10-CM | POA: Diagnosis not present

## 2018-04-25 DIAGNOSIS — J449 Chronic obstructive pulmonary disease, unspecified: Secondary | ICD-10-CM | POA: Diagnosis not present

## 2018-04-27 DIAGNOSIS — K581 Irritable bowel syndrome with constipation: Secondary | ICD-10-CM | POA: Diagnosis not present

## 2018-04-27 DIAGNOSIS — J449 Chronic obstructive pulmonary disease, unspecified: Secondary | ICD-10-CM | POA: Diagnosis not present

## 2018-04-27 DIAGNOSIS — E871 Hypo-osmolality and hyponatremia: Secondary | ICD-10-CM | POA: Diagnosis not present

## 2018-04-27 DIAGNOSIS — M549 Dorsalgia, unspecified: Secondary | ICD-10-CM | POA: Diagnosis not present

## 2018-04-27 DIAGNOSIS — F329 Major depressive disorder, single episode, unspecified: Secondary | ICD-10-CM | POA: Diagnosis not present

## 2018-04-27 DIAGNOSIS — G894 Chronic pain syndrome: Secondary | ICD-10-CM | POA: Diagnosis not present

## 2018-04-27 DIAGNOSIS — I10 Essential (primary) hypertension: Secondary | ICD-10-CM | POA: Diagnosis not present

## 2018-04-27 DIAGNOSIS — F419 Anxiety disorder, unspecified: Secondary | ICD-10-CM | POA: Diagnosis not present

## 2018-04-27 DIAGNOSIS — M150 Primary generalized (osteo)arthritis: Secondary | ICD-10-CM | POA: Diagnosis not present

## 2018-04-29 DIAGNOSIS — F329 Major depressive disorder, single episode, unspecified: Secondary | ICD-10-CM | POA: Diagnosis not present

## 2018-04-29 DIAGNOSIS — M549 Dorsalgia, unspecified: Secondary | ICD-10-CM | POA: Diagnosis not present

## 2018-04-29 DIAGNOSIS — E871 Hypo-osmolality and hyponatremia: Secondary | ICD-10-CM | POA: Diagnosis not present

## 2018-04-29 DIAGNOSIS — G894 Chronic pain syndrome: Secondary | ICD-10-CM | POA: Diagnosis not present

## 2018-04-29 DIAGNOSIS — I10 Essential (primary) hypertension: Secondary | ICD-10-CM | POA: Diagnosis not present

## 2018-04-29 DIAGNOSIS — M150 Primary generalized (osteo)arthritis: Secondary | ICD-10-CM | POA: Diagnosis not present

## 2018-04-29 DIAGNOSIS — K581 Irritable bowel syndrome with constipation: Secondary | ICD-10-CM | POA: Diagnosis not present

## 2018-04-29 DIAGNOSIS — F419 Anxiety disorder, unspecified: Secondary | ICD-10-CM | POA: Diagnosis not present

## 2018-04-29 DIAGNOSIS — J449 Chronic obstructive pulmonary disease, unspecified: Secondary | ICD-10-CM | POA: Diagnosis not present

## 2018-05-02 DIAGNOSIS — I10 Essential (primary) hypertension: Secondary | ICD-10-CM | POA: Diagnosis not present

## 2018-05-02 DIAGNOSIS — J449 Chronic obstructive pulmonary disease, unspecified: Secondary | ICD-10-CM | POA: Diagnosis not present

## 2018-05-02 DIAGNOSIS — F329 Major depressive disorder, single episode, unspecified: Secondary | ICD-10-CM | POA: Diagnosis not present

## 2018-05-02 DIAGNOSIS — M150 Primary generalized (osteo)arthritis: Secondary | ICD-10-CM | POA: Diagnosis not present

## 2018-05-02 DIAGNOSIS — E871 Hypo-osmolality and hyponatremia: Secondary | ICD-10-CM | POA: Diagnosis not present

## 2018-05-02 DIAGNOSIS — K581 Irritable bowel syndrome with constipation: Secondary | ICD-10-CM | POA: Diagnosis not present

## 2018-05-02 DIAGNOSIS — G894 Chronic pain syndrome: Secondary | ICD-10-CM | POA: Diagnosis not present

## 2018-05-02 DIAGNOSIS — F419 Anxiety disorder, unspecified: Secondary | ICD-10-CM | POA: Diagnosis not present

## 2018-05-02 DIAGNOSIS — M549 Dorsalgia, unspecified: Secondary | ICD-10-CM | POA: Diagnosis not present

## 2018-05-05 DIAGNOSIS — M150 Primary generalized (osteo)arthritis: Secondary | ICD-10-CM | POA: Diagnosis not present

## 2018-05-05 DIAGNOSIS — I10 Essential (primary) hypertension: Secondary | ICD-10-CM | POA: Diagnosis not present

## 2018-05-05 DIAGNOSIS — J449 Chronic obstructive pulmonary disease, unspecified: Secondary | ICD-10-CM | POA: Diagnosis not present

## 2018-05-05 DIAGNOSIS — G894 Chronic pain syndrome: Secondary | ICD-10-CM | POA: Diagnosis not present

## 2018-05-05 DIAGNOSIS — M549 Dorsalgia, unspecified: Secondary | ICD-10-CM | POA: Diagnosis not present

## 2018-05-05 DIAGNOSIS — F329 Major depressive disorder, single episode, unspecified: Secondary | ICD-10-CM | POA: Diagnosis not present

## 2018-05-05 DIAGNOSIS — E871 Hypo-osmolality and hyponatremia: Secondary | ICD-10-CM | POA: Diagnosis not present

## 2018-05-05 DIAGNOSIS — F419 Anxiety disorder, unspecified: Secondary | ICD-10-CM | POA: Diagnosis not present

## 2018-05-05 DIAGNOSIS — K581 Irritable bowel syndrome with constipation: Secondary | ICD-10-CM | POA: Diagnosis not present

## 2018-05-09 DIAGNOSIS — M7989 Other specified soft tissue disorders: Secondary | ICD-10-CM | POA: Diagnosis not present

## 2018-05-09 DIAGNOSIS — Z79899 Other long term (current) drug therapy: Secondary | ICD-10-CM | POA: Diagnosis not present

## 2018-05-09 DIAGNOSIS — M7661 Achilles tendinitis, right leg: Secondary | ICD-10-CM | POA: Diagnosis not present

## 2018-05-09 DIAGNOSIS — M5441 Lumbago with sciatica, right side: Secondary | ICD-10-CM | POA: Diagnosis not present

## 2018-05-09 DIAGNOSIS — K219 Gastro-esophageal reflux disease without esophagitis: Secondary | ICD-10-CM | POA: Diagnosis not present

## 2018-05-09 DIAGNOSIS — Z9181 History of falling: Secondary | ICD-10-CM | POA: Diagnosis not present

## 2018-05-09 DIAGNOSIS — M5442 Lumbago with sciatica, left side: Secondary | ICD-10-CM | POA: Diagnosis not present

## 2018-05-09 DIAGNOSIS — M25571 Pain in right ankle and joints of right foot: Secondary | ICD-10-CM | POA: Diagnosis not present

## 2018-05-09 DIAGNOSIS — J449 Chronic obstructive pulmonary disease, unspecified: Secondary | ICD-10-CM | POA: Diagnosis not present

## 2018-05-09 DIAGNOSIS — G8929 Other chronic pain: Secondary | ICD-10-CM | POA: Diagnosis not present

## 2018-05-09 DIAGNOSIS — Z5181 Encounter for therapeutic drug level monitoring: Secondary | ICD-10-CM | POA: Diagnosis not present

## 2018-05-10 DIAGNOSIS — R269 Unspecified abnormalities of gait and mobility: Secondary | ICD-10-CM | POA: Diagnosis not present

## 2018-05-10 DIAGNOSIS — I10 Essential (primary) hypertension: Secondary | ICD-10-CM | POA: Diagnosis not present

## 2018-05-10 DIAGNOSIS — G894 Chronic pain syndrome: Secondary | ICD-10-CM | POA: Diagnosis not present

## 2018-05-10 DIAGNOSIS — M549 Dorsalgia, unspecified: Secondary | ICD-10-CM | POA: Diagnosis not present

## 2018-05-10 DIAGNOSIS — K581 Irritable bowel syndrome with constipation: Secondary | ICD-10-CM | POA: Diagnosis not present

## 2018-05-10 DIAGNOSIS — E871 Hypo-osmolality and hyponatremia: Secondary | ICD-10-CM | POA: Diagnosis not present

## 2018-05-10 DIAGNOSIS — F329 Major depressive disorder, single episode, unspecified: Secondary | ICD-10-CM | POA: Diagnosis not present

## 2018-05-10 DIAGNOSIS — M150 Primary generalized (osteo)arthritis: Secondary | ICD-10-CM | POA: Diagnosis not present

## 2018-05-10 DIAGNOSIS — J449 Chronic obstructive pulmonary disease, unspecified: Secondary | ICD-10-CM | POA: Diagnosis not present

## 2018-05-10 DIAGNOSIS — F419 Anxiety disorder, unspecified: Secondary | ICD-10-CM | POA: Diagnosis not present

## 2018-05-12 DIAGNOSIS — J449 Chronic obstructive pulmonary disease, unspecified: Secondary | ICD-10-CM | POA: Diagnosis not present

## 2018-05-12 DIAGNOSIS — E871 Hypo-osmolality and hyponatremia: Secondary | ICD-10-CM | POA: Diagnosis not present

## 2018-05-12 DIAGNOSIS — K581 Irritable bowel syndrome with constipation: Secondary | ICD-10-CM | POA: Diagnosis not present

## 2018-05-12 DIAGNOSIS — I10 Essential (primary) hypertension: Secondary | ICD-10-CM | POA: Diagnosis not present

## 2018-05-12 DIAGNOSIS — M150 Primary generalized (osteo)arthritis: Secondary | ICD-10-CM | POA: Diagnosis not present

## 2018-05-12 DIAGNOSIS — F419 Anxiety disorder, unspecified: Secondary | ICD-10-CM | POA: Diagnosis not present

## 2018-05-12 DIAGNOSIS — F329 Major depressive disorder, single episode, unspecified: Secondary | ICD-10-CM | POA: Diagnosis not present

## 2018-05-12 DIAGNOSIS — G894 Chronic pain syndrome: Secondary | ICD-10-CM | POA: Diagnosis not present

## 2018-05-12 DIAGNOSIS — M549 Dorsalgia, unspecified: Secondary | ICD-10-CM | POA: Diagnosis not present

## 2018-05-16 DIAGNOSIS — G894 Chronic pain syndrome: Secondary | ICD-10-CM | POA: Diagnosis not present

## 2018-05-16 DIAGNOSIS — M549 Dorsalgia, unspecified: Secondary | ICD-10-CM | POA: Diagnosis not present

## 2018-05-16 DIAGNOSIS — F329 Major depressive disorder, single episode, unspecified: Secondary | ICD-10-CM | POA: Diagnosis not present

## 2018-05-16 DIAGNOSIS — K581 Irritable bowel syndrome with constipation: Secondary | ICD-10-CM | POA: Diagnosis not present

## 2018-05-16 DIAGNOSIS — E871 Hypo-osmolality and hyponatremia: Secondary | ICD-10-CM | POA: Diagnosis not present

## 2018-05-16 DIAGNOSIS — F419 Anxiety disorder, unspecified: Secondary | ICD-10-CM | POA: Diagnosis not present

## 2018-05-16 DIAGNOSIS — I10 Essential (primary) hypertension: Secondary | ICD-10-CM | POA: Diagnosis not present

## 2018-05-16 DIAGNOSIS — J449 Chronic obstructive pulmonary disease, unspecified: Secondary | ICD-10-CM | POA: Diagnosis not present

## 2018-05-16 DIAGNOSIS — M150 Primary generalized (osteo)arthritis: Secondary | ICD-10-CM | POA: Diagnosis not present

## 2018-05-19 DIAGNOSIS — E871 Hypo-osmolality and hyponatremia: Secondary | ICD-10-CM | POA: Diagnosis not present

## 2018-05-19 DIAGNOSIS — K581 Irritable bowel syndrome with constipation: Secondary | ICD-10-CM | POA: Diagnosis not present

## 2018-05-19 DIAGNOSIS — F419 Anxiety disorder, unspecified: Secondary | ICD-10-CM | POA: Diagnosis not present

## 2018-05-19 DIAGNOSIS — M549 Dorsalgia, unspecified: Secondary | ICD-10-CM | POA: Diagnosis not present

## 2018-05-19 DIAGNOSIS — M150 Primary generalized (osteo)arthritis: Secondary | ICD-10-CM | POA: Diagnosis not present

## 2018-05-19 DIAGNOSIS — F329 Major depressive disorder, single episode, unspecified: Secondary | ICD-10-CM | POA: Diagnosis not present

## 2018-05-19 DIAGNOSIS — G894 Chronic pain syndrome: Secondary | ICD-10-CM | POA: Diagnosis not present

## 2018-05-19 DIAGNOSIS — J449 Chronic obstructive pulmonary disease, unspecified: Secondary | ICD-10-CM | POA: Diagnosis not present

## 2018-05-19 DIAGNOSIS — I10 Essential (primary) hypertension: Secondary | ICD-10-CM | POA: Diagnosis not present

## 2018-05-23 DIAGNOSIS — E871 Hypo-osmolality and hyponatremia: Secondary | ICD-10-CM | POA: Diagnosis not present

## 2018-05-23 DIAGNOSIS — J449 Chronic obstructive pulmonary disease, unspecified: Secondary | ICD-10-CM | POA: Diagnosis not present

## 2018-05-23 DIAGNOSIS — M150 Primary generalized (osteo)arthritis: Secondary | ICD-10-CM | POA: Diagnosis not present

## 2018-05-23 DIAGNOSIS — D500 Iron deficiency anemia secondary to blood loss (chronic): Secondary | ICD-10-CM | POA: Diagnosis not present

## 2018-05-23 DIAGNOSIS — I10 Essential (primary) hypertension: Secondary | ICD-10-CM | POA: Diagnosis not present

## 2018-05-23 DIAGNOSIS — G894 Chronic pain syndrome: Secondary | ICD-10-CM | POA: Diagnosis not present

## 2018-05-23 DIAGNOSIS — F419 Anxiety disorder, unspecified: Secondary | ICD-10-CM | POA: Diagnosis not present

## 2018-05-23 DIAGNOSIS — M549 Dorsalgia, unspecified: Secondary | ICD-10-CM | POA: Diagnosis not present

## 2018-05-23 DIAGNOSIS — F329 Major depressive disorder, single episode, unspecified: Secondary | ICD-10-CM | POA: Diagnosis not present

## 2018-05-23 DIAGNOSIS — K581 Irritable bowel syndrome with constipation: Secondary | ICD-10-CM | POA: Diagnosis not present

## 2018-05-26 DIAGNOSIS — K581 Irritable bowel syndrome with constipation: Secondary | ICD-10-CM | POA: Diagnosis not present

## 2018-05-26 DIAGNOSIS — K5904 Chronic idiopathic constipation: Secondary | ICD-10-CM | POA: Diagnosis not present

## 2018-05-26 DIAGNOSIS — G894 Chronic pain syndrome: Secondary | ICD-10-CM | POA: Diagnosis not present

## 2018-05-26 DIAGNOSIS — F419 Anxiety disorder, unspecified: Secondary | ICD-10-CM | POA: Diagnosis not present

## 2018-05-26 DIAGNOSIS — I10 Essential (primary) hypertension: Secondary | ICD-10-CM | POA: Diagnosis not present

## 2018-05-26 DIAGNOSIS — M150 Primary generalized (osteo)arthritis: Secondary | ICD-10-CM | POA: Diagnosis not present

## 2018-05-26 DIAGNOSIS — J449 Chronic obstructive pulmonary disease, unspecified: Secondary | ICD-10-CM | POA: Diagnosis not present

## 2018-05-26 DIAGNOSIS — E871 Hypo-osmolality and hyponatremia: Secondary | ICD-10-CM | POA: Diagnosis not present

## 2018-05-26 DIAGNOSIS — F329 Major depressive disorder, single episode, unspecified: Secondary | ICD-10-CM | POA: Diagnosis not present

## 2018-05-26 DIAGNOSIS — D500 Iron deficiency anemia secondary to blood loss (chronic): Secondary | ICD-10-CM | POA: Diagnosis not present

## 2018-05-26 DIAGNOSIS — M549 Dorsalgia, unspecified: Secondary | ICD-10-CM | POA: Diagnosis not present

## 2018-05-31 DIAGNOSIS — M150 Primary generalized (osteo)arthritis: Secondary | ICD-10-CM | POA: Diagnosis not present

## 2018-05-31 DIAGNOSIS — J301 Allergic rhinitis due to pollen: Secondary | ICD-10-CM | POA: Diagnosis not present

## 2018-05-31 DIAGNOSIS — G894 Chronic pain syndrome: Secondary | ICD-10-CM | POA: Diagnosis not present

## 2018-05-31 DIAGNOSIS — E871 Hypo-osmolality and hyponatremia: Secondary | ICD-10-CM | POA: Diagnosis not present

## 2018-05-31 DIAGNOSIS — M549 Dorsalgia, unspecified: Secondary | ICD-10-CM | POA: Diagnosis not present

## 2018-05-31 DIAGNOSIS — K581 Irritable bowel syndrome with constipation: Secondary | ICD-10-CM | POA: Diagnosis not present

## 2018-05-31 DIAGNOSIS — I10 Essential (primary) hypertension: Secondary | ICD-10-CM | POA: Diagnosis not present

## 2018-05-31 DIAGNOSIS — F329 Major depressive disorder, single episode, unspecified: Secondary | ICD-10-CM | POA: Diagnosis not present

## 2018-05-31 DIAGNOSIS — G2581 Restless legs syndrome: Secondary | ICD-10-CM | POA: Diagnosis not present

## 2018-05-31 DIAGNOSIS — J449 Chronic obstructive pulmonary disease, unspecified: Secondary | ICD-10-CM | POA: Diagnosis not present

## 2018-05-31 DIAGNOSIS — R5383 Other fatigue: Secondary | ICD-10-CM | POA: Diagnosis not present

## 2018-05-31 DIAGNOSIS — J4540 Moderate persistent asthma, uncomplicated: Secondary | ICD-10-CM | POA: Diagnosis not present

## 2018-05-31 DIAGNOSIS — E559 Vitamin D deficiency, unspecified: Secondary | ICD-10-CM | POA: Diagnosis not present

## 2018-05-31 DIAGNOSIS — G4733 Obstructive sleep apnea (adult) (pediatric): Secondary | ICD-10-CM | POA: Diagnosis not present

## 2018-05-31 DIAGNOSIS — F419 Anxiety disorder, unspecified: Secondary | ICD-10-CM | POA: Diagnosis not present

## 2018-06-02 DIAGNOSIS — F419 Anxiety disorder, unspecified: Secondary | ICD-10-CM | POA: Diagnosis not present

## 2018-06-02 DIAGNOSIS — I10 Essential (primary) hypertension: Secondary | ICD-10-CM | POA: Diagnosis not present

## 2018-06-02 DIAGNOSIS — J449 Chronic obstructive pulmonary disease, unspecified: Secondary | ICD-10-CM | POA: Diagnosis not present

## 2018-06-02 DIAGNOSIS — M150 Primary generalized (osteo)arthritis: Secondary | ICD-10-CM | POA: Diagnosis not present

## 2018-06-02 DIAGNOSIS — E871 Hypo-osmolality and hyponatremia: Secondary | ICD-10-CM | POA: Diagnosis not present

## 2018-06-02 DIAGNOSIS — F329 Major depressive disorder, single episode, unspecified: Secondary | ICD-10-CM | POA: Diagnosis not present

## 2018-06-02 DIAGNOSIS — M549 Dorsalgia, unspecified: Secondary | ICD-10-CM | POA: Diagnosis not present

## 2018-06-02 DIAGNOSIS — K581 Irritable bowel syndrome with constipation: Secondary | ICD-10-CM | POA: Diagnosis not present

## 2018-06-02 DIAGNOSIS — G894 Chronic pain syndrome: Secondary | ICD-10-CM | POA: Diagnosis not present

## 2018-06-10 DIAGNOSIS — G4733 Obstructive sleep apnea (adult) (pediatric): Secondary | ICD-10-CM | POA: Diagnosis not present

## 2018-06-10 DIAGNOSIS — R339 Retention of urine, unspecified: Secondary | ICD-10-CM | POA: Diagnosis not present

## 2018-06-10 DIAGNOSIS — J449 Chronic obstructive pulmonary disease, unspecified: Secondary | ICD-10-CM | POA: Diagnosis not present

## 2018-06-10 DIAGNOSIS — Z23 Encounter for immunization: Secondary | ICD-10-CM | POA: Diagnosis not present

## 2018-06-10 DIAGNOSIS — F411 Generalized anxiety disorder: Secondary | ICD-10-CM | POA: Diagnosis not present

## 2018-06-10 DIAGNOSIS — Z79899 Other long term (current) drug therapy: Secondary | ICD-10-CM | POA: Diagnosis not present

## 2018-06-10 DIAGNOSIS — R269 Unspecified abnormalities of gait and mobility: Secondary | ICD-10-CM | POA: Diagnosis not present

## 2018-06-10 DIAGNOSIS — Z5181 Encounter for therapeutic drug level monitoring: Secondary | ICD-10-CM | POA: Diagnosis not present

## 2018-06-10 DIAGNOSIS — G894 Chronic pain syndrome: Secondary | ICD-10-CM | POA: Diagnosis not present

## 2018-06-19 DIAGNOSIS — G4733 Obstructive sleep apnea (adult) (pediatric): Secondary | ICD-10-CM | POA: Diagnosis not present

## 2018-06-27 DIAGNOSIS — R05 Cough: Secondary | ICD-10-CM | POA: Diagnosis not present

## 2018-06-27 DIAGNOSIS — E559 Vitamin D deficiency, unspecified: Secondary | ICD-10-CM | POA: Diagnosis not present

## 2018-06-27 DIAGNOSIS — J301 Allergic rhinitis due to pollen: Secondary | ICD-10-CM | POA: Diagnosis not present

## 2018-06-27 DIAGNOSIS — G2581 Restless legs syndrome: Secondary | ICD-10-CM | POA: Diagnosis not present

## 2018-06-27 DIAGNOSIS — R0600 Dyspnea, unspecified: Secondary | ICD-10-CM | POA: Diagnosis not present

## 2018-06-27 DIAGNOSIS — G4733 Obstructive sleep apnea (adult) (pediatric): Secondary | ICD-10-CM | POA: Diagnosis not present

## 2018-06-27 DIAGNOSIS — J4540 Moderate persistent asthma, uncomplicated: Secondary | ICD-10-CM | POA: Diagnosis not present

## 2018-06-27 DIAGNOSIS — R5383 Other fatigue: Secondary | ICD-10-CM | POA: Diagnosis not present

## 2018-07-10 DIAGNOSIS — R269 Unspecified abnormalities of gait and mobility: Secondary | ICD-10-CM | POA: Diagnosis not present

## 2018-07-10 DIAGNOSIS — J449 Chronic obstructive pulmonary disease, unspecified: Secondary | ICD-10-CM | POA: Diagnosis not present

## 2018-07-11 DIAGNOSIS — D649 Anemia, unspecified: Secondary | ICD-10-CM | POA: Diagnosis not present

## 2018-07-11 DIAGNOSIS — I10 Essential (primary) hypertension: Secondary | ICD-10-CM | POA: Diagnosis not present

## 2018-07-11 DIAGNOSIS — G629 Polyneuropathy, unspecified: Secondary | ICD-10-CM | POA: Diagnosis not present

## 2018-07-11 DIAGNOSIS — E785 Hyperlipidemia, unspecified: Secondary | ICD-10-CM | POA: Diagnosis not present

## 2018-07-11 DIAGNOSIS — E871 Hypo-osmolality and hyponatremia: Secondary | ICD-10-CM | POA: Diagnosis not present

## 2018-07-11 DIAGNOSIS — J45909 Unspecified asthma, uncomplicated: Secondary | ICD-10-CM | POA: Diagnosis not present

## 2018-07-11 DIAGNOSIS — Z1389 Encounter for screening for other disorder: Secondary | ICD-10-CM | POA: Diagnosis not present

## 2018-07-11 DIAGNOSIS — Z79899 Other long term (current) drug therapy: Secondary | ICD-10-CM | POA: Diagnosis not present

## 2018-07-11 DIAGNOSIS — Z5181 Encounter for therapeutic drug level monitoring: Secondary | ICD-10-CM | POA: Diagnosis not present

## 2018-07-11 DIAGNOSIS — D509 Iron deficiency anemia, unspecified: Secondary | ICD-10-CM | POA: Diagnosis not present

## 2018-07-11 DIAGNOSIS — Z0000 Encounter for general adult medical examination without abnormal findings: Secondary | ICD-10-CM | POA: Diagnosis not present

## 2018-07-13 DIAGNOSIS — G4733 Obstructive sleep apnea (adult) (pediatric): Secondary | ICD-10-CM | POA: Diagnosis not present

## 2018-07-18 DIAGNOSIS — J4540 Moderate persistent asthma, uncomplicated: Secondary | ICD-10-CM | POA: Diagnosis not present

## 2018-07-18 DIAGNOSIS — Z1231 Encounter for screening mammogram for malignant neoplasm of breast: Secondary | ICD-10-CM | POA: Diagnosis not present

## 2018-07-18 DIAGNOSIS — G2581 Restless legs syndrome: Secondary | ICD-10-CM | POA: Diagnosis not present

## 2018-07-18 DIAGNOSIS — R5383 Other fatigue: Secondary | ICD-10-CM | POA: Diagnosis not present

## 2018-07-18 DIAGNOSIS — G4761 Periodic limb movement disorder: Secondary | ICD-10-CM | POA: Diagnosis not present

## 2018-07-18 DIAGNOSIS — R05 Cough: Secondary | ICD-10-CM | POA: Diagnosis not present

## 2018-07-18 DIAGNOSIS — E559 Vitamin D deficiency, unspecified: Secondary | ICD-10-CM | POA: Diagnosis not present

## 2018-07-18 DIAGNOSIS — R0600 Dyspnea, unspecified: Secondary | ICD-10-CM | POA: Diagnosis not present

## 2018-07-18 DIAGNOSIS — J301 Allergic rhinitis due to pollen: Secondary | ICD-10-CM | POA: Diagnosis not present

## 2018-07-21 DIAGNOSIS — R0600 Dyspnea, unspecified: Secondary | ICD-10-CM

## 2018-08-08 DIAGNOSIS — G4733 Obstructive sleep apnea (adult) (pediatric): Secondary | ICD-10-CM | POA: Diagnosis not present

## 2018-08-10 DIAGNOSIS — J449 Chronic obstructive pulmonary disease, unspecified: Secondary | ICD-10-CM | POA: Diagnosis not present

## 2018-08-10 DIAGNOSIS — R269 Unspecified abnormalities of gait and mobility: Secondary | ICD-10-CM | POA: Diagnosis not present

## 2018-08-11 DIAGNOSIS — R5383 Other fatigue: Secondary | ICD-10-CM | POA: Diagnosis not present

## 2018-08-11 DIAGNOSIS — J4540 Moderate persistent asthma, uncomplicated: Secondary | ICD-10-CM | POA: Diagnosis not present

## 2018-08-11 DIAGNOSIS — G2581 Restless legs syndrome: Secondary | ICD-10-CM | POA: Diagnosis not present

## 2018-08-11 DIAGNOSIS — G4761 Periodic limb movement disorder: Secondary | ICD-10-CM | POA: Diagnosis not present

## 2018-08-11 DIAGNOSIS — G4733 Obstructive sleep apnea (adult) (pediatric): Secondary | ICD-10-CM | POA: Diagnosis not present

## 2018-08-11 DIAGNOSIS — J301 Allergic rhinitis due to pollen: Secondary | ICD-10-CM | POA: Diagnosis not present

## 2018-08-12 DIAGNOSIS — Z79899 Other long term (current) drug therapy: Secondary | ICD-10-CM | POA: Diagnosis not present

## 2018-08-12 DIAGNOSIS — E871 Hypo-osmolality and hyponatremia: Secondary | ICD-10-CM | POA: Diagnosis not present

## 2018-08-12 DIAGNOSIS — Z5181 Encounter for therapeutic drug level monitoring: Secondary | ICD-10-CM | POA: Diagnosis not present

## 2018-09-08 DIAGNOSIS — G4733 Obstructive sleep apnea (adult) (pediatric): Secondary | ICD-10-CM | POA: Diagnosis not present

## 2018-09-10 DIAGNOSIS — J449 Chronic obstructive pulmonary disease, unspecified: Secondary | ICD-10-CM | POA: Diagnosis not present

## 2018-09-10 DIAGNOSIS — R269 Unspecified abnormalities of gait and mobility: Secondary | ICD-10-CM | POA: Diagnosis not present

## 2018-09-19 DIAGNOSIS — R5383 Other fatigue: Secondary | ICD-10-CM | POA: Diagnosis not present

## 2018-09-19 DIAGNOSIS — R6889 Other general symptoms and signs: Secondary | ICD-10-CM | POA: Diagnosis not present

## 2018-09-19 DIAGNOSIS — J301 Allergic rhinitis due to pollen: Secondary | ICD-10-CM | POA: Diagnosis not present

## 2018-09-19 DIAGNOSIS — J4540 Moderate persistent asthma, uncomplicated: Secondary | ICD-10-CM | POA: Diagnosis not present

## 2018-09-19 DIAGNOSIS — G4733 Obstructive sleep apnea (adult) (pediatric): Secondary | ICD-10-CM | POA: Diagnosis not present

## 2018-09-19 DIAGNOSIS — E559 Vitamin D deficiency, unspecified: Secondary | ICD-10-CM | POA: Diagnosis not present

## 2018-09-19 DIAGNOSIS — G2581 Restless legs syndrome: Secondary | ICD-10-CM | POA: Diagnosis not present

## 2018-09-23 DIAGNOSIS — G894 Chronic pain syndrome: Secondary | ICD-10-CM | POA: Diagnosis not present

## 2018-09-23 DIAGNOSIS — Z5181 Encounter for therapeutic drug level monitoring: Secondary | ICD-10-CM | POA: Diagnosis not present

## 2018-09-23 DIAGNOSIS — E871 Hypo-osmolality and hyponatremia: Secondary | ICD-10-CM | POA: Diagnosis not present

## 2018-09-23 DIAGNOSIS — F411 Generalized anxiety disorder: Secondary | ICD-10-CM | POA: Diagnosis not present

## 2018-09-23 DIAGNOSIS — R6889 Other general symptoms and signs: Secondary | ICD-10-CM | POA: Diagnosis not present

## 2018-09-23 DIAGNOSIS — Z79899 Other long term (current) drug therapy: Secondary | ICD-10-CM | POA: Diagnosis not present

## 2018-10-07 DIAGNOSIS — G4733 Obstructive sleep apnea (adult) (pediatric): Secondary | ICD-10-CM | POA: Diagnosis not present

## 2018-10-09 DIAGNOSIS — J449 Chronic obstructive pulmonary disease, unspecified: Secondary | ICD-10-CM | POA: Diagnosis not present

## 2018-10-09 DIAGNOSIS — R269 Unspecified abnormalities of gait and mobility: Secondary | ICD-10-CM | POA: Diagnosis not present

## 2018-10-24 DIAGNOSIS — Z5181 Encounter for therapeutic drug level monitoring: Secondary | ICD-10-CM | POA: Diagnosis not present

## 2018-10-24 DIAGNOSIS — M549 Dorsalgia, unspecified: Secondary | ICD-10-CM | POA: Diagnosis not present

## 2018-10-24 DIAGNOSIS — J45909 Unspecified asthma, uncomplicated: Secondary | ICD-10-CM | POA: Diagnosis not present

## 2018-10-24 DIAGNOSIS — R6889 Other general symptoms and signs: Secondary | ICD-10-CM | POA: Diagnosis not present

## 2018-10-24 DIAGNOSIS — Z79899 Other long term (current) drug therapy: Secondary | ICD-10-CM | POA: Diagnosis not present

## 2018-10-24 DIAGNOSIS — J309 Allergic rhinitis, unspecified: Secondary | ICD-10-CM | POA: Diagnosis not present

## 2018-10-24 DIAGNOSIS — G8929 Other chronic pain: Secondary | ICD-10-CM | POA: Diagnosis not present

## 2018-11-07 DIAGNOSIS — G4733 Obstructive sleep apnea (adult) (pediatric): Secondary | ICD-10-CM | POA: Diagnosis not present

## 2018-11-09 DIAGNOSIS — R269 Unspecified abnormalities of gait and mobility: Secondary | ICD-10-CM | POA: Diagnosis not present

## 2018-11-09 DIAGNOSIS — J449 Chronic obstructive pulmonary disease, unspecified: Secondary | ICD-10-CM | POA: Diagnosis not present

## 2018-11-25 DIAGNOSIS — G4733 Obstructive sleep apnea (adult) (pediatric): Secondary | ICD-10-CM | POA: Diagnosis not present

## 2018-12-07 DIAGNOSIS — G4733 Obstructive sleep apnea (adult) (pediatric): Secondary | ICD-10-CM | POA: Diagnosis not present

## 2018-12-09 DIAGNOSIS — J449 Chronic obstructive pulmonary disease, unspecified: Secondary | ICD-10-CM | POA: Diagnosis not present

## 2018-12-09 DIAGNOSIS — R269 Unspecified abnormalities of gait and mobility: Secondary | ICD-10-CM | POA: Diagnosis not present

## 2018-12-12 DIAGNOSIS — R0600 Dyspnea, unspecified: Secondary | ICD-10-CM | POA: Diagnosis not present

## 2018-12-12 DIAGNOSIS — J301 Allergic rhinitis due to pollen: Secondary | ICD-10-CM | POA: Diagnosis not present

## 2018-12-12 DIAGNOSIS — R5383 Other fatigue: Secondary | ICD-10-CM | POA: Diagnosis not present

## 2018-12-12 DIAGNOSIS — R05 Cough: Secondary | ICD-10-CM | POA: Diagnosis not present

## 2018-12-12 DIAGNOSIS — J4541 Moderate persistent asthma with (acute) exacerbation: Secondary | ICD-10-CM | POA: Diagnosis not present

## 2018-12-12 DIAGNOSIS — E559 Vitamin D deficiency, unspecified: Secondary | ICD-10-CM | POA: Diagnosis not present

## 2018-12-12 DIAGNOSIS — G4733 Obstructive sleep apnea (adult) (pediatric): Secondary | ICD-10-CM | POA: Diagnosis not present

## 2018-12-20 DIAGNOSIS — G4733 Obstructive sleep apnea (adult) (pediatric): Secondary | ICD-10-CM | POA: Diagnosis not present

## 2018-12-20 DIAGNOSIS — J4541 Moderate persistent asthma with (acute) exacerbation: Secondary | ICD-10-CM | POA: Diagnosis not present

## 2018-12-20 DIAGNOSIS — G2581 Restless legs syndrome: Secondary | ICD-10-CM | POA: Diagnosis not present

## 2018-12-20 DIAGNOSIS — R05 Cough: Secondary | ICD-10-CM | POA: Diagnosis not present

## 2018-12-20 DIAGNOSIS — Z6841 Body Mass Index (BMI) 40.0 and over, adult: Secondary | ICD-10-CM | POA: Diagnosis not present

## 2018-12-20 DIAGNOSIS — R0600 Dyspnea, unspecified: Secondary | ICD-10-CM | POA: Diagnosis not present

## 2018-12-20 DIAGNOSIS — Z03818 Encounter for observation for suspected exposure to other biological agents ruled out: Secondary | ICD-10-CM | POA: Diagnosis not present

## 2018-12-20 DIAGNOSIS — R5383 Other fatigue: Secondary | ICD-10-CM | POA: Diagnosis not present

## 2018-12-20 DIAGNOSIS — J301 Allergic rhinitis due to pollen: Secondary | ICD-10-CM | POA: Diagnosis not present

## 2018-12-20 DIAGNOSIS — E559 Vitamin D deficiency, unspecified: Secondary | ICD-10-CM | POA: Diagnosis not present

## 2018-12-23 DIAGNOSIS — J301 Allergic rhinitis due to pollen: Secondary | ICD-10-CM | POA: Diagnosis not present

## 2018-12-23 DIAGNOSIS — R05 Cough: Secondary | ICD-10-CM | POA: Diagnosis not present

## 2018-12-23 DIAGNOSIS — R0600 Dyspnea, unspecified: Secondary | ICD-10-CM | POA: Diagnosis not present

## 2018-12-23 DIAGNOSIS — G4733 Obstructive sleep apnea (adult) (pediatric): Secondary | ICD-10-CM | POA: Diagnosis not present

## 2018-12-23 DIAGNOSIS — E559 Vitamin D deficiency, unspecified: Secondary | ICD-10-CM | POA: Diagnosis not present

## 2018-12-23 DIAGNOSIS — J4541 Moderate persistent asthma with (acute) exacerbation: Secondary | ICD-10-CM | POA: Diagnosis not present

## 2018-12-23 DIAGNOSIS — G2581 Restless legs syndrome: Secondary | ICD-10-CM | POA: Diagnosis not present

## 2018-12-30 DIAGNOSIS — J301 Allergic rhinitis due to pollen: Secondary | ICD-10-CM | POA: Diagnosis not present

## 2018-12-30 DIAGNOSIS — E559 Vitamin D deficiency, unspecified: Secondary | ICD-10-CM | POA: Diagnosis not present

## 2018-12-30 DIAGNOSIS — G4733 Obstructive sleep apnea (adult) (pediatric): Secondary | ICD-10-CM | POA: Diagnosis not present

## 2018-12-30 DIAGNOSIS — G2581 Restless legs syndrome: Secondary | ICD-10-CM | POA: Diagnosis not present

## 2018-12-30 DIAGNOSIS — J4540 Moderate persistent asthma, uncomplicated: Secondary | ICD-10-CM | POA: Diagnosis not present

## 2018-12-30 DIAGNOSIS — R05 Cough: Secondary | ICD-10-CM | POA: Diagnosis not present

## 2018-12-30 DIAGNOSIS — R5383 Other fatigue: Secondary | ICD-10-CM | POA: Diagnosis not present

## 2019-01-03 DIAGNOSIS — F411 Generalized anxiety disorder: Secondary | ICD-10-CM | POA: Diagnosis not present

## 2019-01-03 DIAGNOSIS — J189 Pneumonia, unspecified organism: Secondary | ICD-10-CM | POA: Diagnosis not present

## 2019-01-03 DIAGNOSIS — Z5181 Encounter for therapeutic drug level monitoring: Secondary | ICD-10-CM | POA: Diagnosis not present

## 2019-01-03 DIAGNOSIS — Z79899 Other long term (current) drug therapy: Secondary | ICD-10-CM | POA: Diagnosis not present

## 2019-01-03 DIAGNOSIS — M25561 Pain in right knee: Secondary | ICD-10-CM | POA: Diagnosis not present

## 2019-01-03 DIAGNOSIS — D500 Iron deficiency anemia secondary to blood loss (chronic): Secondary | ICD-10-CM | POA: Diagnosis not present

## 2019-01-03 DIAGNOSIS — R609 Edema, unspecified: Secondary | ICD-10-CM | POA: Diagnosis not present

## 2019-01-03 DIAGNOSIS — I10 Essential (primary) hypertension: Secondary | ICD-10-CM | POA: Diagnosis not present

## 2019-01-03 DIAGNOSIS — Z6841 Body Mass Index (BMI) 40.0 and over, adult: Secondary | ICD-10-CM | POA: Diagnosis not present

## 2019-01-03 DIAGNOSIS — G894 Chronic pain syndrome: Secondary | ICD-10-CM | POA: Diagnosis not present

## 2019-01-06 DIAGNOSIS — E559 Vitamin D deficiency, unspecified: Secondary | ICD-10-CM | POA: Diagnosis not present

## 2019-01-06 DIAGNOSIS — J4540 Moderate persistent asthma, uncomplicated: Secondary | ICD-10-CM | POA: Diagnosis not present

## 2019-01-06 DIAGNOSIS — R05 Cough: Secondary | ICD-10-CM | POA: Diagnosis not present

## 2019-01-06 DIAGNOSIS — G2581 Restless legs syndrome: Secondary | ICD-10-CM | POA: Diagnosis not present

## 2019-01-06 DIAGNOSIS — J301 Allergic rhinitis due to pollen: Secondary | ICD-10-CM | POA: Diagnosis not present

## 2019-01-06 DIAGNOSIS — R5383 Other fatigue: Secondary | ICD-10-CM | POA: Diagnosis not present

## 2019-01-06 DIAGNOSIS — G4733 Obstructive sleep apnea (adult) (pediatric): Secondary | ICD-10-CM | POA: Diagnosis not present

## 2019-01-07 DIAGNOSIS — G4733 Obstructive sleep apnea (adult) (pediatric): Secondary | ICD-10-CM | POA: Diagnosis not present

## 2019-01-09 DIAGNOSIS — J449 Chronic obstructive pulmonary disease, unspecified: Secondary | ICD-10-CM | POA: Diagnosis not present

## 2019-01-09 DIAGNOSIS — R269 Unspecified abnormalities of gait and mobility: Secondary | ICD-10-CM | POA: Diagnosis not present

## 2019-01-11 DIAGNOSIS — R0600 Dyspnea, unspecified: Secondary | ICD-10-CM | POA: Diagnosis not present

## 2019-01-11 DIAGNOSIS — I361 Nonrheumatic tricuspid (valve) insufficiency: Secondary | ICD-10-CM | POA: Diagnosis not present

## 2019-01-11 DIAGNOSIS — I071 Rheumatic tricuspid insufficiency: Secondary | ICD-10-CM | POA: Diagnosis not present

## 2019-01-13 DIAGNOSIS — J4540 Moderate persistent asthma, uncomplicated: Secondary | ICD-10-CM | POA: Diagnosis not present

## 2019-01-13 DIAGNOSIS — G4733 Obstructive sleep apnea (adult) (pediatric): Secondary | ICD-10-CM | POA: Diagnosis not present

## 2019-01-13 DIAGNOSIS — G2581 Restless legs syndrome: Secondary | ICD-10-CM | POA: Diagnosis not present

## 2019-01-13 DIAGNOSIS — F411 Generalized anxiety disorder: Secondary | ICD-10-CM | POA: Diagnosis not present

## 2019-01-13 DIAGNOSIS — R05 Cough: Secondary | ICD-10-CM | POA: Diagnosis not present

## 2019-01-13 DIAGNOSIS — R5383 Other fatigue: Secondary | ICD-10-CM | POA: Diagnosis not present

## 2019-01-13 DIAGNOSIS — E559 Vitamin D deficiency, unspecified: Secondary | ICD-10-CM | POA: Diagnosis not present

## 2019-01-13 DIAGNOSIS — J301 Allergic rhinitis due to pollen: Secondary | ICD-10-CM | POA: Diagnosis not present

## 2019-01-31 DIAGNOSIS — F419 Anxiety disorder, unspecified: Secondary | ICD-10-CM | POA: Diagnosis not present

## 2019-01-31 DIAGNOSIS — R531 Weakness: Secondary | ICD-10-CM | POA: Diagnosis not present

## 2019-01-31 DIAGNOSIS — Z7409 Other reduced mobility: Secondary | ICD-10-CM | POA: Diagnosis not present

## 2019-01-31 DIAGNOSIS — Z79899 Other long term (current) drug therapy: Secondary | ICD-10-CM | POA: Diagnosis not present

## 2019-01-31 DIAGNOSIS — Z5181 Encounter for therapeutic drug level monitoring: Secondary | ICD-10-CM | POA: Diagnosis not present

## 2019-01-31 DIAGNOSIS — G894 Chronic pain syndrome: Secondary | ICD-10-CM | POA: Diagnosis not present

## 2019-02-06 DIAGNOSIS — G4733 Obstructive sleep apnea (adult) (pediatric): Secondary | ICD-10-CM | POA: Diagnosis not present

## 2019-02-08 DIAGNOSIS — I10 Essential (primary) hypertension: Secondary | ICD-10-CM | POA: Diagnosis not present

## 2019-02-08 DIAGNOSIS — E871 Hypo-osmolality and hyponatremia: Secondary | ICD-10-CM | POA: Diagnosis not present

## 2019-02-08 DIAGNOSIS — J449 Chronic obstructive pulmonary disease, unspecified: Secondary | ICD-10-CM | POA: Diagnosis not present

## 2019-02-08 DIAGNOSIS — M48061 Spinal stenosis, lumbar region without neurogenic claudication: Secondary | ICD-10-CM | POA: Diagnosis not present

## 2019-02-08 DIAGNOSIS — M4804 Spinal stenosis, thoracic region: Secondary | ICD-10-CM | POA: Diagnosis not present

## 2019-02-08 DIAGNOSIS — G894 Chronic pain syndrome: Secondary | ICD-10-CM | POA: Diagnosis not present

## 2019-02-08 DIAGNOSIS — M47812 Spondylosis without myelopathy or radiculopathy, cervical region: Secondary | ICD-10-CM | POA: Diagnosis not present

## 2019-02-08 DIAGNOSIS — M4807 Spinal stenosis, lumbosacral region: Secondary | ICD-10-CM | POA: Diagnosis not present

## 2019-02-08 DIAGNOSIS — R269 Unspecified abnormalities of gait and mobility: Secondary | ICD-10-CM | POA: Diagnosis not present

## 2019-02-08 DIAGNOSIS — F329 Major depressive disorder, single episode, unspecified: Secondary | ICD-10-CM | POA: Diagnosis not present

## 2019-02-13 DIAGNOSIS — E559 Vitamin D deficiency, unspecified: Secondary | ICD-10-CM | POA: Diagnosis not present

## 2019-02-13 DIAGNOSIS — G4733 Obstructive sleep apnea (adult) (pediatric): Secondary | ICD-10-CM | POA: Diagnosis not present

## 2019-02-13 DIAGNOSIS — F411 Generalized anxiety disorder: Secondary | ICD-10-CM | POA: Diagnosis not present

## 2019-02-13 DIAGNOSIS — J301 Allergic rhinitis due to pollen: Secondary | ICD-10-CM | POA: Diagnosis not present

## 2019-02-13 DIAGNOSIS — R5383 Other fatigue: Secondary | ICD-10-CM | POA: Diagnosis not present

## 2019-02-13 DIAGNOSIS — G2581 Restless legs syndrome: Secondary | ICD-10-CM | POA: Diagnosis not present

## 2019-02-13 DIAGNOSIS — J4541 Moderate persistent asthma with (acute) exacerbation: Secondary | ICD-10-CM | POA: Diagnosis not present

## 2019-02-13 DIAGNOSIS — Z20828 Contact with and (suspected) exposure to other viral communicable diseases: Secondary | ICD-10-CM | POA: Diagnosis not present

## 2019-02-14 DIAGNOSIS — Z03818 Encounter for observation for suspected exposure to other biological agents ruled out: Secondary | ICD-10-CM | POA: Diagnosis not present

## 2019-02-17 DIAGNOSIS — E559 Vitamin D deficiency, unspecified: Secondary | ICD-10-CM | POA: Diagnosis not present

## 2019-02-17 DIAGNOSIS — G2581 Restless legs syndrome: Secondary | ICD-10-CM | POA: Diagnosis not present

## 2019-02-17 DIAGNOSIS — R5383 Other fatigue: Secondary | ICD-10-CM | POA: Diagnosis not present

## 2019-02-17 DIAGNOSIS — F411 Generalized anxiety disorder: Secondary | ICD-10-CM | POA: Diagnosis not present

## 2019-02-17 DIAGNOSIS — J301 Allergic rhinitis due to pollen: Secondary | ICD-10-CM | POA: Diagnosis not present

## 2019-02-17 DIAGNOSIS — Z03818 Encounter for observation for suspected exposure to other biological agents ruled out: Secondary | ICD-10-CM | POA: Diagnosis not present

## 2019-02-17 DIAGNOSIS — G4733 Obstructive sleep apnea (adult) (pediatric): Secondary | ICD-10-CM | POA: Diagnosis not present

## 2019-02-17 DIAGNOSIS — J4541 Moderate persistent asthma with (acute) exacerbation: Secondary | ICD-10-CM | POA: Diagnosis not present

## 2019-02-20 DIAGNOSIS — F411 Generalized anxiety disorder: Secondary | ICD-10-CM | POA: Diagnosis not present

## 2019-02-20 DIAGNOSIS — J4541 Moderate persistent asthma with (acute) exacerbation: Secondary | ICD-10-CM | POA: Diagnosis not present

## 2019-02-20 DIAGNOSIS — G4733 Obstructive sleep apnea (adult) (pediatric): Secondary | ICD-10-CM | POA: Diagnosis not present

## 2019-02-20 DIAGNOSIS — E559 Vitamin D deficiency, unspecified: Secondary | ICD-10-CM | POA: Diagnosis not present

## 2019-02-20 DIAGNOSIS — B370 Candidal stomatitis: Secondary | ICD-10-CM | POA: Diagnosis not present

## 2019-02-20 DIAGNOSIS — J301 Allergic rhinitis due to pollen: Secondary | ICD-10-CM | POA: Diagnosis not present

## 2019-02-20 DIAGNOSIS — G2581 Restless legs syndrome: Secondary | ICD-10-CM | POA: Diagnosis not present

## 2019-02-20 DIAGNOSIS — R5383 Other fatigue: Secondary | ICD-10-CM | POA: Diagnosis not present

## 2019-02-21 DIAGNOSIS — R269 Unspecified abnormalities of gait and mobility: Secondary | ICD-10-CM | POA: Diagnosis not present

## 2019-02-21 DIAGNOSIS — J449 Chronic obstructive pulmonary disease, unspecified: Secondary | ICD-10-CM | POA: Diagnosis not present

## 2019-02-23 DIAGNOSIS — G4733 Obstructive sleep apnea (adult) (pediatric): Secondary | ICD-10-CM | POA: Diagnosis not present

## 2019-02-23 DIAGNOSIS — J301 Allergic rhinitis due to pollen: Secondary | ICD-10-CM | POA: Diagnosis not present

## 2019-02-23 DIAGNOSIS — F411 Generalized anxiety disorder: Secondary | ICD-10-CM | POA: Diagnosis not present

## 2019-02-23 DIAGNOSIS — G2581 Restless legs syndrome: Secondary | ICD-10-CM | POA: Diagnosis not present

## 2019-02-23 DIAGNOSIS — R5383 Other fatigue: Secondary | ICD-10-CM | POA: Diagnosis not present

## 2019-02-23 DIAGNOSIS — E559 Vitamin D deficiency, unspecified: Secondary | ICD-10-CM | POA: Diagnosis not present

## 2019-02-23 DIAGNOSIS — B370 Candidal stomatitis: Secondary | ICD-10-CM | POA: Diagnosis not present

## 2019-02-23 DIAGNOSIS — J4541 Moderate persistent asthma with (acute) exacerbation: Secondary | ICD-10-CM | POA: Diagnosis not present

## 2019-02-24 DIAGNOSIS — G4733 Obstructive sleep apnea (adult) (pediatric): Secondary | ICD-10-CM | POA: Diagnosis not present

## 2019-02-28 DIAGNOSIS — Z5181 Encounter for therapeutic drug level monitoring: Secondary | ICD-10-CM | POA: Diagnosis not present

## 2019-02-28 DIAGNOSIS — F411 Generalized anxiety disorder: Secondary | ICD-10-CM | POA: Diagnosis not present

## 2019-02-28 DIAGNOSIS — F331 Major depressive disorder, recurrent, moderate: Secondary | ICD-10-CM | POA: Diagnosis not present

## 2019-02-28 DIAGNOSIS — Z79899 Other long term (current) drug therapy: Secondary | ICD-10-CM | POA: Diagnosis not present

## 2019-02-28 DIAGNOSIS — G894 Chronic pain syndrome: Secondary | ICD-10-CM | POA: Diagnosis not present

## 2019-03-06 DIAGNOSIS — E871 Hypo-osmolality and hyponatremia: Secondary | ICD-10-CM | POA: Diagnosis not present

## 2019-03-06 DIAGNOSIS — F329 Major depressive disorder, single episode, unspecified: Secondary | ICD-10-CM | POA: Diagnosis not present

## 2019-03-06 DIAGNOSIS — M4804 Spinal stenosis, thoracic region: Secondary | ICD-10-CM | POA: Diagnosis not present

## 2019-03-06 DIAGNOSIS — G894 Chronic pain syndrome: Secondary | ICD-10-CM | POA: Diagnosis not present

## 2019-03-06 DIAGNOSIS — I10 Essential (primary) hypertension: Secondary | ICD-10-CM | POA: Diagnosis not present

## 2019-03-06 DIAGNOSIS — M48061 Spinal stenosis, lumbar region without neurogenic claudication: Secondary | ICD-10-CM | POA: Diagnosis not present

## 2019-03-06 DIAGNOSIS — J449 Chronic obstructive pulmonary disease, unspecified: Secondary | ICD-10-CM | POA: Diagnosis not present

## 2019-03-06 DIAGNOSIS — M4807 Spinal stenosis, lumbosacral region: Secondary | ICD-10-CM | POA: Diagnosis not present

## 2019-03-06 DIAGNOSIS — M47812 Spondylosis without myelopathy or radiculopathy, cervical region: Secondary | ICD-10-CM | POA: Diagnosis not present

## 2019-03-09 DIAGNOSIS — J4540 Moderate persistent asthma, uncomplicated: Secondary | ICD-10-CM | POA: Diagnosis not present

## 2019-03-09 DIAGNOSIS — G2581 Restless legs syndrome: Secondary | ICD-10-CM | POA: Diagnosis not present

## 2019-03-09 DIAGNOSIS — G4733 Obstructive sleep apnea (adult) (pediatric): Secondary | ICD-10-CM | POA: Diagnosis not present

## 2019-03-09 DIAGNOSIS — J301 Allergic rhinitis due to pollen: Secondary | ICD-10-CM | POA: Diagnosis not present

## 2019-03-09 DIAGNOSIS — F411 Generalized anxiety disorder: Secondary | ICD-10-CM | POA: Diagnosis not present

## 2019-03-09 DIAGNOSIS — E559 Vitamin D deficiency, unspecified: Secondary | ICD-10-CM | POA: Diagnosis not present

## 2019-03-09 DIAGNOSIS — R5383 Other fatigue: Secondary | ICD-10-CM | POA: Diagnosis not present

## 2019-03-11 DIAGNOSIS — R269 Unspecified abnormalities of gait and mobility: Secondary | ICD-10-CM | POA: Diagnosis not present

## 2019-03-11 DIAGNOSIS — J449 Chronic obstructive pulmonary disease, unspecified: Secondary | ICD-10-CM | POA: Diagnosis not present

## 2019-03-14 DIAGNOSIS — R600 Localized edema: Secondary | ICD-10-CM | POA: Diagnosis not present

## 2019-03-28 DIAGNOSIS — Z79899 Other long term (current) drug therapy: Secondary | ICD-10-CM | POA: Diagnosis not present

## 2019-03-28 DIAGNOSIS — E871 Hypo-osmolality and hyponatremia: Secondary | ICD-10-CM | POA: Diagnosis not present

## 2019-03-28 DIAGNOSIS — Z5181 Encounter for therapeutic drug level monitoring: Secondary | ICD-10-CM | POA: Diagnosis not present

## 2019-03-28 DIAGNOSIS — R600 Localized edema: Secondary | ICD-10-CM | POA: Diagnosis not present

## 2019-03-28 DIAGNOSIS — G894 Chronic pain syndrome: Secondary | ICD-10-CM | POA: Diagnosis not present

## 2019-04-03 DIAGNOSIS — M48061 Spinal stenosis, lumbar region without neurogenic claudication: Secondary | ICD-10-CM | POA: Diagnosis not present

## 2019-04-03 DIAGNOSIS — M4807 Spinal stenosis, lumbosacral region: Secondary | ICD-10-CM | POA: Diagnosis not present

## 2019-04-03 DIAGNOSIS — M4804 Spinal stenosis, thoracic region: Secondary | ICD-10-CM | POA: Diagnosis not present

## 2019-04-03 DIAGNOSIS — E871 Hypo-osmolality and hyponatremia: Secondary | ICD-10-CM | POA: Diagnosis not present

## 2019-04-03 DIAGNOSIS — G894 Chronic pain syndrome: Secondary | ICD-10-CM | POA: Diagnosis not present

## 2019-04-03 DIAGNOSIS — J449 Chronic obstructive pulmonary disease, unspecified: Secondary | ICD-10-CM | POA: Diagnosis not present

## 2019-04-03 DIAGNOSIS — F329 Major depressive disorder, single episode, unspecified: Secondary | ICD-10-CM | POA: Diagnosis not present

## 2019-04-03 DIAGNOSIS — I10 Essential (primary) hypertension: Secondary | ICD-10-CM | POA: Diagnosis not present

## 2019-04-03 DIAGNOSIS — M47812 Spondylosis without myelopathy or radiculopathy, cervical region: Secondary | ICD-10-CM | POA: Diagnosis not present

## 2019-04-09 DIAGNOSIS — G4733 Obstructive sleep apnea (adult) (pediatric): Secondary | ICD-10-CM | POA: Diagnosis not present

## 2019-04-11 DIAGNOSIS — J449 Chronic obstructive pulmonary disease, unspecified: Secondary | ICD-10-CM | POA: Diagnosis not present

## 2019-04-11 DIAGNOSIS — R269 Unspecified abnormalities of gait and mobility: Secondary | ICD-10-CM | POA: Diagnosis not present

## 2019-04-28 DIAGNOSIS — Z5181 Encounter for therapeutic drug level monitoring: Secondary | ICD-10-CM | POA: Diagnosis not present

## 2019-04-28 DIAGNOSIS — G894 Chronic pain syndrome: Secondary | ICD-10-CM | POA: Diagnosis not present

## 2019-04-28 DIAGNOSIS — Z79899 Other long term (current) drug therapy: Secondary | ICD-10-CM | POA: Diagnosis not present

## 2019-04-28 DIAGNOSIS — E871 Hypo-osmolality and hyponatremia: Secondary | ICD-10-CM | POA: Diagnosis not present

## 2019-04-28 DIAGNOSIS — R600 Localized edema: Secondary | ICD-10-CM | POA: Diagnosis not present

## 2019-05-09 DIAGNOSIS — G4733 Obstructive sleep apnea (adult) (pediatric): Secondary | ICD-10-CM | POA: Diagnosis not present

## 2019-05-11 DIAGNOSIS — J449 Chronic obstructive pulmonary disease, unspecified: Secondary | ICD-10-CM | POA: Diagnosis not present

## 2019-05-11 DIAGNOSIS — R269 Unspecified abnormalities of gait and mobility: Secondary | ICD-10-CM | POA: Diagnosis not present

## 2019-05-18 DIAGNOSIS — G43909 Migraine, unspecified, not intractable, without status migrainosus: Secondary | ICD-10-CM | POA: Diagnosis not present

## 2019-05-19 DIAGNOSIS — R1310 Dysphagia, unspecified: Secondary | ICD-10-CM | POA: Diagnosis not present

## 2019-05-29 DIAGNOSIS — J449 Chronic obstructive pulmonary disease, unspecified: Secondary | ICD-10-CM | POA: Diagnosis not present

## 2019-05-29 DIAGNOSIS — G4733 Obstructive sleep apnea (adult) (pediatric): Secondary | ICD-10-CM | POA: Diagnosis not present

## 2019-05-29 DIAGNOSIS — R269 Unspecified abnormalities of gait and mobility: Secondary | ICD-10-CM | POA: Diagnosis not present

## 2019-06-09 DIAGNOSIS — G4733 Obstructive sleep apnea (adult) (pediatric): Secondary | ICD-10-CM | POA: Diagnosis not present

## 2019-06-19 DIAGNOSIS — R1310 Dysphagia, unspecified: Secondary | ICD-10-CM | POA: Diagnosis not present

## 2019-06-29 DIAGNOSIS — F329 Major depressive disorder, single episode, unspecified: Secondary | ICD-10-CM | POA: Diagnosis not present

## 2019-06-29 DIAGNOSIS — Z79899 Other long term (current) drug therapy: Secondary | ICD-10-CM | POA: Diagnosis not present

## 2019-06-29 DIAGNOSIS — D500 Iron deficiency anemia secondary to blood loss (chronic): Secondary | ICD-10-CM | POA: Diagnosis not present

## 2019-06-29 DIAGNOSIS — F419 Anxiety disorder, unspecified: Secondary | ICD-10-CM | POA: Diagnosis not present

## 2019-06-29 DIAGNOSIS — Z5181 Encounter for therapeutic drug level monitoring: Secondary | ICD-10-CM | POA: Diagnosis not present

## 2019-07-05 DIAGNOSIS — G4733 Obstructive sleep apnea (adult) (pediatric): Secondary | ICD-10-CM | POA: Diagnosis not present

## 2019-07-05 DIAGNOSIS — D500 Iron deficiency anemia secondary to blood loss (chronic): Secondary | ICD-10-CM | POA: Diagnosis not present

## 2019-07-05 DIAGNOSIS — G2581 Restless legs syndrome: Secondary | ICD-10-CM | POA: Diagnosis not present

## 2019-07-05 DIAGNOSIS — R5383 Other fatigue: Secondary | ICD-10-CM | POA: Diagnosis not present

## 2019-07-05 DIAGNOSIS — J4540 Moderate persistent asthma, uncomplicated: Secondary | ICD-10-CM | POA: Diagnosis not present

## 2019-07-05 DIAGNOSIS — E559 Vitamin D deficiency, unspecified: Secondary | ICD-10-CM | POA: Diagnosis not present

## 2019-07-05 DIAGNOSIS — J301 Allergic rhinitis due to pollen: Secondary | ICD-10-CM | POA: Diagnosis not present

## 2019-07-05 DIAGNOSIS — F411 Generalized anxiety disorder: Secondary | ICD-10-CM | POA: Diagnosis not present

## 2019-07-09 DIAGNOSIS — G4733 Obstructive sleep apnea (adult) (pediatric): Secondary | ICD-10-CM | POA: Diagnosis not present

## 2019-07-13 DIAGNOSIS — D500 Iron deficiency anemia secondary to blood loss (chronic): Secondary | ICD-10-CM | POA: Diagnosis not present

## 2019-07-13 DIAGNOSIS — K5904 Chronic idiopathic constipation: Secondary | ICD-10-CM | POA: Diagnosis not present

## 2019-07-18 DIAGNOSIS — R1310 Dysphagia, unspecified: Secondary | ICD-10-CM | POA: Diagnosis not present

## 2019-09-26 DIAGNOSIS — G4733 Obstructive sleep apnea (adult) (pediatric): Secondary | ICD-10-CM | POA: Diagnosis not present

## 2019-10-12 DIAGNOSIS — J309 Allergic rhinitis, unspecified: Secondary | ICD-10-CM | POA: Diagnosis not present

## 2019-10-12 DIAGNOSIS — J45901 Unspecified asthma with (acute) exacerbation: Secondary | ICD-10-CM | POA: Diagnosis not present

## 2019-10-12 DIAGNOSIS — R05 Cough: Secondary | ICD-10-CM | POA: Diagnosis not present

## 2019-10-17 DIAGNOSIS — G4733 Obstructive sleep apnea (adult) (pediatric): Secondary | ICD-10-CM | POA: Diagnosis not present

## 2019-10-17 DIAGNOSIS — R05 Cough: Secondary | ICD-10-CM | POA: Diagnosis not present

## 2019-10-17 DIAGNOSIS — J45909 Unspecified asthma, uncomplicated: Secondary | ICD-10-CM | POA: Diagnosis not present

## 2019-10-17 DIAGNOSIS — J301 Allergic rhinitis due to pollen: Secondary | ICD-10-CM | POA: Diagnosis not present

## 2019-10-17 DIAGNOSIS — J4540 Moderate persistent asthma, uncomplicated: Secondary | ICD-10-CM | POA: Diagnosis not present

## 2019-10-20 DIAGNOSIS — H52223 Regular astigmatism, bilateral: Secondary | ICD-10-CM | POA: Diagnosis not present

## 2019-10-20 DIAGNOSIS — H524 Presbyopia: Secondary | ICD-10-CM | POA: Diagnosis not present

## 2019-10-20 DIAGNOSIS — G894 Chronic pain syndrome: Secondary | ICD-10-CM | POA: Diagnosis not present

## 2019-11-17 DIAGNOSIS — R05 Cough: Secondary | ICD-10-CM | POA: Diagnosis not present

## 2019-11-17 DIAGNOSIS — J45909 Unspecified asthma, uncomplicated: Secondary | ICD-10-CM | POA: Diagnosis not present

## 2019-12-21 DIAGNOSIS — E559 Vitamin D deficiency, unspecified: Secondary | ICD-10-CM | POA: Diagnosis not present

## 2019-12-21 DIAGNOSIS — Z6841 Body Mass Index (BMI) 40.0 and over, adult: Secondary | ICD-10-CM | POA: Diagnosis not present

## 2019-12-21 DIAGNOSIS — E785 Hyperlipidemia, unspecified: Secondary | ICD-10-CM | POA: Diagnosis not present

## 2019-12-21 DIAGNOSIS — R69 Illness, unspecified: Secondary | ICD-10-CM | POA: Diagnosis not present

## 2019-12-21 DIAGNOSIS — Z1389 Encounter for screening for other disorder: Secondary | ICD-10-CM | POA: Diagnosis not present

## 2019-12-21 DIAGNOSIS — I10 Essential (primary) hypertension: Secondary | ICD-10-CM | POA: Diagnosis not present

## 2019-12-21 DIAGNOSIS — D649 Anemia, unspecified: Secondary | ICD-10-CM | POA: Diagnosis not present

## 2019-12-21 DIAGNOSIS — Z0000 Encounter for general adult medical examination without abnormal findings: Secondary | ICD-10-CM | POA: Diagnosis not present

## 2019-12-21 DIAGNOSIS — Z5181 Encounter for therapeutic drug level monitoring: Secondary | ICD-10-CM | POA: Diagnosis not present

## 2019-12-21 DIAGNOSIS — E876 Hypokalemia: Secondary | ICD-10-CM | POA: Diagnosis not present

## 2019-12-21 DIAGNOSIS — Z79899 Other long term (current) drug therapy: Secondary | ICD-10-CM | POA: Diagnosis not present

## 2019-12-28 DIAGNOSIS — R05 Cough: Secondary | ICD-10-CM | POA: Diagnosis not present

## 2019-12-28 DIAGNOSIS — G4733 Obstructive sleep apnea (adult) (pediatric): Secondary | ICD-10-CM | POA: Diagnosis not present

## 2019-12-28 DIAGNOSIS — J4540 Moderate persistent asthma, uncomplicated: Secondary | ICD-10-CM | POA: Diagnosis not present

## 2019-12-28 DIAGNOSIS — J301 Allergic rhinitis due to pollen: Secondary | ICD-10-CM | POA: Diagnosis not present

## 2019-12-28 DIAGNOSIS — R0600 Dyspnea, unspecified: Secondary | ICD-10-CM | POA: Diagnosis not present

## 2019-12-28 DIAGNOSIS — R748 Abnormal levels of other serum enzymes: Secondary | ICD-10-CM | POA: Diagnosis not present

## 2019-12-29 DIAGNOSIS — R1310 Dysphagia, unspecified: Secondary | ICD-10-CM | POA: Diagnosis not present

## 2020-01-10 DIAGNOSIS — G4733 Obstructive sleep apnea (adult) (pediatric): Secondary | ICD-10-CM | POA: Diagnosis not present

## 2020-01-12 DIAGNOSIS — N390 Urinary tract infection, site not specified: Secondary | ICD-10-CM | POA: Diagnosis not present

## 2020-01-12 DIAGNOSIS — R2689 Other abnormalities of gait and mobility: Secondary | ICD-10-CM | POA: Diagnosis not present

## 2020-01-17 DIAGNOSIS — R05 Cough: Secondary | ICD-10-CM | POA: Diagnosis not present

## 2020-01-17 DIAGNOSIS — J45909 Unspecified asthma, uncomplicated: Secondary | ICD-10-CM | POA: Diagnosis not present

## 2020-01-19 DIAGNOSIS — Z1231 Encounter for screening mammogram for malignant neoplasm of breast: Secondary | ICD-10-CM | POA: Diagnosis not present

## 2020-01-30 DIAGNOSIS — R1310 Dysphagia, unspecified: Secondary | ICD-10-CM | POA: Diagnosis not present

## 2020-02-16 DIAGNOSIS — J45909 Unspecified asthma, uncomplicated: Secondary | ICD-10-CM | POA: Diagnosis not present

## 2020-02-16 DIAGNOSIS — R05 Cough: Secondary | ICD-10-CM | POA: Diagnosis not present

## 2020-02-22 DIAGNOSIS — R42 Dizziness and giddiness: Secondary | ICD-10-CM | POA: Diagnosis not present

## 2020-02-22 DIAGNOSIS — N183 Chronic kidney disease, stage 3 unspecified: Secondary | ICD-10-CM | POA: Diagnosis not present

## 2020-02-22 DIAGNOSIS — G894 Chronic pain syndrome: Secondary | ICD-10-CM | POA: Diagnosis not present

## 2020-02-22 DIAGNOSIS — R748 Abnormal levels of other serum enzymes: Secondary | ICD-10-CM | POA: Diagnosis not present

## 2020-02-28 DIAGNOSIS — R1310 Dysphagia, unspecified: Secondary | ICD-10-CM | POA: Diagnosis not present

## 2020-03-08 DIAGNOSIS — G4733 Obstructive sleep apnea (adult) (pediatric): Secondary | ICD-10-CM | POA: Diagnosis not present

## 2020-03-08 DIAGNOSIS — J301 Allergic rhinitis due to pollen: Secondary | ICD-10-CM | POA: Diagnosis not present

## 2020-03-08 DIAGNOSIS — J4540 Moderate persistent asthma, uncomplicated: Secondary | ICD-10-CM | POA: Diagnosis not present

## 2020-03-18 DIAGNOSIS — J45909 Unspecified asthma, uncomplicated: Secondary | ICD-10-CM | POA: Diagnosis not present

## 2020-03-18 DIAGNOSIS — R05 Cough: Secondary | ICD-10-CM | POA: Diagnosis not present

## 2020-04-18 DIAGNOSIS — J45909 Unspecified asthma, uncomplicated: Secondary | ICD-10-CM | POA: Diagnosis not present

## 2020-04-18 DIAGNOSIS — R05 Cough: Secondary | ICD-10-CM | POA: Diagnosis not present

## 2020-04-30 DIAGNOSIS — I129 Hypertensive chronic kidney disease with stage 1 through stage 4 chronic kidney disease, or unspecified chronic kidney disease: Secondary | ICD-10-CM | POA: Diagnosis not present

## 2020-04-30 DIAGNOSIS — Z23 Encounter for immunization: Secondary | ICD-10-CM | POA: Diagnosis not present

## 2020-04-30 DIAGNOSIS — N183 Chronic kidney disease, stage 3 unspecified: Secondary | ICD-10-CM | POA: Diagnosis not present

## 2020-04-30 DIAGNOSIS — G43909 Migraine, unspecified, not intractable, without status migrainosus: Secondary | ICD-10-CM | POA: Diagnosis not present

## 2020-04-30 DIAGNOSIS — G894 Chronic pain syndrome: Secondary | ICD-10-CM | POA: Diagnosis not present

## 2020-04-30 DIAGNOSIS — G4733 Obstructive sleep apnea (adult) (pediatric): Secondary | ICD-10-CM | POA: Diagnosis not present

## 2020-05-01 DIAGNOSIS — J449 Chronic obstructive pulmonary disease, unspecified: Secondary | ICD-10-CM | POA: Diagnosis not present

## 2020-05-01 DIAGNOSIS — R269 Unspecified abnormalities of gait and mobility: Secondary | ICD-10-CM | POA: Diagnosis not present

## 2020-05-10 DIAGNOSIS — D500 Iron deficiency anemia secondary to blood loss (chronic): Secondary | ICD-10-CM | POA: Diagnosis not present

## 2020-05-14 DIAGNOSIS — D500 Iron deficiency anemia secondary to blood loss (chronic): Secondary | ICD-10-CM | POA: Diagnosis not present

## 2020-05-14 DIAGNOSIS — K5904 Chronic idiopathic constipation: Secondary | ICD-10-CM | POA: Diagnosis not present

## 2020-05-22 DIAGNOSIS — I10 Essential (primary) hypertension: Secondary | ICD-10-CM | POA: Diagnosis not present

## 2020-05-22 DIAGNOSIS — R6889 Other general symptoms and signs: Secondary | ICD-10-CM | POA: Diagnosis not present

## 2020-05-22 DIAGNOSIS — K219 Gastro-esophageal reflux disease without esophagitis: Secondary | ICD-10-CM | POA: Diagnosis not present

## 2020-05-22 DIAGNOSIS — Z79899 Other long term (current) drug therapy: Secondary | ICD-10-CM | POA: Diagnosis not present

## 2020-05-22 DIAGNOSIS — M25551 Pain in right hip: Secondary | ICD-10-CM | POA: Diagnosis not present

## 2020-05-22 DIAGNOSIS — Z23 Encounter for immunization: Secondary | ICD-10-CM | POA: Diagnosis not present

## 2020-05-22 DIAGNOSIS — S79921A Unspecified injury of right thigh, initial encounter: Secondary | ICD-10-CM | POA: Diagnosis not present

## 2020-05-22 DIAGNOSIS — M25572 Pain in left ankle and joints of left foot: Secondary | ICD-10-CM | POA: Diagnosis not present

## 2020-05-22 DIAGNOSIS — M778 Other enthesopathies, not elsewhere classified: Secondary | ICD-10-CM | POA: Diagnosis not present

## 2020-05-22 DIAGNOSIS — S8011XA Contusion of right lower leg, initial encounter: Secondary | ICD-10-CM | POA: Diagnosis not present

## 2020-05-22 DIAGNOSIS — S40022A Contusion of left upper arm, initial encounter: Secondary | ICD-10-CM | POA: Diagnosis not present

## 2020-05-22 DIAGNOSIS — Z743 Need for continuous supervision: Secondary | ICD-10-CM | POA: Diagnosis not present

## 2020-05-22 DIAGNOSIS — M25561 Pain in right knee: Secondary | ICD-10-CM | POA: Diagnosis not present

## 2020-05-22 DIAGNOSIS — S0990XA Unspecified injury of head, initial encounter: Secondary | ICD-10-CM | POA: Diagnosis not present

## 2020-05-22 DIAGNOSIS — S4992XA Unspecified injury of left shoulder and upper arm, initial encounter: Secondary | ICD-10-CM | POA: Diagnosis not present

## 2020-05-22 DIAGNOSIS — M25522 Pain in left elbow: Secondary | ICD-10-CM | POA: Diagnosis not present

## 2020-05-22 DIAGNOSIS — M160 Bilateral primary osteoarthritis of hip: Secondary | ICD-10-CM | POA: Diagnosis not present

## 2020-05-22 DIAGNOSIS — M1812 Unilateral primary osteoarthritis of first carpometacarpal joint, left hand: Secondary | ICD-10-CM | POA: Diagnosis not present

## 2020-05-22 DIAGNOSIS — S0101XA Laceration without foreign body of scalp, initial encounter: Secondary | ICD-10-CM | POA: Diagnosis not present

## 2020-05-27 DIAGNOSIS — R059 Cough, unspecified: Secondary | ICD-10-CM | POA: Diagnosis not present

## 2020-05-27 DIAGNOSIS — J45909 Unspecified asthma, uncomplicated: Secondary | ICD-10-CM | POA: Diagnosis not present

## 2020-05-30 DIAGNOSIS — J40 Bronchitis, not specified as acute or chronic: Secondary | ICD-10-CM | POA: Diagnosis not present

## 2020-05-30 DIAGNOSIS — R600 Localized edema: Secondary | ICD-10-CM | POA: Diagnosis not present

## 2020-05-30 DIAGNOSIS — S9031XA Contusion of right foot, initial encounter: Secondary | ICD-10-CM | POA: Diagnosis not present

## 2020-05-30 DIAGNOSIS — W19XXXD Unspecified fall, subsequent encounter: Secondary | ICD-10-CM | POA: Diagnosis not present

## 2020-05-30 DIAGNOSIS — S9001XA Contusion of right ankle, initial encounter: Secondary | ICD-10-CM | POA: Diagnosis not present

## 2020-05-30 DIAGNOSIS — M7731 Calcaneal spur, right foot: Secondary | ICD-10-CM | POA: Diagnosis not present

## 2020-05-30 DIAGNOSIS — Z5181 Encounter for therapeutic drug level monitoring: Secondary | ICD-10-CM | POA: Diagnosis not present

## 2020-05-30 DIAGNOSIS — M25571 Pain in right ankle and joints of right foot: Secondary | ICD-10-CM | POA: Diagnosis not present

## 2020-05-30 DIAGNOSIS — R062 Wheezing: Secondary | ICD-10-CM | POA: Diagnosis not present

## 2020-06-13 DIAGNOSIS — G4733 Obstructive sleep apnea (adult) (pediatric): Secondary | ICD-10-CM | POA: Diagnosis not present

## 2020-06-13 DIAGNOSIS — J4540 Moderate persistent asthma, uncomplicated: Secondary | ICD-10-CM | POA: Diagnosis not present

## 2020-06-13 DIAGNOSIS — Z7189 Other specified counseling: Secondary | ICD-10-CM | POA: Diagnosis not present

## 2020-06-13 DIAGNOSIS — J301 Allergic rhinitis due to pollen: Secondary | ICD-10-CM | POA: Diagnosis not present

## 2020-06-26 DIAGNOSIS — J449 Chronic obstructive pulmonary disease, unspecified: Secondary | ICD-10-CM | POA: Diagnosis not present

## 2020-06-26 DIAGNOSIS — R269 Unspecified abnormalities of gait and mobility: Secondary | ICD-10-CM | POA: Diagnosis not present

## 2020-06-27 DIAGNOSIS — J4540 Moderate persistent asthma, uncomplicated: Secondary | ICD-10-CM | POA: Diagnosis not present

## 2020-06-27 DIAGNOSIS — Z7189 Other specified counseling: Secondary | ICD-10-CM | POA: Diagnosis not present

## 2020-06-27 DIAGNOSIS — G4733 Obstructive sleep apnea (adult) (pediatric): Secondary | ICD-10-CM | POA: Diagnosis not present

## 2020-06-27 DIAGNOSIS — J301 Allergic rhinitis due to pollen: Secondary | ICD-10-CM | POA: Diagnosis not present

## 2020-07-04 DIAGNOSIS — N390 Urinary tract infection, site not specified: Secondary | ICD-10-CM | POA: Diagnosis not present

## 2020-07-04 DIAGNOSIS — G43109 Migraine with aura, not intractable, without status migrainosus: Secondary | ICD-10-CM | POA: Diagnosis not present

## 2020-07-04 DIAGNOSIS — G894 Chronic pain syndrome: Secondary | ICD-10-CM | POA: Diagnosis not present

## 2020-07-04 DIAGNOSIS — Z5181 Encounter for therapeutic drug level monitoring: Secondary | ICD-10-CM | POA: Diagnosis not present

## 2020-07-04 DIAGNOSIS — I10 Essential (primary) hypertension: Secondary | ICD-10-CM | POA: Diagnosis not present

## 2020-07-04 DIAGNOSIS — R300 Dysuria: Secondary | ICD-10-CM | POA: Diagnosis not present

## 2020-07-18 DIAGNOSIS — R059 Cough, unspecified: Secondary | ICD-10-CM | POA: Diagnosis not present

## 2020-07-18 DIAGNOSIS — J45909 Unspecified asthma, uncomplicated: Secondary | ICD-10-CM | POA: Diagnosis not present

## 2020-08-01 DIAGNOSIS — R269 Unspecified abnormalities of gait and mobility: Secondary | ICD-10-CM | POA: Diagnosis not present

## 2020-08-01 DIAGNOSIS — J449 Chronic obstructive pulmonary disease, unspecified: Secondary | ICD-10-CM | POA: Diagnosis not present

## 2020-08-02 DIAGNOSIS — Z79899 Other long term (current) drug therapy: Secondary | ICD-10-CM | POA: Diagnosis not present

## 2020-08-02 DIAGNOSIS — G43909 Migraine, unspecified, not intractable, without status migrainosus: Secondary | ICD-10-CM | POA: Diagnosis not present

## 2020-08-02 DIAGNOSIS — R300 Dysuria: Secondary | ICD-10-CM | POA: Diagnosis not present

## 2020-08-02 DIAGNOSIS — G894 Chronic pain syndrome: Secondary | ICD-10-CM | POA: Diagnosis not present

## 2020-08-02 DIAGNOSIS — Z5181 Encounter for therapeutic drug level monitoring: Secondary | ICD-10-CM | POA: Diagnosis not present

## 2020-08-02 DIAGNOSIS — R0600 Dyspnea, unspecified: Secondary | ICD-10-CM | POA: Diagnosis not present

## 2020-08-02 DIAGNOSIS — G43109 Migraine with aura, not intractable, without status migrainosus: Secondary | ICD-10-CM | POA: Diagnosis not present

## 2020-08-07 DIAGNOSIS — J449 Chronic obstructive pulmonary disease, unspecified: Secondary | ICD-10-CM | POA: Diagnosis not present

## 2020-08-08 DIAGNOSIS — J301 Allergic rhinitis due to pollen: Secondary | ICD-10-CM | POA: Diagnosis not present

## 2020-08-08 DIAGNOSIS — G4733 Obstructive sleep apnea (adult) (pediatric): Secondary | ICD-10-CM | POA: Diagnosis not present

## 2020-08-08 DIAGNOSIS — J4540 Moderate persistent asthma, uncomplicated: Secondary | ICD-10-CM | POA: Diagnosis not present

## 2020-08-18 DIAGNOSIS — R059 Cough, unspecified: Secondary | ICD-10-CM | POA: Diagnosis not present

## 2020-08-18 DIAGNOSIS — J45909 Unspecified asthma, uncomplicated: Secondary | ICD-10-CM | POA: Diagnosis not present

## 2020-08-29 DIAGNOSIS — L237 Allergic contact dermatitis due to plants, except food: Secondary | ICD-10-CM | POA: Diagnosis not present

## 2020-08-29 DIAGNOSIS — U071 COVID-19: Secondary | ICD-10-CM | POA: Diagnosis not present

## 2020-08-29 DIAGNOSIS — G894 Chronic pain syndrome: Secondary | ICD-10-CM | POA: Diagnosis not present

## 2020-09-01 DIAGNOSIS — J449 Chronic obstructive pulmonary disease, unspecified: Secondary | ICD-10-CM | POA: Diagnosis not present

## 2020-09-01 DIAGNOSIS — R269 Unspecified abnormalities of gait and mobility: Secondary | ICD-10-CM | POA: Diagnosis not present

## 2020-09-09 DIAGNOSIS — R21 Rash and other nonspecific skin eruption: Secondary | ICD-10-CM | POA: Diagnosis not present

## 2020-09-09 DIAGNOSIS — Z8616 Personal history of COVID-19: Secondary | ICD-10-CM | POA: Diagnosis not present

## 2020-09-09 DIAGNOSIS — R300 Dysuria: Secondary | ICD-10-CM | POA: Diagnosis not present

## 2020-09-11 DIAGNOSIS — M85851 Other specified disorders of bone density and structure, right thigh: Secondary | ICD-10-CM | POA: Diagnosis not present

## 2020-09-11 DIAGNOSIS — M810 Age-related osteoporosis without current pathological fracture: Secondary | ICD-10-CM | POA: Diagnosis not present

## 2020-09-16 ENCOUNTER — Encounter: Payer: Self-pay | Admitting: Endocrinology

## 2020-09-29 DIAGNOSIS — R269 Unspecified abnormalities of gait and mobility: Secondary | ICD-10-CM | POA: Diagnosis not present

## 2020-09-29 DIAGNOSIS — J449 Chronic obstructive pulmonary disease, unspecified: Secondary | ICD-10-CM | POA: Diagnosis not present

## 2020-10-01 DIAGNOSIS — J45909 Unspecified asthma, uncomplicated: Secondary | ICD-10-CM | POA: Diagnosis not present

## 2020-10-01 DIAGNOSIS — E785 Hyperlipidemia, unspecified: Secondary | ICD-10-CM | POA: Diagnosis not present

## 2020-10-08 DIAGNOSIS — R7989 Other specified abnormal findings of blood chemistry: Secondary | ICD-10-CM | POA: Diagnosis not present

## 2020-10-08 DIAGNOSIS — R109 Unspecified abdominal pain: Secondary | ICD-10-CM | POA: Diagnosis not present

## 2020-10-08 DIAGNOSIS — Z5181 Encounter for therapeutic drug level monitoring: Secondary | ICD-10-CM | POA: Diagnosis not present

## 2020-10-08 DIAGNOSIS — Z79899 Other long term (current) drug therapy: Secondary | ICD-10-CM | POA: Diagnosis not present

## 2020-10-08 DIAGNOSIS — G894 Chronic pain syndrome: Secondary | ICD-10-CM | POA: Diagnosis not present

## 2020-10-08 DIAGNOSIS — G43109 Migraine with aura, not intractable, without status migrainosus: Secondary | ICD-10-CM | POA: Diagnosis not present

## 2020-10-08 DIAGNOSIS — R21 Rash and other nonspecific skin eruption: Secondary | ICD-10-CM | POA: Diagnosis not present

## 2020-10-08 DIAGNOSIS — E785 Hyperlipidemia, unspecified: Secondary | ICD-10-CM | POA: Diagnosis not present

## 2020-10-25 ENCOUNTER — Encounter: Payer: Self-pay | Admitting: Endocrinology

## 2020-10-25 ENCOUNTER — Ambulatory Visit (INDEPENDENT_AMBULATORY_CARE_PROVIDER_SITE_OTHER): Payer: Medicare Other | Admitting: Endocrinology

## 2020-10-25 ENCOUNTER — Other Ambulatory Visit: Payer: Self-pay

## 2020-10-25 DIAGNOSIS — M810 Age-related osteoporosis without current pathological fracture: Secondary | ICD-10-CM

## 2020-10-25 LAB — ALKALINE PHOSPHATASE: Alkaline Phosphatase: 125 U/L — ABNORMAL HIGH (ref 39–117)

## 2020-10-25 LAB — VITAMIN D 25 HYDROXY (VIT D DEFICIENCY, FRACTURES): VITD: 35.64 ng/mL (ref 30.00–100.00)

## 2020-10-25 NOTE — Patient Instructions (Addendum)
Blood tests are requested for you today.  We'll let you know about the results.  We are calling [facility], to request results of a bone density test.  If they don't have one, I will request that you have it done.   These results will probably tell us that we need to keep an eye on this.   Please come back for a follow-up appointment in 6 months.

## 2020-10-25 NOTE — Progress Notes (Signed)
Subjective:    Patient ID: Jamie Fields, female    DOB: 01-03-1956, 65 y.o.   MRN: [id number]  HPI Pt is referred by Jaclynn Major, NP, for hypercalcemia.  Pt was noted to have hypercalcemia 3 mos ago.  she has never had urolithiasis, thyroid probs, sarcoidosis, cancer, PUD, pancreatitis, depression.  Only bony fracture was left hand (2020).  He does not take vitamin-A supplement.  Pt has no h/o any of these: [place], or prolonged immobilization.  Pt denies taking antacids, Li++, or HCTZ.  Main symptom is tingling of the legs.  She takes Vitamin-D, 4000 units QAM.  Pt says she takes no medication for osteoporosis.   Past Medical History:  Diagnosis Date  . Anxiety   . Asthma   . COPD (chronic obstructive pulmonary disease) ([place])   . Depression   . GERD (gastroesophageal reflux disease)   . HTN (hypertension)     Past Surgical History:  Procedure Laterality Date  . TRACHEOSTOMY     feinstein    Social History   Socioeconomic History  . Marital status: Widowed    Spouse name: Not on file  . Number of children: Not on file  . Years of education: Not on file  . Highest education level: Not on file  Occupational History  . Not on file  Tobacco Use  . Smoking status: Never Smoker  . Smokeless tobacco: Not on file  Substance and Sexual Activity  . Alcohol use: Not on file  . Drug use: Not on file  . Sexual activity: Not on file  Other Topics Concern  . Not on file  Social History Narrative  . Not on file   Social Determinants of Health   Financial Resource Strain: Not on file  Food Insecurity: Not on file  Transportation Needs: Not on file  Physical Activity: Not on file  Stress: Not on file  Social Connections: Not on file  Intimate Partner Violence: Not on file    Current Outpatient Medications on File Prior to Visit  Medication Sig Dispense Refill  . acetaminophen (TYLENOL) 160 MG/5ML solution Take 20.3 mLs (650 mg total) by mouth every 6 (six) hours as  needed for mild pain or fever. 120 mL 0  . albuterol (PROVENTIL) (2.5 MG/3ML) 0.083% nebulizer solution Take 2.5 mg by nebulization every 4 (four) hours as needed for wheezing or shortness of breath.     Marland Kitchen atorvastatin (LIPITOR) 10 MG tablet Take 10 mg by mouth at bedtime.     . diclofenac (VOLTAREN) 75 MG EC tablet Take 75 mg by mouth 2 (two) times daily as needed (pain).     . insulin aspart (NOVOLOG) 100 UNIT/ML injection Inject 0-20 Units into the skin every 4 (four) hours. 10 mL 11  . ipratropium-albuterol (DUONEB) 0.5-2.5 (3) MG/3ML SOLN Take 3 mLs by nebulization every 2 (two) hours as needed. 360 mL   . labetalol (NORMODYNE) 200 MG tablet Place 1 tablet (200 mg total) into feeding tube 3 (three) times daily.    Marland Kitchen loperamide (IMODIUM) 1 MG/5ML solution Take 20 mLs (4 mg total) by mouth as needed for diarrhea or loose stools. 120 mL 0  . Nutritional Supplements (FEEDING SUPPLEMENT, VITAL AF 1.2 CAL,) LIQD 59ml/hr    . ondansetron (ZOFRAN) 8 MG tablet Take 8 mg by mouth 3 (three) times daily as needed for nausea or vomiting.    . pantoprazole sodium (PROTONIX) 40 mg/20 mL PACK Place 20 mLs (40 mg total) into feeding tube daily.  30 each   . sertraline (ZOLOFT) 100 MG tablet Take 200 mg by mouth daily.     . SUMAtriptan (IMITREX) 50 MG tablet Take 50 mg by mouth every 2 (two) hours as needed for migraine. Do not exceed 200 mg in 24 hours     No current facility-administered medications on file prior to visit.    No Known Allergies  Family History  Problem Relation Age of Onset  . Hyperlipidemia Mother   . Hypertension Father   . Hypercalcemia Neg Hx     BP 110/60 (BP Location: Right Arm, Patient Position: Sitting, Cuff Size: Large)   Pulse 85   Ht 5\' 7"  (1.702 m)   Wt 250 lb 6.4 oz (113.6 kg)   SpO2 96%   BMI 39.22 kg/m     Review of Systems Denies weight loss.  No change in chronic depression or back pain.    Objective:   Physical Exam VITAL SIGNS:  See vs page GENERAL:  no distress NECK: There is no palpable thyroid enlargement.  No thyroid nodule is palpable.  No palpable lymphadenopathy at the anterior neck. SPINE: no kyphosis GAIT: steady, with a cane.    outside test results are reviewed: PTH=82 Ca++=10.8 Creat=1.1  DEXA (2022): worst T-score was -1.7 (RFN)  I have reviewed outside records, and summarized: Pt was noted to have elevated Ca++, and referred here.  Rash, chronic pain, and URI were also addressed      Assessment & Plan:  Hyperparathyroidism, prob primary, new to me.   Patient Instructions  Blood tests are requested for you today.  We'll let you know about the results.  We are calling [facility], to request results of a bone density test.  If they don't have one, I will request that you have it done.   These results will probably tell us that we need to keep an eye on this.   Please come back for a follow-up appointment in 6 months.

## 2020-10-26 LAB — PTH, INTACT AND CALCIUM
Calcium: 11.2 mg/dL — ABNORMAL HIGH (ref 8.6–10.4)
PTH: 97 pg/mL — ABNORMAL HIGH (ref 16–77)

## 2020-10-30 DIAGNOSIS — R269 Unspecified abnormalities of gait and mobility: Secondary | ICD-10-CM | POA: Diagnosis not present

## 2020-10-30 DIAGNOSIS — J449 Chronic obstructive pulmonary disease, unspecified: Secondary | ICD-10-CM | POA: Diagnosis not present

## 2020-10-31 DIAGNOSIS — J4540 Moderate persistent asthma, uncomplicated: Secondary | ICD-10-CM | POA: Diagnosis not present

## 2020-10-31 DIAGNOSIS — R059 Cough, unspecified: Secondary | ICD-10-CM | POA: Diagnosis not present

## 2020-10-31 DIAGNOSIS — J301 Allergic rhinitis due to pollen: Secondary | ICD-10-CM | POA: Diagnosis not present

## 2020-10-31 DIAGNOSIS — G4733 Obstructive sleep apnea (adult) (pediatric): Secondary | ICD-10-CM | POA: Diagnosis not present

## 2020-11-01 LAB — PROTEIN ELECTROPHORESIS, SERUM
Albumin ELP: 4.1 g/dL (ref 3.8–4.8)
Alpha 1: 0.3 g/dL (ref 0.2–0.3)
Alpha 2: 0.6 g/dL (ref 0.5–0.9)
Beta 2: 0.3 g/dL (ref 0.2–0.5)
Beta Globulin: 0.4 g/dL (ref 0.4–0.6)
Gamma Globulin: 0.7 g/dL — ABNORMAL LOW (ref 0.8–1.7)
Total Protein: 6.5 g/dL (ref 6.1–8.1)

## 2020-11-01 LAB — VITAMIN D 1,25 DIHYDROXY
Vitamin D 1, 25 (OH)2 Total: 23 pg/mL (ref 18–72)
Vitamin D2 1, 25 (OH)2: <8 pg/mL
Vitamin D3 1, 25 (OH)2: 23 pg/mL

## 2020-11-01 LAB — VITAMIN A: Vitamin A (Retinoic Acid): 57 ug/dL (ref 38–98)

## 2020-11-01 LAB — PTH-RELATED PEPTIDE: PTH-Related Protein (PTH-RP): 9 pg/mL — ABNORMAL LOW (ref 11–20)

## 2020-11-04 ENCOUNTER — Telehealth: Payer: Self-pay

## 2020-11-04 DIAGNOSIS — M810 Age-related osteoporosis without current pathological fracture: Secondary | ICD-10-CM

## 2020-11-04 NOTE — Telephone Encounter (Addendum)
Pt advised of results and advised to call back regarding Bone Density Scan ----- Message from Renato Shin, MD sent at 11/03/2020  1:45 PM EDT ----- please contact patient: Normal, except for a mildly overactive parathyroid.  Please continue the same Vitamin-D, as this is helping.  You should have a bone density test done.  Do you want to schedule at [place], or here in [place]?

## 2020-11-05 NOTE — Telephone Encounter (Signed)
Ok, I placed request

## 2020-11-05 NOTE — Addendum Note (Signed)
Addended by: Renato Shin on: 11/05/2020 11:36 AM   Modules accepted: Orders

## 2020-11-05 NOTE — Telephone Encounter (Signed)
Patient returned call re: Patient requests Bone Density Test be done at [facility] in [place]. Patient states she will need 3 days advance notice of appointment date and requests to be called at ph# [phone] once Order for Bone Density Test has been sent to [facility].

## 2020-11-05 NOTE — Telephone Encounter (Signed)
Pt advised she wants to have her scan done at [facility].

## 2020-11-06 DIAGNOSIS — H259 Unspecified age-related cataract: Secondary | ICD-10-CM | POA: Diagnosis not present

## 2020-11-06 DIAGNOSIS — H52223 Regular astigmatism, bilateral: Secondary | ICD-10-CM | POA: Diagnosis not present

## 2020-11-13 DIAGNOSIS — H2513 Age-related nuclear cataract, bilateral: Secondary | ICD-10-CM | POA: Diagnosis not present

## 2020-11-13 DIAGNOSIS — H2511 Age-related nuclear cataract, right eye: Secondary | ICD-10-CM | POA: Diagnosis not present

## 2020-11-20 DIAGNOSIS — J40 Bronchitis, not specified as acute or chronic: Secondary | ICD-10-CM | POA: Diagnosis not present

## 2020-11-20 DIAGNOSIS — R918 Other nonspecific abnormal finding of lung field: Secondary | ICD-10-CM | POA: Diagnosis not present

## 2020-11-20 DIAGNOSIS — J8489 Other specified interstitial pulmonary diseases: Secondary | ICD-10-CM | POA: Diagnosis not present

## 2020-11-20 DIAGNOSIS — J90 Pleural effusion, not elsewhere classified: Secondary | ICD-10-CM | POA: Diagnosis not present

## 2020-11-20 DIAGNOSIS — J984 Other disorders of lung: Secondary | ICD-10-CM | POA: Diagnosis not present

## 2020-11-20 DIAGNOSIS — R059 Cough, unspecified: Secondary | ICD-10-CM | POA: Diagnosis not present

## 2020-11-22 DIAGNOSIS — R109 Unspecified abdominal pain: Secondary | ICD-10-CM | POA: Diagnosis not present

## 2020-11-22 DIAGNOSIS — G43119 Migraine with aura, intractable, without status migrainosus: Secondary | ICD-10-CM | POA: Diagnosis not present

## 2020-11-23 DIAGNOSIS — I10 Essential (primary) hypertension: Secondary | ICD-10-CM | POA: Diagnosis not present

## 2020-11-23 DIAGNOSIS — E785 Hyperlipidemia, unspecified: Secondary | ICD-10-CM | POA: Diagnosis not present

## 2020-11-27 DIAGNOSIS — R1310 Dysphagia, unspecified: Secondary | ICD-10-CM | POA: Diagnosis not present

## 2020-11-28 DIAGNOSIS — J4540 Moderate persistent asthma, uncomplicated: Secondary | ICD-10-CM | POA: Diagnosis not present

## 2020-11-28 DIAGNOSIS — J301 Allergic rhinitis due to pollen: Secondary | ICD-10-CM | POA: Diagnosis not present

## 2020-11-28 DIAGNOSIS — G4733 Obstructive sleep apnea (adult) (pediatric): Secondary | ICD-10-CM | POA: Diagnosis not present

## 2020-11-28 DIAGNOSIS — Z8616 Personal history of COVID-19: Secondary | ICD-10-CM | POA: Diagnosis not present

## 2020-11-28 DIAGNOSIS — R059 Cough, unspecified: Secondary | ICD-10-CM | POA: Diagnosis not present

## 2020-11-29 DIAGNOSIS — R269 Unspecified abnormalities of gait and mobility: Secondary | ICD-10-CM | POA: Diagnosis not present

## 2020-11-29 DIAGNOSIS — R109 Unspecified abdominal pain: Secondary | ICD-10-CM | POA: Diagnosis not present

## 2020-11-29 DIAGNOSIS — J449 Chronic obstructive pulmonary disease, unspecified: Secondary | ICD-10-CM | POA: Diagnosis not present

## 2020-12-03 ENCOUNTER — Telehealth: Payer: Self-pay | Admitting: Endocrinology

## 2020-12-03 NOTE — Telephone Encounter (Signed)
please contact patient: Bone density is just mildly low.  I'll see you next time.

## 2020-12-10 DIAGNOSIS — Z79899 Other long term (current) drug therapy: Secondary | ICD-10-CM | POA: Diagnosis not present

## 2020-12-10 DIAGNOSIS — G43909 Migraine, unspecified, not intractable, without status migrainosus: Secondary | ICD-10-CM | POA: Diagnosis not present

## 2020-12-10 DIAGNOSIS — Z8249 Family history of ischemic heart disease and other diseases of the circulatory system: Secondary | ICD-10-CM | POA: Diagnosis not present

## 2020-12-10 DIAGNOSIS — H2513 Age-related nuclear cataract, bilateral: Secondary | ICD-10-CM | POA: Diagnosis not present

## 2020-12-10 DIAGNOSIS — N189 Chronic kidney disease, unspecified: Secondary | ICD-10-CM | POA: Diagnosis not present

## 2020-12-10 DIAGNOSIS — Z83518 Family history of other specified eye disorder: Secondary | ICD-10-CM | POA: Diagnosis not present

## 2020-12-10 DIAGNOSIS — Z833 Family history of diabetes mellitus: Secondary | ICD-10-CM | POA: Diagnosis not present

## 2020-12-10 DIAGNOSIS — H2511 Age-related nuclear cataract, right eye: Secondary | ICD-10-CM | POA: Diagnosis not present

## 2020-12-10 DIAGNOSIS — G4730 Sleep apnea, unspecified: Secondary | ICD-10-CM | POA: Diagnosis not present

## 2020-12-10 DIAGNOSIS — J449 Chronic obstructive pulmonary disease, unspecified: Secondary | ICD-10-CM | POA: Diagnosis not present

## 2020-12-10 DIAGNOSIS — K219 Gastro-esophageal reflux disease without esophagitis: Secondary | ICD-10-CM | POA: Diagnosis not present

## 2020-12-10 DIAGNOSIS — I129 Hypertensive chronic kidney disease with stage 1 through stage 4 chronic kidney disease, or unspecified chronic kidney disease: Secondary | ICD-10-CM | POA: Diagnosis not present

## 2020-12-11 DIAGNOSIS — H2512 Age-related nuclear cataract, left eye: Secondary | ICD-10-CM | POA: Diagnosis not present

## 2020-12-17 NOTE — Telephone Encounter (Signed)
Per DPR, left detail message for patient of Dr Cordelia Pen comments

## 2020-12-18 DIAGNOSIS — R0602 Shortness of breath: Secondary | ICD-10-CM | POA: Diagnosis not present

## 2020-12-27 DIAGNOSIS — K5903 Drug induced constipation: Secondary | ICD-10-CM | POA: Diagnosis not present

## 2020-12-27 DIAGNOSIS — Z79899 Other long term (current) drug therapy: Secondary | ICD-10-CM | POA: Diagnosis not present

## 2020-12-27 DIAGNOSIS — K589 Irritable bowel syndrome without diarrhea: Secondary | ICD-10-CM | POA: Diagnosis not present

## 2020-12-27 DIAGNOSIS — G894 Chronic pain syndrome: Secondary | ICD-10-CM | POA: Diagnosis not present

## 2020-12-27 DIAGNOSIS — E785 Hyperlipidemia, unspecified: Secondary | ICD-10-CM | POA: Diagnosis not present

## 2020-12-27 DIAGNOSIS — Z5181 Encounter for therapeutic drug level monitoring: Secondary | ICD-10-CM | POA: Diagnosis not present

## 2020-12-27 DIAGNOSIS — G629 Polyneuropathy, unspecified: Secondary | ICD-10-CM | POA: Diagnosis not present

## 2020-12-27 DIAGNOSIS — I10 Essential (primary) hypertension: Secondary | ICD-10-CM | POA: Diagnosis not present

## 2020-12-27 DIAGNOSIS — Z0000 Encounter for general adult medical examination without abnormal findings: Secondary | ICD-10-CM | POA: Diagnosis not present

## 2020-12-30 DIAGNOSIS — R269 Unspecified abnormalities of gait and mobility: Secondary | ICD-10-CM | POA: Diagnosis not present

## 2020-12-30 DIAGNOSIS — J449 Chronic obstructive pulmonary disease, unspecified: Secondary | ICD-10-CM | POA: Diagnosis not present

## 2021-01-13 DIAGNOSIS — R0600 Dyspnea, unspecified: Secondary | ICD-10-CM | POA: Diagnosis not present

## 2021-01-13 DIAGNOSIS — I517 Cardiomegaly: Secondary | ICD-10-CM | POA: Diagnosis not present

## 2021-01-23 DIAGNOSIS — J4540 Moderate persistent asthma, uncomplicated: Secondary | ICD-10-CM | POA: Diagnosis not present

## 2021-01-23 DIAGNOSIS — R059 Cough, unspecified: Secondary | ICD-10-CM | POA: Diagnosis not present

## 2021-01-23 DIAGNOSIS — Z8616 Personal history of COVID-19: Secondary | ICD-10-CM | POA: Diagnosis not present

## 2021-01-23 DIAGNOSIS — G4733 Obstructive sleep apnea (adult) (pediatric): Secondary | ICD-10-CM | POA: Diagnosis not present

## 2021-01-23 DIAGNOSIS — J301 Allergic rhinitis due to pollen: Secondary | ICD-10-CM | POA: Diagnosis not present

## 2021-02-05 ENCOUNTER — Encounter: Payer: Self-pay | Admitting: *Deleted

## 2021-02-05 ENCOUNTER — Other Ambulatory Visit: Payer: Self-pay | Admitting: *Deleted

## 2021-02-11 ENCOUNTER — Encounter: Payer: Self-pay | Admitting: Diagnostic Neuroimaging

## 2021-02-11 ENCOUNTER — Ambulatory Visit (INDEPENDENT_AMBULATORY_CARE_PROVIDER_SITE_OTHER): Payer: 59 | Admitting: Diagnostic Neuroimaging

## 2021-02-11 VITALS — BP 103/58 | HR 73 | Ht 64.0 in | Wt 257.2 lb

## 2021-02-11 DIAGNOSIS — G43109 Migraine with aura, not intractable, without status migrainosus: Secondary | ICD-10-CM

## 2021-02-11 MED ORDER — NURTEC 75 MG PO TBDP
75.0000 mg | ORAL_TABLET | Freq: Every day | ORAL | 6 refills | Status: AC | PRN
Start: 2021-02-11 — End: ?

## 2021-02-11 MED ORDER — EMGALITY 120 MG/ML ~~LOC~~ SOAJ: 120.0000 mg | SUBCUTANEOUS | 4 refills | Status: AC

## 2021-02-11 NOTE — Patient Instructions (Signed)
  MIGRAINE WITH AURA - continue emgality 120mg  monthly injection - continue nurtec 75mg  as needed for migraine rescue  SEIZURE DISORDER (last event Dec 2021) - stable on levetiracetam 1000mg  twice a day

## 2021-02-11 NOTE — Progress Notes (Signed)
[facility]  PATIENT: Jamie Fields DOB: 1956/07/26  REFERRING CLINICIAN: Jaclynn Major, NP HISTORY FROM: patient  REASON FOR VISIT: new consult    HISTORICAL  CHIEF COMPLAINT:  Chief Complaint  Patient presents with   Migraine    Rm 7 New Pt "on Emgality, Ubrelvy x 4 months, injection only lasts two weeks; average maybe 5 migraines a month; tried Sumatriptan w/o benefit; stopped Ibuprofen d/t kidney disease, Tylenol can cause stomach issues"    HISTORY OF PRESENT ILLNESS:   65 year old female here for evaluation of headaches.  Patient had headaches since teenage years with pressure and throbbing sensation, nausea, sensitive to light and sound.  Patient was having up to 25 days of headache per month but now doing better with Emgality and Nurtec.  She has tried sumatriptan in the past without benefit.  Also had to stop ibuprofen due to kidney disease.  Tylenol was causing some side effects as well.   REVIEW OF SYSTEMS: Full 14 system review of systems performed and negative with exception of: as per HPI.  ALLERGIES: No Known Allergies  HOME MEDICATIONS: Outpatient Medications Prior to Visit  Medication Sig Dispense Refill   albuterol (PROVENTIL) (2.5 MG/3ML) 0.083% nebulizer solution Take 2.5 mg by nebulization every 4 (four) hours as needed for wheezing or shortness of breath.      amitriptyline (ELAVIL) 50 MG tablet Take 50 mg by mouth at bedtime.     Ascorbic Acid (VITAMIN C) 100 MG tablet Take by mouth.     docusate calcium (SURFAK) 240 MG capsule Take by mouth.     esomeprazole (NEXIUM) 20 MG capsule Take 20 mg by mouth daily.     fluticasone (FLONASE) 50 MCG/ACT nasal spray Place 1 spray into both nostrils daily.     Galcanezumab-gnlm (EMGALITY) 120 MG/ML SOAJ Inject into the skin every 30 (thirty) days.     HYDROcodone-acetaminophen (NORCO/VICODIN) 5-325 MG tablet Take 1 tablet by mouth every 8 (eight) hours as needed. for pain      hydrOXYzine (ATARAX/VISTARIL) 25 MG tablet Take by mouth as needed.     ipratropium-albuterol (DUONEB) 0.5-2.5 (3) MG/3ML SOLN Take 3 mLs by nebulization every 2 (two) hours as needed. 360 mL    labetalol (NORMODYNE) 200 MG tablet Place 1 tablet (200 mg total) into feeding tube 3 (three) times daily.     levETIRAcetam (KEPPRA) 1000 MG tablet Take 1,000 mg by mouth 2 (two) times daily.     LINZESS 145 MCG CAPS capsule Take 145 mcg by mouth every morning.     loperamide (IMODIUM) 1 MG/5ML solution Take 20 mLs (4 mg total) by mouth as needed for diarrhea or loose stools. 120 mL 0   meclizine (ANTIVERT) 25 MG tablet Take 25 mg by mouth daily as needed.     montelukast (SINGULAIR) 10 MG tablet SMARTSIG:1 Tablet(s) By Mouth Every Evening     ondansetron (ZOFRAN) 8 MG tablet Take 8 mg by mouth 3 (three) times daily as needed for nausea or vomiting.     ondansetron (ZOFRAN-ODT) 4 MG disintegrating tablet Take by mouth every 8 (eight) hours as needed.     Potassium Chloride ER 20 MEQ TBCR Take 1 tablet by mouth daily.     pregabalin (LYRICA) 200 MG capsule Take by mouth 3 (three) times daily.     PROAIR HFA 108 (90 Base) MCG/ACT inhaler SMARTSIG:1 Puff(s) Via Inhaler Every 6 Hours PRN     psyllium (REGULOID) 0.52 g capsule Take by mouth.  torsemide (DEMADEX) 20 MG tablet Take by mouth daily.     TRELEGY ELLIPTA 100-62.5-25 MCG/INH AEPB SMARTSIG:1 Inhalation Via Inhaler Daily     UBRELVY 100 MG TABS Take 1 tablet by mouth daily as needed.     VIIBRYD 20 MG TABS Take 1 tablet by mouth daily.     VITAMIN D, CHOLECALCIFEROL, PO Take by mouth. 2000IU     acetaminophen (TYLENOL) 160 MG/5ML solution Take 20.3 mLs (650 mg total) by mouth every 6 (six) hours as needed for mild pain or fever. (Patient not taking: Reported on 02/11/2021) 120 mL 0   atorvastatin (LIPITOR) 10 MG tablet Take 10 mg by mouth at bedtime.  (Patient not taking: Reported on 02/11/2021)     diclofenac (VOLTAREN) 75 MG EC tablet Take 75 mg  by mouth 2 (two) times daily as needed (pain).  (Patient not taking: Reported on 02/11/2021)     insulin aspart (NOVOLOG) 100 UNIT/ML injection Inject 0-20 Units into the skin every 4 (four) hours. (Patient not taking: Reported on 02/11/2021) 10 mL 11   Nutritional Supplements (FEEDING SUPPLEMENT, VITAL AF 1.2 CAL,) LIQD 60ml/hr (Patient not taking: Reported on 02/11/2021)     pantoprazole sodium (PROTONIX) 40 mg/20 mL PACK Place 20 mLs (40 mg total) into feeding tube daily. (Patient not taking: Reported on 02/11/2021) 30 each    sertraline (ZOLOFT) 100 MG tablet Take 200 mg by mouth daily.  (Patient not taking: Reported on 02/11/2021)     SUMAtriptan (IMITREX) 50 MG tablet Take 50 mg by mouth every 2 (two) hours as needed for migraine. Do not exceed 200 mg in 24 hours (Patient not taking: Reported on 02/11/2021)     No facility-administered medications prior to visit.    PAST MEDICAL HISTORY: Past Medical History:  Diagnosis Date   Anemia    Anxiety    Arthritis    Asthma    CKD (chronic kidney disease), stage III (HCC)    Constipation    COPD (chronic obstructive pulmonary disease) (HCC)    Depression    Fibromyalgia    GERD (gastroesophageal reflux disease)    HTN (hypertension)    Hyperlipidemia    Hyperthyroidism    IBS (irritable bowel syndrome)    Migraines    OSA (obstructive sleep apnea)    Osteopenia    Peripheral neuropathy    Sepsis ([place]) 2016    PAST SURGICAL HISTORY: Past Surgical History:  Procedure Laterality Date   CESAREAN SECTION     CHOLECYSTECTOMY     GASTROSTOMY W/ FEEDING TUBE  2015   TRACHEOSTOMY     feinstein    FAMILY HISTORY: Family History  Problem Relation Age of Onset   Diabetes Mother    Hypertension Mother    Hyperlipidemia Mother    Emphysema Mother    Hypertension Father    Heart disease Father    Cancer Maternal Grandmother    Hypercalcemia Neg Hx     SOCIAL HISTORY: Social History   Socioeconomic History   Marital status:  Widowed    Spouse name: Not on file   Number of children: 1   Years of education: 9   Highest education level: Not on file  Occupational History   Not on file  Tobacco Use   Smoking status: Never   Smokeless tobacco: Never  Substance and Sexual Activity   Alcohol use: Never   Drug use: Never   Sexual activity: Not on file  Other Topics Concern   Not on file  Social History Narrative   Lives with brother   Caffeine- coffee   Social Determinants of Radio broadcast assistant Strain: Not on file  Food Insecurity: Not on file  Transportation Needs: Not on file  Physical Activity: Not on file  Stress: Not on file  Social Connections: Not on file  Intimate Partner Violence: Not on file     PHYSICAL EXAM  GENERAL EXAM/CONSTITUTIONAL: Vitals:  Vitals:   02/11/21 1435  BP: (!) 103/58  Pulse: 73  Weight: 257 lb 3.2 oz (116.7 kg)  Height: 5\' 4"  (1.626 m)   Body mass index is 44.15 kg/m. Wt Readings from Last 3 Encounters:  02/11/21 257 lb 3.2 oz (116.7 kg)  10/25/20 250 lb 6.4 oz (113.6 kg)  10/11/14 209 lb 14.1 oz (95.2 kg)   Patient is in no distress; well developed, nourished and groomed; neck is supple  CARDIOVASCULAR: Examination of carotid arteries is normal; no carotid bruits Regular rate and rhythm, no murmurs Examination of peripheral vascular system by observation and palpation is normal  EYES: Ophthalmoscopic exam of optic discs and posterior segments is normal; no papilledema or hemorrhages No results found.  MUSCULOSKELETAL: Gait, strength, tone, movements noted in Neurologic exam below  NEUROLOGIC: MENTAL STATUS:  No flowsheet data found. awake, alert, oriented to person, place and time recent and remote memory intact normal attention and concentration language fluent, comprehension intact, naming intact fund of knowledge appropriate  CRANIAL NERVE:  2nd - no papilledema on fundoscopic exam 2nd, 3rd, 4th, 6th - pupils equal and reactive  to light, visual fields full to confrontation, extraocular muscles intact, no nystagmus 5th - facial sensation symmetric 7th - facial strength symmetric 8th - hearing intact 9th - palate elevates symmetrically, uvula midline 11th - shoulder shrug symmetric 12th - tongue protrusion midline  MOTOR:  normal bulk and tone, full strength in the BUE, BLE  SENSORY:  normal and symmetric to light touch, temperature, vibration  COORDINATION:  finger-nose-finger, fine finger movements normal  REFLEXES:  deep tendon reflexes TRACE and symmetric  GAIT/STATION:  narrow based gait     DIAGNOSTIC DATA (LABS, IMAGING, TESTING) - I reviewed patient records, labs, notes, testing and imaging myself where available.  Lab Results  Component Value Date   WBC 5.5 10/11/2014   HGB 8.4 (L) 10/11/2014   HCT 26.3 (L) 10/11/2014   MCV 89.8 10/11/2014   PLT 306 10/11/2014      Component Value Date/Time   NA 137 10/11/2014 0443   K 4.0 10/11/2014 0443   CL 103 10/11/2014 0443   CO2 25 10/11/2014 0443   GLUCOSE 108 (H) 10/11/2014 0443   BUN 40 (H) 10/11/2014 0443   CREATININE 0.75 10/11/2014 0443   CALCIUM 11.2 (H) 10/25/2020 1030   PROT 6.5 10/25/2020 1027   ALBUMIN 2.0 (L) 10/04/2014 0648   AST 127 (H) 10/04/2014 0648   ALT 126 (H) 10/04/2014 0648   ALKPHOS 125 (H) 10/25/2020 1027   BILITOT 0.7 10/04/2014 0648   GFRNONAA >90 10/11/2014 0443   GFRAA >90 10/11/2014 0443   Lab Results  Component Value Date   TRIG 135 09/27/2014   No results found for: HGBA1C No results found for: [id number] Lab Results  Component Value Date   TSH 0.258 (L) 09/17/2014     09/30/14 MRI brain  1. Diffuse cortical T2 hyperintensity throughout both cerebral hemispheres, most suggestive of mild hypoxic injury secondary to recent cardiac arrest. No gyral swelling or mass effect. 2. Normal appearance of the  cerebellum and brainstem. 3. No acute infarct. - The patient was on a ventilator and receiving  supplementary oxygen during the MRI. Therefore, the abnormal FLAIR hyperintensity may be artifactual, and this is actually the favored explanation for the described appearance.     ASSESSMENT AND PLAN  65 y.o. year old female here with:  Dx:  1. Migraine with aura and without status migrainosus, not intractable     PLAN:  MIGRAINE WITH AURA (2-3 per month now; previously > 25 per month) - continue emgality 120mg  monthly injection - continue nurtec 75mg  as needed for migraine rescue  SEIZURE DISORDER (started in 2012; last event Dec 2021) - stable on levetiracetam 1000mg  twice a day   Meds ordered this encounter  Medications   Galcanezumab-gnlm (EMGALITY) 120 MG/ML SOAJ    Sig: Inject 120 mg into the skin every 30 (thirty) days.    Dispense:  3 mL    Refill:  4   Rimegepant Sulfate (NURTEC) 75 MG TBDP    Sig: Take 75 mg by mouth daily as needed.    Dispense:  8 tablet    Refill:  6   Return for return to PCP, pending if symptoms worsen or fail to improve.    Penni Bombard, MD 3/88/7195, 9:74 PM Certified in Neurology, Neurophysiology and Neuroimaging  [facility] [address], [place], [place] [address] [phone]

## 2021-02-12 ENCOUNTER — Encounter: Payer: Self-pay | Admitting: Diagnostic Neuroimaging

## 2021-03-06 DIAGNOSIS — G4733 Obstructive sleep apnea (adult) (pediatric): Secondary | ICD-10-CM | POA: Diagnosis not present

## 2021-03-06 DIAGNOSIS — J4540 Moderate persistent asthma, uncomplicated: Secondary | ICD-10-CM | POA: Diagnosis not present

## 2021-03-06 DIAGNOSIS — Z8616 Personal history of COVID-19: Secondary | ICD-10-CM | POA: Diagnosis not present

## 2021-03-06 DIAGNOSIS — R059 Cough, unspecified: Secondary | ICD-10-CM | POA: Diagnosis not present

## 2021-03-06 DIAGNOSIS — J301 Allergic rhinitis due to pollen: Secondary | ICD-10-CM | POA: Diagnosis not present

## 2021-03-13 DIAGNOSIS — H25812 Combined forms of age-related cataract, left eye: Secondary | ICD-10-CM | POA: Diagnosis not present

## 2021-03-13 DIAGNOSIS — H2512 Age-related nuclear cataract, left eye: Secondary | ICD-10-CM | POA: Diagnosis not present

## 2021-03-13 DIAGNOSIS — J4540 Moderate persistent asthma, uncomplicated: Secondary | ICD-10-CM | POA: Diagnosis not present

## 2021-03-17 DIAGNOSIS — R21 Rash and other nonspecific skin eruption: Secondary | ICD-10-CM | POA: Diagnosis not present

## 2021-03-17 DIAGNOSIS — L0292 Furuncle, unspecified: Secondary | ICD-10-CM | POA: Diagnosis not present

## 2021-03-19 DIAGNOSIS — R072 Precordial pain: Secondary | ICD-10-CM | POA: Diagnosis not present

## 2021-03-19 DIAGNOSIS — J90 Pleural effusion, not elsewhere classified: Secondary | ICD-10-CM | POA: Diagnosis not present

## 2021-03-19 DIAGNOSIS — R079 Chest pain, unspecified: Secondary | ICD-10-CM | POA: Diagnosis not present

## 2021-03-20 DIAGNOSIS — B372 Candidiasis of skin and nail: Secondary | ICD-10-CM | POA: Diagnosis not present

## 2021-03-20 DIAGNOSIS — G894 Chronic pain syndrome: Secondary | ICD-10-CM | POA: Diagnosis not present

## 2021-03-20 DIAGNOSIS — L0292 Furuncle, unspecified: Secondary | ICD-10-CM | POA: Diagnosis not present

## 2021-03-20 DIAGNOSIS — Z5181 Encounter for therapeutic drug level monitoring: Secondary | ICD-10-CM | POA: Diagnosis not present

## 2021-03-20 DIAGNOSIS — Z79899 Other long term (current) drug therapy: Secondary | ICD-10-CM | POA: Diagnosis not present

## 2021-03-20 DIAGNOSIS — R300 Dysuria: Secondary | ICD-10-CM | POA: Diagnosis not present

## 2021-03-25 DIAGNOSIS — I5189 Other ill-defined heart diseases: Secondary | ICD-10-CM | POA: Diagnosis not present

## 2021-03-25 DIAGNOSIS — R0600 Dyspnea, unspecified: Secondary | ICD-10-CM | POA: Diagnosis not present

## 2021-03-25 DIAGNOSIS — G4733 Obstructive sleep apnea (adult) (pediatric): Secondary | ICD-10-CM | POA: Diagnosis not present

## 2021-03-25 DIAGNOSIS — J4520 Mild intermittent asthma, uncomplicated: Secondary | ICD-10-CM | POA: Diagnosis not present

## 2021-03-25 DIAGNOSIS — R002 Palpitations: Secondary | ICD-10-CM | POA: Diagnosis not present

## 2021-03-26 DIAGNOSIS — E785 Hyperlipidemia, unspecified: Secondary | ICD-10-CM | POA: Diagnosis not present

## 2021-03-26 DIAGNOSIS — M8580 Other specified disorders of bone density and structure, unspecified site: Secondary | ICD-10-CM | POA: Diagnosis not present

## 2021-03-27 DIAGNOSIS — R002 Palpitations: Secondary | ICD-10-CM | POA: Diagnosis not present

## 2021-03-27 DIAGNOSIS — G894 Chronic pain syndrome: Secondary | ICD-10-CM | POA: Diagnosis not present

## 2021-03-27 DIAGNOSIS — G43909 Migraine, unspecified, not intractable, without status migrainosus: Secondary | ICD-10-CM | POA: Diagnosis not present

## 2021-03-27 DIAGNOSIS — J45909 Unspecified asthma, uncomplicated: Secondary | ICD-10-CM | POA: Diagnosis not present

## 2021-03-27 DIAGNOSIS — G4733 Obstructive sleep apnea (adult) (pediatric): Secondary | ICD-10-CM | POA: Diagnosis not present

## 2021-03-30 DIAGNOSIS — R002 Palpitations: Secondary | ICD-10-CM | POA: Diagnosis not present

## 2021-04-01 DIAGNOSIS — J449 Chronic obstructive pulmonary disease, unspecified: Secondary | ICD-10-CM | POA: Diagnosis not present

## 2021-04-01 DIAGNOSIS — R269 Unspecified abnormalities of gait and mobility: Secondary | ICD-10-CM | POA: Diagnosis not present

## 2021-04-10 DIAGNOSIS — G4733 Obstructive sleep apnea (adult) (pediatric): Secondary | ICD-10-CM | POA: Diagnosis not present

## 2021-04-10 DIAGNOSIS — R059 Cough, unspecified: Secondary | ICD-10-CM | POA: Diagnosis not present

## 2021-04-10 DIAGNOSIS — J301 Allergic rhinitis due to pollen: Secondary | ICD-10-CM | POA: Diagnosis not present

## 2021-04-10 DIAGNOSIS — J4540 Moderate persistent asthma, uncomplicated: Secondary | ICD-10-CM | POA: Diagnosis not present

## 2021-04-13 DIAGNOSIS — J4540 Moderate persistent asthma, uncomplicated: Secondary | ICD-10-CM | POA: Diagnosis not present

## 2021-04-14 DIAGNOSIS — G43109 Migraine with aura, not intractable, without status migrainosus: Secondary | ICD-10-CM | POA: Diagnosis not present

## 2021-04-14 DIAGNOSIS — H9201 Otalgia, right ear: Secondary | ICD-10-CM | POA: Diagnosis not present

## 2021-04-30 DIAGNOSIS — J4540 Moderate persistent asthma, uncomplicated: Secondary | ICD-10-CM | POA: Diagnosis not present

## 2021-05-01 ENCOUNTER — Other Ambulatory Visit: Payer: Self-pay

## 2021-05-01 ENCOUNTER — Ambulatory Visit (INDEPENDENT_AMBULATORY_CARE_PROVIDER_SITE_OTHER): Payer: Medicare Other | Admitting: Endocrinology

## 2021-05-01 VITALS — BP 114/70 | HR 74 | Ht 64.0 in | Wt 249.6 lb

## 2021-05-01 DIAGNOSIS — E213 Hyperparathyroidism, unspecified: Secondary | ICD-10-CM | POA: Diagnosis not present

## 2021-05-01 DIAGNOSIS — R7989 Other specified abnormal findings of blood chemistry: Secondary | ICD-10-CM | POA: Diagnosis not present

## 2021-05-01 DIAGNOSIS — R269 Unspecified abnormalities of gait and mobility: Secondary | ICD-10-CM | POA: Diagnosis not present

## 2021-05-01 DIAGNOSIS — J449 Chronic obstructive pulmonary disease, unspecified: Secondary | ICD-10-CM | POA: Diagnosis not present

## 2021-05-01 LAB — HEPATIC FUNCTION PANEL
ALT: 41 U/L — ABNORMAL HIGH (ref 0–35)
AST: 30 U/L (ref 0–37)
Albumin: 4.2 g/dL (ref 3.5–5.2)
Alkaline Phosphatase: 120 U/L — ABNORMAL HIGH (ref 39–117)
Bilirubin, Direct: 0.1 mg/dL (ref 0.0–0.3)
Total Bilirubin: 0.5 mg/dL (ref 0.2–1.2)
Total Protein: 6.9 g/dL (ref 6.0–8.3)

## 2021-05-01 LAB — VITAMIN D 25 HYDROXY (VIT D DEFICIENCY, FRACTURES): VITD: 54.74 ng/mL (ref 30.00–100.00)

## 2021-05-01 NOTE — Patient Instructions (Addendum)
Blood tests are requested for you today.  We'll let you know about the results.   These results will probably tell us that we still need to keep an eye on this.   Please come back for a follow-up appointment in 6 months.

## 2021-05-01 NOTE — Progress Notes (Signed)
Subjective:    Patient ID: Jamie Fields, female    DOB: [birth year], 65 y.o.   MRN: [id number]  HPI Pt returns for f/u of primary hyperparathyroidism (dx'ed 2022; she has never had urolithiasis; only bony fracture was left hand (2020); DEXA (2/22): worst T-score was -1.7 (RFN). she takes no medication for osteoporosis).  She takes Vitamin-D, 4000 units QAM.  Elev LFT were noted during 2016 ICU stay Past Medical History:  Diagnosis Date   Anemia    Anxiety    Arthritis    Asthma    CKD (chronic kidney disease), stage III ([place])    Constipation    COPD (chronic obstructive pulmonary disease) (HCC)    Depression    Fibromyalgia    GERD (gastroesophageal reflux disease)    HTN (hypertension)    Hyperlipidemia    Hyperthyroidism    IBS (irritable bowel syndrome)    Migraines    OSA (obstructive sleep apnea)    Osteopenia    Peripheral neuropathy    Sepsis ([place]) 2016    Past Surgical History:  Procedure Laterality Date   CESAREAN SECTION     CHOLECYSTECTOMY     GASTROSTOMY W/ FEEDING TUBE  2015   TRACHEOSTOMY     feinstein    Social History   Socioeconomic History   Marital status: Widowed    Spouse name: Not on file   Number of children: 1   Years of education: 9   Highest education level: Not on file  Occupational History   Not on file  Tobacco Use   Smoking status: Never   Smokeless tobacco: Never  Substance and Sexual Activity   Alcohol use: Never   Drug use: Never   Sexual activity: Not on file  Other Topics Concern   Not on file  Social History Narrative   Lives with brother   Caffeine- coffee   Social Determinants of Health   Financial Resource Strain: Not on file  Food Insecurity: Not on file  Transportation Needs: Not on file  Physical Activity: Not on file  Stress: Not on file  Social Connections: Not on file  Intimate Partner Violence: Not on file    Current Outpatient Medications on File Prior to Visit  Medication Sig Dispense Refill    albuterol (PROVENTIL) (2.5 MG/3ML) 0.083% nebulizer solution Take 2.5 mg by nebulization every 4 (four) hours as needed for wheezing or shortness of breath.      amitriptyline (ELAVIL) 50 MG tablet Take 50 mg by mouth at bedtime.     Ascorbic Acid (VITAMIN C) 100 MG tablet Take by mouth.     docusate calcium (SURFAK) 240 MG capsule Take by mouth.     esomeprazole (NEXIUM) 20 MG capsule Take 20 mg by mouth daily.     fluticasone (FLONASE) 50 MCG/ACT nasal spray Place 1 spray into both nostrils daily.     Galcanezumab-gnlm (EMGALITY) 120 MG/ML SOAJ Inject 120 mg into the skin every 30 (thirty) days. 3 mL 4   HYDROcodone-acetaminophen (NORCO/VICODIN) 5-325 MG tablet Take 1 tablet by mouth every 8 (eight) hours as needed. for pain     hydrOXYzine (ATARAX/VISTARIL) 25 MG tablet Take by mouth as needed.     ipratropium-albuterol (DUONEB) 0.5-2.5 (3) MG/3ML SOLN Take 3 mLs by nebulization every 2 (two) hours as needed. 360 mL    labetalol (NORMODYNE) 200 MG tablet Place 1 tablet (200 mg total) into feeding tube 3 (three) times daily.     levETIRAcetam (KEPPRA) 1000 MG  tablet Take 1,000 mg by mouth 2 (two) times daily.     LINZESS 145 MCG CAPS capsule Take 145 mcg by mouth every morning.     loperamide (IMODIUM) 1 MG/5ML solution Take 20 mLs (4 mg total) by mouth as needed for diarrhea or loose stools. 120 mL 0   meclizine (ANTIVERT) 25 MG tablet Take 25 mg by mouth daily as needed.     montelukast (SINGULAIR) 10 MG tablet SMARTSIG:1 Tablet(s) By Mouth Every Evening     ondansetron (ZOFRAN) 8 MG tablet Take 8 mg by mouth 3 (three) times daily as needed for nausea or vomiting.     ondansetron (ZOFRAN-ODT) 4 MG disintegrating tablet Take by mouth every 8 (eight) hours as needed.     Potassium Chloride ER 20 MEQ TBCR Take 1 tablet by mouth daily.     pregabalin (LYRICA) 200 MG capsule Take by mouth 3 (three) times daily.     PROAIR HFA 108 (90 Base) MCG/ACT inhaler SMARTSIG:1 Puff(s) Via Inhaler Every 6  Hours PRN     psyllium (REGULOID) 0.52 g capsule Take by mouth.     Rimegepant Sulfate (NURTEC) 75 MG TBDP Take 75 mg by mouth daily as needed. 8 tablet 6   torsemide (DEMADEX) 20 MG tablet Take by mouth daily.     TRELEGY ELLIPTA 100-62.5-25 MCG/INH AEPB SMARTSIG:1 Inhalation Via Inhaler Daily     VIIBRYD 20 MG TABS Take 1 tablet by mouth daily.     VITAMIN D, CHOLECALCIFEROL, PO Take by mouth. 2000IU     No current facility-administered medications on file prior to visit.    No Known Allergies  Family History  Problem Relation Age of Onset   Diabetes Mother    Hypertension Mother    Hyperlipidemia Mother    Emphysema Mother    Hypertension Father    Heart disease Father    Cancer Maternal Grandmother    Hypercalcemia Neg Hx     BP 114/70 (BP Location: Right Arm, Patient Position: Sitting, Cuff Size: Large)   Pulse 74   Ht 5\' 4"  (1.626 m)   Wt 249 lb 9.6 oz (113.2 kg)   SpO2 97%   BMI 42.84 kg/m    Review of Systems     Objective:   Physical Exam VITAL SIGNS:  See vs page GENERAL: no distress GAIT: steady, with a walker  Lab Results  Component Value Date   PTH 107 (H) 05/01/2021   CALCIUM 12.1 (H) 05/01/2021   PHOS 4.0 10/11/2014   25-OH Vit-D=58     Assessment & Plan:  Primary hyperparathyroidism: uncontrolled.  Ref surgery Elev LFT: recheck today Vit-D def: well-controlled. Please continue the same vit-D

## 2021-05-02 DIAGNOSIS — J4540 Moderate persistent asthma, uncomplicated: Secondary | ICD-10-CM | POA: Diagnosis not present

## 2021-05-02 LAB — PTH, INTACT AND CALCIUM
Calcium: 12.1 mg/dL — ABNORMAL HIGH (ref 8.6–10.4)
PTH: 107 pg/mL — ABNORMAL HIGH (ref 16–77)

## 2021-05-07 DIAGNOSIS — R0609 Other forms of dyspnea: Secondary | ICD-10-CM | POA: Diagnosis not present

## 2021-05-07 DIAGNOSIS — R002 Palpitations: Secondary | ICD-10-CM | POA: Diagnosis not present

## 2021-05-07 DIAGNOSIS — G4733 Obstructive sleep apnea (adult) (pediatric): Secondary | ICD-10-CM | POA: Diagnosis not present

## 2021-05-07 DIAGNOSIS — I5189 Other ill-defined heart diseases: Secondary | ICD-10-CM | POA: Diagnosis not present

## 2021-05-12 DIAGNOSIS — R109 Unspecified abdominal pain: Secondary | ICD-10-CM | POA: Diagnosis not present

## 2021-05-12 DIAGNOSIS — I5189 Other ill-defined heart diseases: Secondary | ICD-10-CM | POA: Diagnosis not present

## 2021-05-13 DIAGNOSIS — J4540 Moderate persistent asthma, uncomplicated: Secondary | ICD-10-CM | POA: Diagnosis not present

## 2021-05-15 DIAGNOSIS — J301 Allergic rhinitis due to pollen: Secondary | ICD-10-CM | POA: Diagnosis not present

## 2021-05-15 DIAGNOSIS — R059 Cough, unspecified: Secondary | ICD-10-CM | POA: Diagnosis not present

## 2021-05-15 DIAGNOSIS — G4733 Obstructive sleep apnea (adult) (pediatric): Secondary | ICD-10-CM | POA: Diagnosis not present

## 2021-05-15 DIAGNOSIS — J4540 Moderate persistent asthma, uncomplicated: Secondary | ICD-10-CM | POA: Diagnosis not present

## 2021-05-21 DIAGNOSIS — J45909 Unspecified asthma, uncomplicated: Secondary | ICD-10-CM | POA: Diagnosis not present

## 2021-05-21 DIAGNOSIS — G894 Chronic pain syndrome: Secondary | ICD-10-CM | POA: Diagnosis not present

## 2021-05-21 DIAGNOSIS — G4733 Obstructive sleep apnea (adult) (pediatric): Secondary | ICD-10-CM | POA: Diagnosis not present

## 2021-05-21 DIAGNOSIS — G43909 Migraine, unspecified, not intractable, without status migrainosus: Secondary | ICD-10-CM | POA: Diagnosis not present

## 2021-06-01 DIAGNOSIS — R269 Unspecified abnormalities of gait and mobility: Secondary | ICD-10-CM | POA: Diagnosis not present

## 2021-06-01 DIAGNOSIS — J449 Chronic obstructive pulmonary disease, unspecified: Secondary | ICD-10-CM | POA: Diagnosis not present

## 2021-06-02 DIAGNOSIS — R1310 Dysphagia, unspecified: Secondary | ICD-10-CM | POA: Diagnosis not present

## 2021-06-13 DIAGNOSIS — J4540 Moderate persistent asthma, uncomplicated: Secondary | ICD-10-CM | POA: Diagnosis not present

## 2021-06-17 DIAGNOSIS — R300 Dysuria: Secondary | ICD-10-CM | POA: Diagnosis not present

## 2021-06-17 DIAGNOSIS — Z79899 Other long term (current) drug therapy: Secondary | ICD-10-CM | POA: Diagnosis not present

## 2021-06-17 DIAGNOSIS — R109 Unspecified abdominal pain: Secondary | ICD-10-CM | POA: Diagnosis not present

## 2021-06-17 DIAGNOSIS — R609 Edema, unspecified: Secondary | ICD-10-CM | POA: Diagnosis not present

## 2021-06-17 DIAGNOSIS — Z5181 Encounter for therapeutic drug level monitoring: Secondary | ICD-10-CM | POA: Diagnosis not present

## 2021-06-17 DIAGNOSIS — R748 Abnormal levels of other serum enzymes: Secondary | ICD-10-CM | POA: Diagnosis not present

## 2021-06-17 DIAGNOSIS — G894 Chronic pain syndrome: Secondary | ICD-10-CM | POA: Diagnosis not present

## 2021-06-18 DIAGNOSIS — R0602 Shortness of breath: Secondary | ICD-10-CM | POA: Diagnosis not present

## 2021-06-18 DIAGNOSIS — R002 Palpitations: Secondary | ICD-10-CM | POA: Diagnosis not present

## 2021-06-18 DIAGNOSIS — R0609 Other forms of dyspnea: Secondary | ICD-10-CM | POA: Diagnosis not present

## 2021-06-18 DIAGNOSIS — I5189 Other ill-defined heart diseases: Secondary | ICD-10-CM | POA: Diagnosis not present

## 2021-06-18 DIAGNOSIS — G4733 Obstructive sleep apnea (adult) (pediatric): Secondary | ICD-10-CM | POA: Diagnosis not present

## 2021-06-21 DIAGNOSIS — G43909 Migraine, unspecified, not intractable, without status migrainosus: Secondary | ICD-10-CM | POA: Diagnosis not present

## 2021-06-21 DIAGNOSIS — J45909 Unspecified asthma, uncomplicated: Secondary | ICD-10-CM | POA: Diagnosis not present

## 2021-06-21 DIAGNOSIS — G4733 Obstructive sleep apnea (adult) (pediatric): Secondary | ICD-10-CM | POA: Diagnosis not present

## 2021-06-21 DIAGNOSIS — G894 Chronic pain syndrome: Secondary | ICD-10-CM | POA: Diagnosis not present

## 2021-06-23 DIAGNOSIS — D500 Iron deficiency anemia secondary to blood loss (chronic): Secondary | ICD-10-CM | POA: Diagnosis not present

## 2021-06-23 DIAGNOSIS — R002 Palpitations: Secondary | ICD-10-CM | POA: Diagnosis not present

## 2021-06-23 DIAGNOSIS — R0602 Shortness of breath: Secondary | ICD-10-CM | POA: Diagnosis not present

## 2021-06-24 DIAGNOSIS — R197 Diarrhea, unspecified: Secondary | ICD-10-CM | POA: Diagnosis not present

## 2021-06-24 DIAGNOSIS — K5904 Chronic idiopathic constipation: Secondary | ICD-10-CM | POA: Diagnosis not present

## 2021-06-24 DIAGNOSIS — D500 Iron deficiency anemia secondary to blood loss (chronic): Secondary | ICD-10-CM | POA: Diagnosis not present

## 2021-07-01 DIAGNOSIS — R269 Unspecified abnormalities of gait and mobility: Secondary | ICD-10-CM | POA: Diagnosis not present

## 2021-07-01 DIAGNOSIS — J449 Chronic obstructive pulmonary disease, unspecified: Secondary | ICD-10-CM | POA: Diagnosis not present

## 2021-07-02 DIAGNOSIS — R1310 Dysphagia, unspecified: Secondary | ICD-10-CM | POA: Diagnosis not present

## 2021-07-13 DIAGNOSIS — J4540 Moderate persistent asthma, uncomplicated: Secondary | ICD-10-CM | POA: Diagnosis not present

## 2021-07-17 DIAGNOSIS — J069 Acute upper respiratory infection, unspecified: Secondary | ICD-10-CM | POA: Diagnosis not present

## 2021-07-17 DIAGNOSIS — J301 Allergic rhinitis due to pollen: Secondary | ICD-10-CM | POA: Diagnosis not present

## 2021-07-17 DIAGNOSIS — J4540 Moderate persistent asthma, uncomplicated: Secondary | ICD-10-CM | POA: Diagnosis not present

## 2021-07-17 DIAGNOSIS — G4733 Obstructive sleep apnea (adult) (pediatric): Secondary | ICD-10-CM | POA: Diagnosis not present

## 2021-07-17 DIAGNOSIS — R059 Cough, unspecified: Secondary | ICD-10-CM | POA: Diagnosis not present

## 2021-07-21 DIAGNOSIS — G43909 Migraine, unspecified, not intractable, without status migrainosus: Secondary | ICD-10-CM | POA: Diagnosis not present

## 2021-07-21 DIAGNOSIS — G4733 Obstructive sleep apnea (adult) (pediatric): Secondary | ICD-10-CM | POA: Diagnosis not present

## 2021-07-21 DIAGNOSIS — J45909 Unspecified asthma, uncomplicated: Secondary | ICD-10-CM | POA: Diagnosis not present

## 2021-07-21 DIAGNOSIS — G894 Chronic pain syndrome: Secondary | ICD-10-CM | POA: Diagnosis not present

## 2021-07-23 DIAGNOSIS — G4733 Obstructive sleep apnea (adult) (pediatric): Secondary | ICD-10-CM | POA: Diagnosis not present

## 2021-07-29 DIAGNOSIS — M25512 Pain in left shoulder: Secondary | ICD-10-CM | POA: Diagnosis not present

## 2021-07-29 DIAGNOSIS — M5410 Radiculopathy, site unspecified: Secondary | ICD-10-CM | POA: Diagnosis not present

## 2021-07-29 DIAGNOSIS — I5189 Other ill-defined heart diseases: Secondary | ICD-10-CM | POA: Diagnosis not present

## 2021-07-29 DIAGNOSIS — G4733 Obstructive sleep apnea (adult) (pediatric): Secondary | ICD-10-CM | POA: Diagnosis not present

## 2021-07-29 DIAGNOSIS — M5090 Cervical disc disorder, unspecified, unspecified cervical region: Secondary | ICD-10-CM | POA: Diagnosis not present

## 2021-07-29 DIAGNOSIS — M25511 Pain in right shoulder: Secondary | ICD-10-CM | POA: Diagnosis not present

## 2021-07-29 DIAGNOSIS — G8929 Other chronic pain: Secondary | ICD-10-CM | POA: Diagnosis not present

## 2021-07-29 DIAGNOSIS — R0609 Other forms of dyspnea: Secondary | ICD-10-CM | POA: Diagnosis not present

## 2021-07-31 DIAGNOSIS — R1310 Dysphagia, unspecified: Secondary | ICD-10-CM | POA: Diagnosis not present

## 2021-08-01 DIAGNOSIS — R269 Unspecified abnormalities of gait and mobility: Secondary | ICD-10-CM | POA: Diagnosis not present

## 2021-08-01 DIAGNOSIS — J449 Chronic obstructive pulmonary disease, unspecified: Secondary | ICD-10-CM | POA: Diagnosis not present

## 2021-08-11 DIAGNOSIS — M25512 Pain in left shoulder: Secondary | ICD-10-CM | POA: Diagnosis not present

## 2021-08-11 DIAGNOSIS — M5011 Cervical disc disorder with radiculopathy,  high cervical region: Secondary | ICD-10-CM | POA: Diagnosis not present

## 2021-08-11 DIAGNOSIS — G8929 Other chronic pain: Secondary | ICD-10-CM | POA: Diagnosis not present

## 2021-08-11 DIAGNOSIS — M4802 Spinal stenosis, cervical region: Secondary | ICD-10-CM | POA: Diagnosis not present

## 2021-08-11 DIAGNOSIS — I5189 Other ill-defined heart diseases: Secondary | ICD-10-CM | POA: Diagnosis not present

## 2021-08-11 DIAGNOSIS — M4722 Other spondylosis with radiculopathy, cervical region: Secondary | ICD-10-CM | POA: Diagnosis not present

## 2021-08-11 DIAGNOSIS — M5410 Radiculopathy, site unspecified: Secondary | ICD-10-CM | POA: Diagnosis not present

## 2021-08-11 DIAGNOSIS — M25511 Pain in right shoulder: Secondary | ICD-10-CM | POA: Diagnosis not present

## 2021-08-13 ENCOUNTER — Telehealth: Payer: Self-pay | Admitting: Endocrinology

## 2021-08-13 DIAGNOSIS — J4540 Moderate persistent asthma, uncomplicated: Secondary | ICD-10-CM | POA: Diagnosis not present

## 2021-08-13 NOTE — Telephone Encounter (Signed)
Patient called requesting the name of surgeon that was referred by the Provider. She has not received any calls from the surgeon. Please call patient and provide information [phone].

## 2021-08-14 DIAGNOSIS — R29898 Other symptoms and signs involving the musculoskeletal system: Secondary | ICD-10-CM | POA: Diagnosis not present

## 2021-08-14 DIAGNOSIS — R202 Paresthesia of skin: Secondary | ICD-10-CM | POA: Diagnosis not present

## 2021-08-14 DIAGNOSIS — E213 Hyperparathyroidism, unspecified: Secondary | ICD-10-CM | POA: Diagnosis not present

## 2021-08-14 DIAGNOSIS — R200 Anesthesia of skin: Secondary | ICD-10-CM | POA: Diagnosis not present

## 2021-08-14 DIAGNOSIS — R937 Abnormal findings on diagnostic imaging of other parts of musculoskeletal system: Secondary | ICD-10-CM | POA: Diagnosis not present

## 2021-08-18 DIAGNOSIS — Z79899 Other long term (current) drug therapy: Secondary | ICD-10-CM | POA: Diagnosis not present

## 2021-08-18 DIAGNOSIS — R479 Unspecified speech disturbances: Secondary | ICD-10-CM | POA: Diagnosis not present

## 2021-08-18 DIAGNOSIS — R9431 Abnormal electrocardiogram [ECG] [EKG]: Secondary | ICD-10-CM | POA: Diagnosis not present

## 2021-08-18 DIAGNOSIS — R471 Dysarthria and anarthria: Secondary | ICD-10-CM | POA: Diagnosis not present

## 2021-08-18 DIAGNOSIS — I6782 Cerebral ischemia: Secondary | ICD-10-CM | POA: Diagnosis not present

## 2021-08-18 DIAGNOSIS — Z7951 Long term (current) use of inhaled steroids: Secondary | ICD-10-CM | POA: Diagnosis not present

## 2021-08-18 DIAGNOSIS — G319 Degenerative disease of nervous system, unspecified: Secondary | ICD-10-CM | POA: Diagnosis not present

## 2021-08-18 DIAGNOSIS — I10 Essential (primary) hypertension: Secondary | ICD-10-CM | POA: Diagnosis not present

## 2021-08-18 DIAGNOSIS — K219 Gastro-esophageal reflux disease without esophagitis: Secondary | ICD-10-CM | POA: Diagnosis not present

## 2021-08-18 DIAGNOSIS — Z792 Long term (current) use of antibiotics: Secondary | ICD-10-CM | POA: Diagnosis not present

## 2021-08-20 NOTE — Telephone Encounter (Signed)
Spoke with pt to let her know that there was no specific surgeon placed on her referral so it would probable be [facility] and provided the # to contact

## 2021-08-21 DIAGNOSIS — G4733 Obstructive sleep apnea (adult) (pediatric): Secondary | ICD-10-CM | POA: Diagnosis not present

## 2021-08-21 DIAGNOSIS — G43909 Migraine, unspecified, not intractable, without status migrainosus: Secondary | ICD-10-CM | POA: Diagnosis not present

## 2021-08-21 DIAGNOSIS — G894 Chronic pain syndrome: Secondary | ICD-10-CM | POA: Diagnosis not present

## 2021-08-21 DIAGNOSIS — J45909 Unspecified asthma, uncomplicated: Secondary | ICD-10-CM | POA: Diagnosis not present

## 2021-09-17 ENCOUNTER — Other Ambulatory Visit (HOSPITAL_COMMUNITY): Payer: Self-pay | Admitting: General Surgery

## 2021-09-17 ENCOUNTER — Other Ambulatory Visit: Payer: Self-pay | Admitting: General Surgery

## 2021-09-17 DIAGNOSIS — E210 Primary hyperparathyroidism: Secondary | ICD-10-CM

## 2021-09-26 ENCOUNTER — Encounter (HOSPITAL_COMMUNITY): Payer: 59

## 2021-10-02 ENCOUNTER — Encounter (HOSPITAL_COMMUNITY)
Admission: RE | Admit: 2021-10-02 | Discharge: 2021-10-02 | Disposition: A | Discharge status: Home / Self Care | Payer: 59 | Source: Ambulatory Visit | Attending: General Surgery | Admitting: General Surgery

## 2021-10-02 ENCOUNTER — Other Ambulatory Visit: Payer: Self-pay

## 2021-10-02 DIAGNOSIS — E210 Primary hyperparathyroidism: Secondary | ICD-10-CM | POA: Diagnosis present

## 2021-10-02 MED ORDER — TECHNETIUM TC 99M SESTAMIBI GENERIC - CARDIOLITE
25.2000 | Freq: Once | INTRAVENOUS | Status: AC | PRN
  Administered 2021-10-02: 11:00:00 25.2 via INTRAVENOUS

## 2021-10-14 ENCOUNTER — Other Ambulatory Visit: Payer: Self-pay | Admitting: General Surgery

## 2021-10-14 DIAGNOSIS — E210 Primary hyperparathyroidism: Secondary | ICD-10-CM

## 2021-10-30 ENCOUNTER — Encounter: Payer: Self-pay | Admitting: Endocrinology

## 2021-10-30 ENCOUNTER — Ambulatory Visit (INDEPENDENT_AMBULATORY_CARE_PROVIDER_SITE_OTHER): Payer: 59 | Admitting: Endocrinology

## 2021-10-30 VITALS — BP 110/70 | Ht 64.0 in | Wt 252.0 lb

## 2021-10-30 DIAGNOSIS — E213 Hyperparathyroidism, unspecified: Secondary | ICD-10-CM

## 2021-10-30 LAB — VITAMIN D 25 HYDROXY (VIT D DEFICIENCY, FRACTURES): VITD: 58.26 ng/mL (ref 30.00–100.00)

## 2021-10-30 NOTE — Patient Instructions (Addendum)
Blood tests are requested for you today.  We'll let you know about the results.   ?You should have a follow-up endocrinology appointment after the surgery.   ? ? ?

## 2021-10-30 NOTE — Progress Notes (Signed)
? ?Subjective:  ? ? Patient ID: Jamie Fields, female    DOB: 1955/08/31, 66 y.o.   MRN: [id number] ? ?HPI ?Pt returns for f/u of primary hyperparathyroidism (dx'ed 2022; she has never had urolithiasis; only bony fracture was left hand (2020); DEXA (2/22): worst T-score was -1.7 (RFN). she takes no medication for osteoporosis).  She takes Vitamin-D, 5 mcg QD.  Parathyroid surgery is planned.  Denies numbness and muscle cramps.   ?Past Medical History:  ?Diagnosis Date  ? Anemia   ? Anxiety   ? Arthritis   ? Asthma   ? CKD (chronic kidney disease), stage III ([place])   ? Constipation   ? COPD (chronic obstructive pulmonary disease) ([place])   ? Depression   ? Fibromyalgia   ? GERD (gastroesophageal reflux disease)   ? HTN (hypertension)   ? Hyperlipidemia   ? Hyperthyroidism   ? IBS (irritable bowel syndrome)   ? Migraines   ? OSA (obstructive sleep apnea)   ? Osteopenia   ? Peripheral neuropathy   ? Sepsis ([place]) 2016  ? ? ?Past Surgical History:  ?Procedure Laterality Date  ? CESAREAN SECTION    ? CHOLECYSTECTOMY    ? GASTROSTOMY W/ FEEDING TUBE  2015  ? TRACHEOSTOMY    ? feinstein  ? ? ?Social History  ? ?Socioeconomic History  ? Marital status: Widowed  ?  Spouse name: Not on file  ? Number of children: 1  ? Years of education: 53  ? Highest education level: Not on file  ?Occupational History  ? Not on file  ?Tobacco Use  ? Smoking status: Never  ? Smokeless tobacco: Never  ?Substance and Sexual Activity  ? Alcohol use: Never  ? Drug use: Never  ? Sexual activity: Not on file  ?Other Topics Concern  ? Not on file  ?Social History Narrative  ? Lives with brother  ? Caffeine- coffee  ? ?Social Determinants of Health  ? ?Financial Resource Strain: Not on file  ?Food Insecurity: Not on file  ?Transportation Needs: Not on file  ?Physical Activity: Not on file  ?Stress: Not on file  ?Social Connections: Not on file  ?Intimate Partner Violence: Not on file  ? ? ?Current Outpatient Medications on File Prior to Visit   ?Medication Sig Dispense Refill  ? albuterol (PROVENTIL) (2.5 MG/3ML) 0.083% nebulizer solution Take 2.5 mg by nebulization every 4 (four) hours as needed for wheezing or shortness of breath.     ? amitriptyline (ELAVIL) 50 MG tablet Take 50 mg by mouth at bedtime.    ? Ascorbic Acid (VITAMIN C) 100 MG tablet Take by mouth.    ? carvedilol (COREG) 3.125 MG tablet Take 3.125 mg by mouth in the morning and at bedtime.    ? dapagliflozin propanediol (FARXIGA) 10 MG TABS tablet Take by mouth daily.    ? docusate calcium (SURFAK) 240 MG capsule Take by mouth.    ? esomeprazole (NEXIUM) 20 MG capsule Take 20 mg by mouth daily.    ? fluticasone (FLONASE) 50 MCG/ACT nasal spray Place 1 spray into both nostrils daily.    ? Galcanezumab-gnlm (EMGALITY) 120 MG/ML SOAJ Inject 120 mg into the skin every 30 (thirty) days. 3 mL 4  ? HYDROcodone-acetaminophen (NORCO/VICODIN) 5-325 MG tablet Take 1 tablet by mouth every 8 (eight) hours as needed. for pain    ? hydrOXYzine (ATARAX/VISTARIL) 25 MG tablet Take by mouth as needed.    ? ipratropium-albuterol (DUONEB) 0.5-2.5 (3) MG/3ML SOLN Take 3 mLs  by nebulization every 2 (two) hours as needed. 360 mL   ? labetalol (NORMODYNE) 200 MG tablet Place 1 tablet (200 mg total) into feeding tube 3 (three) times daily.    ? levETIRAcetam (KEPPRA) 1000 MG tablet Take 1,000 mg by mouth 2 (two) times daily.    ? LINZESS 145 MCG CAPS capsule Take 145 mcg by mouth every morning.    ? loperamide (IMODIUM) 1 MG/5ML solution Take 20 mLs (4 mg total) by mouth as needed for diarrhea or loose stools. 120 mL 0  ? meclizine (ANTIVERT) 25 MG tablet Take 25 mg by mouth daily as needed.    ? montelukast (SINGULAIR) 10 MG tablet SMARTSIG:1 Tablet(s) By Mouth Every Evening    ? ondansetron (ZOFRAN) 8 MG tablet Take 8 mg by mouth 3 (three) times daily as needed for nausea or vomiting.    ? ondansetron (ZOFRAN-ODT) 4 MG disintegrating tablet Take by mouth every 8 (eight) hours as needed.    ? Potassium Chloride  ER 20 MEQ TBCR Take 1 tablet by mouth daily.    ? pregabalin (LYRICA) 200 MG capsule Take by mouth 3 (three) times daily.    ? PROAIR HFA 108 (90 Base) MCG/ACT inhaler SMARTSIG:1 Puff(s) Via Inhaler Every 6 Hours PRN    ? psyllium (REGULOID) 0.52 g capsule Take by mouth.    ? Rimegepant Sulfate (NURTEC) 75 MG TBDP Take 75 mg by mouth daily as needed. 8 tablet 6  ? torsemide (DEMADEX) 20 MG tablet Take 40 mg by mouth daily.    ? TRELEGY ELLIPTA 100-62.5-25 MCG/INH AEPB SMARTSIG:1 Inhalation Via Inhaler Daily    ? VIIBRYD 20 MG TABS Take 1 tablet by mouth daily.    ? VITAMIN D, CHOLECALCIFEROL, PO Take by mouth. 2000IU    ? ?No current facility-administered medications on file prior to visit.  ? ? ?Allergies  ?Allergen Reactions  ? Acetaminophen Diarrhea  ?  Pain   ? ? ?Family History  ?Problem Relation Age of Onset  ? Diabetes Mother   ? Hypertension Mother   ? Hyperlipidemia Mother   ? Emphysema Mother   ? Hypertension Father   ? Heart disease Father   ? Cancer Maternal Grandmother   ? Hypercalcemia Neg Hx   ? ? ?BP 110/70   Ht '5\' 4"'$  (1.626 m)   Wt 252 lb (114.3 kg)   BMI 43.26 kg/m?  ? ? ?Review of Systems ? ?   ?Objective:  ? Physical Exam ?VITAL SIGNS:  See vs page.   ?GENERAL: no distress.   ? ?Lab Results  ?Component Value Date  ? PTH 132 (H) 10/30/2021  ? CALCIUM 11.5 (H) 10/30/2021  ? PHOS 4.0 10/11/2014  ? ? ?25-OH Vit-D=58 ?   ?Assessment & Plan:  ?Vit-D def: well-controlled.  Please continue the same Vit-D supplement ?primary hyperparathyroidism: uncontrolled.  You should have a follow-up endocrinology appointment after the surgery.  ? ? ?

## 2021-10-31 LAB — PTH, INTACT AND CALCIUM
Calcium: 11.5 mg/dL — ABNORMAL HIGH (ref 8.6–10.4)
PTH: 132 pg/mL — ABNORMAL HIGH (ref 16–77)

## 2021-11-11 ENCOUNTER — Inpatient Hospital Stay: Admission: RE | Admit: 2021-11-11 | Payer: 59 | Source: Ambulatory Visit

## 2021-12-04 ENCOUNTER — Ambulatory Visit
Admission: RE | Admit: 2021-12-04 | Discharge: 2021-12-04 | Disposition: A | Discharge status: Home / Self Care | Payer: 59 | Source: Ambulatory Visit | Attending: General Surgery | Admitting: General Surgery

## 2021-12-04 DIAGNOSIS — E210 Primary hyperparathyroidism: Secondary | ICD-10-CM

## 2021-12-04 MED ORDER — IOPAMIDOL (ISOVUE-370) INJECTION 76%
60.0000 mL | Freq: Once | INTRAVENOUS | Status: AC | PRN
  Administered 2021-12-04: 60 mL via INTRAVENOUS

## 2021-12-08 ENCOUNTER — Ambulatory Visit: Payer: Self-pay | Admitting: General Surgery

## 2021-12-08 NOTE — H&P (Signed)
?Chief Complaint: thyroid ?  ?  ?History of Present Illness: ?Jamie Fields is a 66 y.o. female who is seen today as an office consultation at the request of Dr. Loanne Drilling for evaluation of thyroid ? .   ?  ?Patient is a 66 year old female who comes in with a history of anemia, COPD, GERD, history of seizures, and a diagnosis of primary hyperparathyroidism. ?Patient was previously worked up by Dr. Loanne Drilling which revealed elevated calciums.  In looking back at her record she had calciums of 11.2 and 12.1 back in April and October 2022 respectively.  Patient also with an elevated PTH level.  PTH levels were 97 in April and 107 and October.  Patient has not had any imaging studies for parathyroid glands. ?  ?Patient denies any history of kidney stones, pathological fractures, muscle cramping, fatigue or GI changes. ?  ?  ?   ?Review of Systems: ?A complete review of systems was obtained from the patient.  I have reviewed this information and discussed as appropriate with the patient.  See HPI as well for other ROS. ?  ?Review of Systems  ?Constitutional: Negative for fever.  ?HENT: Negative for congestion.   ?Eyes: Negative for blurred vision.  ?Respiratory: Negative for cough, shortness of breath and wheezing.   ?Cardiovascular: Negative for chest pain and palpitations.  ?Gastrointestinal: Negative for heartburn.  ?Genitourinary: Negative for dysuria.  ?Musculoskeletal: Negative for myalgias.  ?Skin: Negative for rash.  ?Neurological: Negative for dizziness and headaches.  ?Psychiatric/Behavioral: Negative for depression and suicidal ideas.  ?All other systems reviewed and are negative. ?  ?  ?Medical History: ?Past Medical History ?Past Medical History: ?Diagnosis Date ? Anemia   ? Anxiety   ? Arthritis   ? Asthma, unspecified asthma severity, unspecified whether complicated, unspecified whether persistent   ? Chronic kidney disease   ? COPD (chronic obstructive pulmonary disease) (CMS-HCC)   ? GERD  (gastroesophageal reflux disease)   ? Hyperlipidemia   ? Seizures (CMS-HCC)   ? Sleep apnea   ? Thyroid disease   ? ?  ?  ?There is no problem list on file for this patient. ?  ?  ?Past Surgical History ?Past Surgical History: ?Procedure Laterality Date ? bone spur surgery     ? cataract surgery N/A   ? CESAREAN SECTION     ? TONSILLECTOMY     ? TRACHEOSTOMY     ? ?  ?  ?Allergies ?No Known Allergies ? ?  ?Current Outpatient Medications on File Prior to Visit ?Medication Sig Dispense Refill ? amitriptyline (ELAVIL) 50 MG tablet Take by mouth     ? esomeprazole (NEXIUM) 20 MG DR capsule Take by mouth     ? fluticasone propionate (FLONASE) 50 mcg/actuation nasal spray one spray by Both Nostrils route daily.     ? galcanezumab-gnlm (EMGALITY PEN) 120 mg/mL PnIj Inject subcutaneously     ? HYDROcodone-acetaminophen (NORCO) 5-325 mg tablet Take by mouth every 8 (eight) hours as needed     ? levETIRAcetam (KEPPRA) 1000 MG tablet Take by mouth     ? montelukast (SINGULAIR) 10 mg tablet Take by mouth     ? rimegepant (NURTEC ODT) 75 mg disintegrating tablet Take by mouth     ? TORsemide (DEMADEX) 20 MG tablet Take by mouth     ? albuterol (PROVENTIL) 2.5 mg /3 mL (0.083 %) nebulizer solution every 6 (six) hours as needed     ? ascorbic acid, vitamin C, (VITAMIN C) 100 MG  tablet Take by mouth     ? docusate calcium (SURFAK) 240 mg capsule Take by mouth     ? hydrOXYzine (ATARAX) 25 MG tablet Take by mouth 2 (two) times daily as needed     ? ipratropium-albuteroL (DUO-NEB) nebulizer solution INHALE THE CONTENTS OF 1 VIAL VIA NEBULIZER FOUR TIMES A DAY AS NEEDED     ? labetaloL (TRANDATE) 200 MG tablet       ? LINZESS 145 mcg capsule       ? meclizine (ANTIVERT) 25 mg tablet       ? ondansetron (ZOFRAN-ODT) 4 MG disintegrating tablet       ? potassium chloride (KLOR-CON) 20 MEQ ER tablet Take 20 mEq by mouth once daily WITH FOOD     ? pregabalin (LYRICA) 200 MG capsule Take by mouth     ? vilazodone (VIIBRYD) 20 mg tablet        ?  ?No current facility-administered medications on file prior to visit. ?  ?  ?Family History ?Family History ?Problem Relation Age of Onset ? High blood pressure (Hypertension) Mother   ? Diabetes Mother   ? Heart valve disease Father   ? Skin cancer Brother   ? Heart valve disease Brother   ? ?  ?  ?Social History ?  ?Tobacco Use ?Smoking Status Never ?Smokeless Tobacco Never ?  ?  ?Social History ?Social History ?  ? ?Socioeconomic History ? Marital status: Widowed ?Tobacco Use ? Smoking status: Never ? Smokeless tobacco: Never ?Vaping Use ? Vaping Use: Never used ?Substance and Sexual Activity ? Alcohol use: Never ? Drug use: Never ? ?  ?  ?Objective: ?  ?Vitals: ?  09/11/21 1044 ?BP: 138/70 ?Pulse: 95 ?Temp: 36.7 ?C (98.1 ?F) ?SpO2: 96% ?Weight: (!) 129.5 kg (285 lb 9.6 oz) ?Height: 162.6 cm ('5\' 4"'$ ) ?  ?Body mass index is 49.02 kg/m?. ?  ?Physical Exam ?Constitutional:   ?   Appearance: Normal appearance.  ?HENT:  ?   Head: Normocephalic and atraumatic.  ?   Mouth/Throat:  ?   Mouth: Mucous membranes are moist.  ?   Pharynx: Oropharynx is clear.  ?Eyes:  ?   General: No scleral icterus. ?   Pupils: Pupils are equal, round, and reactive to light.  ?Neck:  ?   Comments: No palp masses ?Cardiovascular:  ?   Rate and Rhythm: Normal rate and regular rhythm.  ?   Pulses: Normal pulses.  ?   Heart sounds: No murmur heard. ?  No friction rub. No gallop.  ?Pulmonary:  ?   Effort: Pulmonary effort is normal. No respiratory distress.  ?   Breath sounds: Normal breath sounds. No stridor.  ?Abdominal:  ?   General: Abdomen is flat.  ?Musculoskeletal:     ?   General: No swelling.  ?Skin: ?   General: Skin is warm.  ?Neurological:  ?   General: No focal deficit present.  ?   Mental Status: She is alert and oriented to person, place, and time. Mental status is at baseline.  ?Psychiatric:     ?   Mood and Affect: Mood normal.     ?   Thought Content: Thought content normal.     ?   Judgment: Judgment normal.  ?  ?  ?  ?  ?   ?Assessment and Plan: ?   ?Diagnoses and all orders for this visit: ?  ?Primary hyperparathyroidism (CMS-HCC) ?  ?  ?  ?66 year old female with  primary hyperparathyroidism. ?1.  We will have her undergo a sestamibi scan to localize the parathyroid adenoma. ?2.  Discussed with her that once were able to localize the adenoma we would proceed to surgery to excise the parathyroid adenoma. ?3.  I discussed with her the risk and benefits of the procedure to include but not limited to: Infection, bleeding, damage to surrounding structures, possible need for further surgery.  The patient voiced understanding and wished to proceed. ?  ?High level medical decision making ? ?

## 2022-01-08 NOTE — Progress Notes (Signed)
Surgical Instructions    Your procedure is scheduled on Thursday, June 22nd, 2023.   Report to [facility] Main Entrance "A" at 07:15 A.M., then check in with the Admitting office.  Call this number if you have problems the morning of surgery:  [phone]   If you have any questions prior to your surgery date call [phone]: Open Monday-Friday 8am-4pm    Remember:  Do not eat after midnight the night before your surgery  You may drink clear liquids until 06:15 the morning of your surgery.   Clear liquids allowed are: Water, Non-Citrus Juices (without pulp), Carbonated Beverages, Clear Tea, Black Coffee ONLY (NO MILK, CREAM OR POWDERED CREAMER of any kind), and Gatorade  Patient Instructions  The night before surgery:  No food after midnight. ONLY clear liquids after midnight  The day of surgery (if you do NOT have diabetes):  Drink ONE (1) Pre-Surgery Clear Ensure by 06:15 the morning of surgery. Drink in one sitting. Do not sip.  This drink was given to you during your hospital  pre-op appointment visit.  Nothing else to drink after completing the  Pre-Surgery Clear Ensure.     Take these medicines the morning of surgery with A SIP OF WATER:   carvedilol (COREG)  esomeprazole (NEXIUM)  fluticasone (FLONASE)  HYDROcodone-acetaminophen (NORCO/VICODIN) hydrOXYzine (ATARAX/VISTARIL)  ipratropium-albuterol (DUONEB)  levETIRAcetam (KEPPRA) meclizine (ANTIVERT)  pregabalin (LYRICA) TRELEGY ELLIPTA  UBRELVY  VIIBRYD  If needed:  Glycerin-Hypromellose-PEG 400 (DRY EYE RELIEF DROPS OP) loperamide (IMODIUM)  ondansetron (ZOFRAN-ODT)  PROAIR HFA   Hold dapagliflozin propanediol (FARXIGA) the day before surgery and the day of surgery  Please bring all inhalers with you the day of surgery.    As of today, STOP taking any Aspirin (unless otherwise instructed by your surgeon) Aleve, Naproxen, Ibuprofen, Motrin, Advil, Goody's, BC's, all herbal medications, fish oil, and  all vitamins.    The day of surgery:          Do not wear jewelry or makeup Do not wear lotions, powders, perfumes, or deodorant. Do not shave 48 hours prior to surgery.   Do not bring valuables to the hospital. Do not wear nail polish, gel polish, artificial nails, or any other type of covering on natural nails (fingers and toes) If you have artificial nails or gel coating that need to be removed by a nail salon, please have this removed prior to surgery. Artificial nails or gel coating may interfere with anesthesia's ability to adequately monitor your vital signs.   [facility] is not responsible for any belongings or valuables. .   Do NOT Smoke (Tobacco/Vaping)  24 hours prior to your procedure  If you use a CPAP at night, you may bring your mask for your overnight stay.   Contacts, glasses, hearing aids, dentures or partials may not be worn into surgery, please bring cases for these belongings   For patients admitted to the hospital, discharge time will be determined by your treatment team.   Patients discharged the day of surgery will not be allowed to drive home, and someone needs to stay with them for 24 hours.   SURGICAL WAITING ROOM VISITATION Patients having surgery or a procedure in a hospital may have two support people. Children under the age of 4 must have an adult with them who is not the patient. They may stay in the waiting area during the procedure and may switch out with other visitors. If the patient needs to stay at the hospital during part  of their recovery, the visitor guidelines for inpatient rooms apply.  Please refer to the [facility] website for the visitor guidelines for Inpatients (after your surgery is over and you are in a regular room).     Special instructions:    Oral Hygiene is also important to reduce your risk of infection.  Remember - BRUSH YOUR TEETH THE MORNING OF SURGERY WITH YOUR REGULAR TOOTHPASTE   - Preparing For  Surgery  Before surgery, you can play an important role. Because skin is not sterile, your skin needs to be as free of germs as possible. You can reduce the number of germs on your skin by washing with CHG (chlorahexidine gluconate) Soap before surgery.  CHG is an antiseptic cleaner which kills germs and bonds with the skin to continue killing germs even after washing.     Please do not use if you have an allergy to CHG or antibacterial soaps. If your skin becomes reddened/irritated stop using the CHG.  Do not shave (including legs and underarms) for at least 48 hours prior to first CHG shower. It is OK to shave your face.  Please follow these instructions carefully.     Shower the NIGHT BEFORE SURGERY and the MORNING OF SURGERY with CHG Soap.   If you chose to wash your hair, wash your hair first as usual with your normal shampoo. After you shampoo, rinse your hair and body thoroughly to remove the shampoo.  Then [employer] and genitals (private parts) with your normal soap and rinse thoroughly to remove soap.  After that Use CHG Soap as you would any other liquid soap. You can apply CHG directly to the skin and wash gently with a scrungie or a clean washcloth.   Apply the CHG Soap to your body ONLY FROM THE NECK DOWN.  Do not use on open wounds or open sores. Avoid contact with your eyes, ears, mouth and genitals (private parts). Wash Face and genitals (private parts)  with your normal soap.   Wash thoroughly, paying special attention to the area where your surgery will be performed.  Thoroughly rinse your body with warm water from the neck down.  DO NOT shower/wash with your normal soap after using and rinsing off the CHG Soap.  Pat yourself dry with a CLEAN TOWEL.  Wear CLEAN PAJAMAS to bed the night before surgery  Place CLEAN SHEETS on your bed the night before your surgery  DO NOT SLEEP WITH PETS.   Day of Surgery:  Take a shower with CHG soap. Wear Clean/Comfortable  clothing the morning of surgery Do not apply any deodorants/lotions.   Remember to brush your teeth WITH YOUR REGULAR TOOTHPASTE.    If you received a COVID test during your pre-op visit, it is requested that you wear a mask when out in public, stay away from anyone that may not be feeling well, and notify your surgeon if you develop symptoms. If you have been in contact with anyone that has tested positive in the last 10 days, please notify your surgeon.    Please read over the following fact sheets that you were given.

## 2022-01-09 ENCOUNTER — Other Ambulatory Visit (HOSPITAL_COMMUNITY): Payer: 59

## 2022-01-09 ENCOUNTER — Other Ambulatory Visit: Payer: Self-pay

## 2022-01-09 ENCOUNTER — Encounter (HOSPITAL_COMMUNITY): Payer: Self-pay | Admitting: General Surgery

## 2022-01-09 ENCOUNTER — Encounter (HOSPITAL_COMMUNITY)
Admission: RE | Admit: 2022-01-09 | Discharge: 2022-01-09 | Disposition: A | Discharge status: Home / Self Care | Payer: 59 | Source: Ambulatory Visit | Attending: General Surgery | Admitting: General Surgery

## 2022-01-09 VITALS — BP 108/55 | HR 77 | Temp 98.1°F | Resp 17 | Ht 64.0 in | Wt 240.7 lb

## 2022-01-09 DIAGNOSIS — G4733 Obstructive sleep apnea (adult) (pediatric): Secondary | ICD-10-CM | POA: Diagnosis not present

## 2022-01-09 DIAGNOSIS — Z9981 Dependence on supplemental oxygen: Secondary | ICD-10-CM | POA: Diagnosis not present

## 2022-01-09 DIAGNOSIS — Z9989 Dependence on other enabling machines and devices: Secondary | ICD-10-CM | POA: Diagnosis not present

## 2022-01-09 DIAGNOSIS — N183 Chronic kidney disease, stage 3 unspecified: Secondary | ICD-10-CM | POA: Insufficient documentation

## 2022-01-09 DIAGNOSIS — I129 Hypertensive chronic kidney disease with stage 1 through stage 4 chronic kidney disease, or unspecified chronic kidney disease: Secondary | ICD-10-CM | POA: Insufficient documentation

## 2022-01-09 DIAGNOSIS — E210 Primary hyperparathyroidism: Secondary | ICD-10-CM | POA: Insufficient documentation

## 2022-01-09 DIAGNOSIS — J449 Chronic obstructive pulmonary disease, unspecified: Secondary | ICD-10-CM | POA: Diagnosis not present

## 2022-01-09 DIAGNOSIS — Z01818 Encounter for other preprocedural examination: Secondary | ICD-10-CM | POA: Insufficient documentation

## 2022-01-09 LAB — CBC
HCT: 45.9 % (ref 36.0–46.0)
Hemoglobin: 15.3 g/dL — ABNORMAL HIGH (ref 12.0–15.0)
MCH: 29.5 pg (ref 26.0–34.0)
MCHC: 33.3 g/dL (ref 30.0–36.0)
MCV: 88.4 fL (ref 80.0–100.0)
Platelets: 228 10*3/uL (ref 150–400)
RBC: 5.19 MIL/uL — ABNORMAL HIGH (ref 3.87–5.11)
RDW: 13.5 % (ref 11.5–15.5)
WBC: 4.6 10*3/uL (ref 4.0–10.5)
nRBC: 0 % (ref 0.0–0.2)

## 2022-01-09 LAB — BASIC METABOLIC PANEL
Anion gap: 7 (ref 5–15)
BUN: 17 mg/dL (ref 8–23)
CO2: 33 mmol/L — ABNORMAL HIGH (ref 22–32)
Calcium: 11.7 mg/dL — ABNORMAL HIGH (ref 8.9–10.3)
Chloride: 103 mmol/L (ref 98–111)
Creatinine, Ser: 1.37 mg/dL — ABNORMAL HIGH (ref 0.44–1.00)
GFR, Estimated: 43 mL/min — ABNORMAL LOW (ref >60)
Glucose, Bld: 120 mg/dL — ABNORMAL HIGH (ref 70–99)
Potassium: 4.3 mmol/L (ref 3.5–5.1)
Sodium: 143 mmol/L (ref 135–145)

## 2022-01-09 NOTE — Progress Notes (Addendum)
PCP - Jaclynn Major NP Cardiologist - Maurice Small C. Ruoff PA  PPM/ICD - denies Device Orders -  Rep Notified -   Chest x-ray - none EKG - 01/09/22 Stress Test - none ECHO - 05/12/21 Cardiac Cath - none  Sleep Study - yes CPAP - yes  Fasting Blood Sugar - na Checks Blood Sugar _____ times a day  Blood Thinner Instructions:na Aspirin Instructions:na  ERAS Protcol -yes- clear liquids until 0715 PRE-SURGERY Ensure or G2- Ensure  COVID TEST- na   Anesthesia review: yes-home O2  Patient denies shortness of breath, fever, cough and chest pain at PAT appointment   All instructions explained to the patient, with a verbal understanding of the material. Patient agrees to go over the instructions while at home for a better understanding. Patient also instructed to wear a mask when out in public prior to surgery. The opportunity to ask questions was provided.

## 2022-01-12 NOTE — Progress Notes (Signed)
Anesthesia Chart Review:  Case: [id number] Date/Time: 01/15/22 0900   Procedure: RIGHT PARATHYROIDECTOMY (Right)   Anesthesia type: General   Pre-op diagnosis: PRIMARY HYPERPARATHYROIDISM   Location: [facility] OR ROOM 02 / [facility]   Surgeons: Ralene Ok, MD       DISCUSSION: Pt is 66 years old with hx OSA (uses CPAP with 2L O2 at night), COPD, asthma, CKD, hyperparathyroidism.   Hx hypoxic/bradycardic arrest and a second PEA arrest same day on 09/21/14 while hospitalized for acute respiratory failure; hospitalization complicated by AKI, volume overload/pulmonary edema and significant deconditioning/functional quadriplegia r/t critical illness polyneuropathy; required tracheostomy  Calcium of 11.7 is consistent with prior results/hyperparathyroidism  VS: BP (!) 108/55   Pulse 77   Temp 36.7 C (Oral)   Resp 17   Ht '5\' 4"'$  (1.626 m)   Wt 109.2 kg   SpO2 94%   BMI 41.32 kg/m   PROVIDERS: - PCP is Jaclynn Major, NP - Cardiology care by Nestor Lewandowsky, PA who cleared pt for surgery at last office visit 09/30/21 (notes in care everywhere)  - Endocrinologist is Renato Shin, MD. Last office visit 10/30/21  LABS: - CBC 01/09/22 - BMP 01/09/22: glucose 120, Cr 1.37, calcium 11.7   EKG 01/09/22: Sinus rhythm with 1st degree A-V block. Cannot rule out Anterior infarct , age undetermined. Appears stable compared to EKG 08/18/21 (on paper chart)   CV: Echo 05/12/21 (care everywhere):  Technically difficult 2D M-mode and color flow Doppler echocardiogram  demonstrates normal left ventricular chamber size with overall preserved  left ventricular performance.  Not all wall meds are visualized however.  2.  The aortic, mitral, and tricuspid valves appear pliable  3.  The atria appears to be of normal size along with the right ventricle  4.  No pericardial effusion is noted  Cardiac monitor 05/05/21 (care everywhere): 1.  Normal sinus rhythm predominates recording  2.  No significant  bradycardias noted  3.  Rare PACs PVCs are noted  4.  Benign recording  CT angio coronary 03/19/21 (care everywhere): -  Calcium score of 0.    -  Within limitations of study, normal course and trajectory of coronary arteries with no evidence of plaque, stenosis or calcium.  - Small bilateral pleural effusions.  - Mild hazy background pulmonary opacity, favor edema.    Past Medical History:  Diagnosis Date   Anemia    Anxiety    Arthritis    Asthma    CKD (chronic kidney disease), stage III (HCC)    Constipation    COPD (chronic obstructive pulmonary disease) (HCC)    Depression    Fibromyalgia    GERD (gastroesophageal reflux disease)    HTN (hypertension)    Hyperlipidemia    Hyperthyroidism    IBS (irritable bowel syndrome)    Migraines    OSA (obstructive sleep apnea)    Osteopenia    Peripheral neuropathy    Sepsis ([place]) 2016    Past Surgical History:  Procedure Laterality Date   CESAREAN SECTION     CHOLECYSTECTOMY     EYE SURGERY Bilateral    cataract   GASTROSTOMY W/ FEEDING TUBE  2015   TONSILLECTOMY     TRACHEOSTOMY     [facility]    MEDICATIONS:  AIMOVIG 70 MG/ML SOAJ   amitriptyline (ELAVIL) 50 MG tablet   Calcium Carbonate-Vitamin D 600-5 MG-MCG CAPS   carvedilol (COREG) 6.25 MG tablet   Cholecalciferol (VITAMIN D3) 50 MCG (2000 UT) CAPS   dapagliflozin  propanediol (FARXIGA) 10 MG TABS tablet   docusate sodium (COLACE) 100 MG capsule   esomeprazole (NEXIUM) 20 MG capsule   fluticasone (FLONASE) 50 MCG/ACT nasal spray   Galcanezumab-gnlm (EMGALITY) 120 MG/ML SOAJ   Glycerin-Hypromellose-PEG 400 (DRY EYE RELIEF DROPS OP)   HYDROcodone-acetaminophen (NORCO/VICODIN) 5-325 MG tablet   hydrOXYzine (ATARAX/VISTARIL) 25 MG tablet   ipratropium-albuterol (DUONEB) 0.5-2.5 (3) MG/3ML SOLN   levETIRAcetam (KEPPRA) 1000 MG tablet   LINZESS 145 MCG CAPS capsule   loperamide (IMODIUM) 1 MG/5ML solution   meclizine (ANTIVERT) 25 MG tablet   montelukast  (SINGULAIR) 10 MG tablet   Omega 3-6-9 Fatty Acids (TRIPLE OMEGA-3-6-9 PO)   ondansetron (ZOFRAN-ODT) 4 MG disintegrating tablet   OVER THE COUNTER MEDICATION   Potassium Chloride ER 20 MEQ TBCR   pregabalin (LYRICA) 200 MG capsule   PROAIR HFA 108 (90 Base) MCG/ACT inhaler   psyllium (REGULOID) 0.52 g capsule   Rimegepant Sulfate (NURTEC) 75 MG TBDP   torsemide (DEMADEX) 20 MG tablet   TRELEGY ELLIPTA 100-62.5-25 MCG/INH AEPB   UBRELVY 100 MG TABS   VIIBRYD 20 MG TABS   No current facility-administered medications for this encounter.    If no changes, I anticipate pt can proceed with surgery as scheduled.   Willeen Cass, PhD, FNP-BC [facility]/Anesthesiology Phone: [phone] 01/12/2022 2:13 PM

## 2022-01-12 NOTE — Anesthesia Preprocedure Evaluation (Addendum)
Anesthesia Evaluation  Patient identified by MRN, date of birth, ID band Patient awake    Reviewed: Allergy & Precautions, NPO status , Patient's Chart, lab work & pertinent test results  Airway Mallampati: II  TM Distance: >3 FB Neck ROM: Full    Dental  (+) Edentulous Upper, Edentulous Lower   Pulmonary asthma , sleep apnea and Oxygen sleep apnea , COPD,    Pulmonary exam normal breath sounds clear to auscultation       Cardiovascular hypertension, Pt. on medications negative cardio ROS Normal cardiovascular exam Rhythm:Regular Rate:Normal     Neuro/Psych  Headaches, Anxiety Depression negative psych ROS   GI/Hepatic Neg liver ROS, GERD  ,  Endo/Other  Hyperthyroidism Morbid obesity  Renal/GU Renal InsufficiencyRenal disease  negative genitourinary   Musculoskeletal negative musculoskeletal ROS (+)   Abdominal (+) + obese,   Peds negative pediatric ROS (+)  Hematology  (+) Blood dyscrasia, anemia ,   Anesthesia Other Findings   Reproductive/Obstetrics negative OB ROS                           Anesthesia Physical Anesthesia Plan  ASA: 3  Anesthesia Plan: General   Post-op Pain Management: Dilaudid IV   Induction: Intravenous  PONV Risk Score and Plan: 3 and Ondansetron, Dexamethasone, Midazolam and Treatment may vary due to age or medical condition  Airway Management Planned: Oral ETT  Additional Equipment:   Intra-op Plan:   Post-operative Plan: Extubation in OR  Informed Consent: I have reviewed the patients History and Physical, chart, labs and discussed the procedure including the risks, benefits and alternatives for the proposed anesthesia with the patient or authorized representative who has indicated his/her understanding and acceptance.     Dental advisory given  Plan Discussed with: CRNA  Anesthesia Plan Comments: (See APP note by Joslyn Hy, FNP )        Anesthesia Quick Evaluation

## 2022-01-14 NOTE — H&P (Signed)
Chief Complaint: thyroid     History of Present Illness: Jamie Fields is a 66 y.o. female who is seen today as an office consultation at the request of Dr. Loanne Drilling for evaluation of thyroid  .     Patient is a 66 year old female who comes in with a history of anemia, COPD, GERD, history of seizures, and a diagnosis of primary hyperparathyroidism. Patient was previously worked up by Dr. Loanne Drilling which revealed elevated calciums.  In looking back at her record she had calciums of 11.2 and 12.1 back in April and October 2022 respectively.  Patient also with an elevated PTH level.  PTH levels were 97 in April and 107 and October.  Patient has not had any imaging studies for parathyroid glands.   Patient denies any history of kidney stones, pathological fractures, muscle cramping, fatigue or GI changes.        Review of Systems: A complete review of systems was obtained from the patient.  I have reviewed this information and discussed as appropriate with the patient.  See HPI as well for other ROS.   Review of Systems  Constitutional: Negative for fever.  HENT: Negative for congestion.   Eyes: Negative for blurred vision.  Respiratory: Negative for cough, shortness of breath and wheezing.   Cardiovascular: Negative for chest pain and palpitations.  Gastrointestinal: Negative for heartburn.  Genitourinary: Negative for dysuria.  Musculoskeletal: Negative for myalgias.  Skin: Negative for rash.  Neurological: Negative for dizziness and headaches.  Psychiatric/Behavioral: Negative for depression and suicidal ideas.  All other systems reviewed and are negative.     Medical History: Past Medical History Past Medical History: Diagnosis        Date            Anemia                         Anxiety                         Arthritis                         Asthma, unspecified asthma severity, unspecified whether complicated, unspecified whether persistent                       Chronic  kidney disease                       COPD (chronic obstructive pulmonary disease) (CMS-HCC)                        GERD (gastroesophageal reflux disease)                   Hyperlipidemia                         Seizures (CMS-HCC)              Sleep apnea                 Thyroid disease                  There is no problem list on file for this patient.     Past Surgical History Past Surgical History: Procedure       Laterality  Date            bone spur surgery                                 cataract surgery          N/A                   CESAREAN SECTION                                     TONSILLECTOMY                               TRACHEOSTOMY                          Allergies No Known Allergies     Current Outpatient Medications on File Prior to Visit Medication       Sig       Dispense         Refill            amitriptyline (ELAVIL) 50 MG tablet   Take by mouth                                      esomeprazole (NEXIUM) 20 MG DR capsule            Take by mouth                                      fluticasone propionate (FLONASE) 50 mcg/actuation nasal spray   one spray by Both Nostrils route daily.                                  galcanezumab-gnlm (EMGALITY PEN) 120 mg/mL PnIj      Inject subcutaneously                           HYDROcodone-acetaminophen (NORCO) 5-325 mg tablet Take by mouth every 8 (eight) hours as needed                                levETIRAcetam (KEPPRA) 1000 MG tablet   Take by mouth                                      montelukast (SINGULAIR) 10 mg tablet        Take by mouth                                      rimegepant (NURTEC ODT) 75 mg disintegrating tablet       Take by mouth  TORsemide (DEMADEX) 20 MG tablet         Take by mouth                                      albuterol (PROVENTIL) 2.5 mg /3 mL (0.083 %) nebulizer solution every 6 (six) hours as needed                                   ascorbic acid, vitamin C, (VITAMIN C) 100 MG tablet           Take by mouth                                      docusate calcium (SURFAK) 240 mg capsule           Take by mouth                                      hydrOXYzine (ATARAX) 25 MG tablet           Take by mouth 2 (two) times daily as needed                                     ipratropium-albuteroL (DUO-NEB) nebulizer solution            INHALE THE CONTENTS OF 1 VIAL VIA NEBULIZER FOUR TIMES A DAY AS NEEDED                                 labetaloL (TRANDATE) 200 MG tablet                                                 LINZESS 145 mcg capsule                                           meclizine (ANTIVERT) 25 mg tablet                                         ondansetron (ZOFRAN-ODT) 4 MG disintegrating tablet                                             potassium chloride (KLOR-CON) 20 MEQ ER tablet Take 20 mEq by mouth once daily WITH FOOD                                pregabalin (LYRICA) 200 MG capsule           Take by mouth  vilazodone (VIIBRYD) 20 mg tablet                                  No current facility-administered medications on file prior to visit.     Family History Family History Problem           Relation           Age of Onset            High blood pressure (Hypertension)   Mother              Diabetes          Mother              Heart valve disease    Father               Skin cancer     Brother                         Heart valve disease    Brother                    Social History   Tobacco Use Smoking Status           Never Smokeless Tobacco   Never     Social History Social History     Socioeconomic History            Marital status:  Widowed Tobacco Use            Smoking status:          Never            Smokeless tobacco:    Never Vaping Use            Vaping Use:    Never used Substance and Sexual Activity            Alcohol use:    Never             Drug use:        Never       Objective:   Vitals:              09/11/21 1044 BP:      138/70 Pulse:  95 Temp:  36.7 C (98.1 F) SpO2:  96% Weight:            (!) 129.5 kg (285 lb 9.6 oz) Height: 162.6 cm ('5\' 4"'$ )   Body mass index is 49.02 kg/m.   Physical Exam Constitutional:      Appearance: Normal appearance.  HENT:     Head: Normocephalic and atraumatic.     Mouth/Throat:     Mouth: Mucous membranes are moist.     Pharynx: Oropharynx is clear.  Eyes:     General: No scleral icterus.    Pupils: Pupils are equal, round, and reactive to light.  Neck:     Comments: No palp masses Cardiovascular:     Rate and Rhythm: Normal rate and regular rhythm.     Pulses: Normal pulses.     Heart sounds: No murmur heard.   No friction rub. No gallop.  Pulmonary:     Effort: Pulmonary effort is normal. No respiratory distress.     Breath sounds: Normal breath sounds. No stridor.  Abdominal:     General: Abdomen is flat.  Musculoskeletal:  General: No swelling.  Skin:    General: Skin is warm.  Neurological:     General: No focal deficit present.     Mental Status: She is alert and oriented to person, place, and time. Mental status is at baseline.  Psychiatric:        Mood and Affect: Mood normal.        Thought Content: Thought content normal.        Judgment: Judgment normal.            Assessment and Plan:    Diagnoses and all orders for this visit:   Primary hyperparathyroidism (CMS-HCC)       66 year old female with primary hyperparathyroidism. 1. We will proceed with right parathyroidectomy. 3.  I discussed with her the risk and benefits of the procedure to include but not limited to: Infection, bleeding, damage to surrounding structures, possible need for further surgery.  The patient voiced understanding and wished to proceed.

## 2022-01-15 ENCOUNTER — Ambulatory Visit (HOSPITAL_BASED_OUTPATIENT_CLINIC_OR_DEPARTMENT_OTHER): Payer: 59 | Admitting: Anesthesiology

## 2022-01-15 ENCOUNTER — Encounter (HOSPITAL_COMMUNITY)
Admission: RE | Disposition: A | Discharge status: Home / Self Care | Payer: Self-pay | Source: Home / Self Care | Attending: General Surgery

## 2022-01-15 ENCOUNTER — Ambulatory Visit (HOSPITAL_COMMUNITY): Payer: 59 | Admitting: Emergency Medicine

## 2022-01-15 ENCOUNTER — Observation Stay (HOSPITAL_COMMUNITY)
Admission: RE | Admit: 2022-01-15 | Discharge: 2022-01-16 | Disposition: A | Discharge status: Home / Self Care | Payer: 59 | Attending: General Surgery | Admitting: General Surgery

## 2022-01-15 ENCOUNTER — Encounter (HOSPITAL_COMMUNITY): Payer: Self-pay | Admitting: General Surgery

## 2022-01-15 DIAGNOSIS — E213 Hyperparathyroidism, unspecified: Secondary | ICD-10-CM

## 2022-01-15 DIAGNOSIS — I10 Essential (primary) hypertension: Secondary | ICD-10-CM | POA: Diagnosis not present

## 2022-01-15 DIAGNOSIS — G4730 Sleep apnea, unspecified: Secondary | ICD-10-CM

## 2022-01-15 DIAGNOSIS — Z01818 Encounter for other preprocedural examination: Secondary | ICD-10-CM

## 2022-01-15 DIAGNOSIS — J449 Chronic obstructive pulmonary disease, unspecified: Secondary | ICD-10-CM | POA: Insufficient documentation

## 2022-01-15 DIAGNOSIS — J45909 Unspecified asthma, uncomplicated: Secondary | ICD-10-CM | POA: Diagnosis not present

## 2022-01-15 DIAGNOSIS — E210 Primary hyperparathyroidism: Principal | ICD-10-CM | POA: Insufficient documentation

## 2022-01-15 DIAGNOSIS — N189 Chronic kidney disease, unspecified: Secondary | ICD-10-CM | POA: Insufficient documentation

## 2022-01-15 DIAGNOSIS — Z79899 Other long term (current) drug therapy: Secondary | ICD-10-CM | POA: Diagnosis not present

## 2022-01-15 DIAGNOSIS — E892 Postprocedural hypoparathyroidism: Secondary | ICD-10-CM

## 2022-01-15 HISTORY — PX: PARATHYROIDECTOMY: SHX19

## 2022-01-15 SURGERY — PARATHYROIDECTOMY
Anesthesia: General | Site: Neck | Laterality: Right

## 2022-01-15 MED ORDER — LIDOCAINE 2% (20 MG/ML) 5 ML SYRINGE
INTRAMUSCULAR | Status: DC | PRN
  Administered 2022-01-15: 100 mg via INTRAVENOUS

## 2022-01-15 MED ORDER — MECLIZINE HCL 25 MG PO TABS
25.0000 mg | ORAL_TABLET | Freq: Every day | ORAL | Status: DC
  Administered 2022-01-15 – 2022-01-16 (×2): 25 mg via ORAL
  Filled 2022-01-15 (×2): qty 1

## 2022-01-15 MED ORDER — PHENOL 1.4 % MT LIQD: 1.0000 | OROMUCOSAL | Status: DC | PRN

## 2022-01-15 MED ORDER — ROCURONIUM BROMIDE 10 MG/ML (PF) SYRINGE
PREFILLED_SYRINGE | INTRAVENOUS | Status: AC
  Filled 2022-01-15: qty 10

## 2022-01-15 MED ORDER — ENSURE PRE-SURGERY PO LIQD: 296.0000 mL | Freq: Once | ORAL | Status: DC

## 2022-01-15 MED ORDER — FENTANYL CITRATE (PF) 250 MCG/5ML IJ SOLN
INTRAMUSCULAR | Status: DC | PRN
  Administered 2022-01-15: 100 ug via INTRAVENOUS
  Administered 2022-01-15 (×3): 50 ug via INTRAVENOUS

## 2022-01-15 MED ORDER — IPRATROPIUM-ALBUTEROL 0.5-2.5 (3) MG/3ML IN SOLN: 3.0000 mL | RESPIRATORY_TRACT | Status: DC | PRN

## 2022-01-15 MED ORDER — BUPIVACAINE-EPINEPHRINE (PF) 0.25% -1:200000 IJ SOLN
INTRAMUSCULAR | Status: AC
  Filled 2022-01-15: qty 30

## 2022-01-15 MED ORDER — ONDANSETRON 4 MG PO TBDP: 4.0000 mg | ORAL_TABLET | Freq: Four times a day (QID) | ORAL | Status: DC | PRN

## 2022-01-15 MED ORDER — POTASSIUM CHLORIDE CRYS ER 10 MEQ PO TBCR
20.0000 meq | EXTENDED_RELEASE_TABLET | Freq: Every day | ORAL | Status: DC
  Administered 2022-01-16: 20 meq via ORAL
  Filled 2022-01-15: qty 2

## 2022-01-15 MED ORDER — ONDANSETRON HCL 4 MG/2ML IJ SOLN
INTRAMUSCULAR | Status: DC | PRN
  Administered 2022-01-15: 4 mg via INTRAVENOUS

## 2022-01-15 MED ORDER — HYDROMORPHONE HCL 1 MG/ML IJ SOLN
INTRAMUSCULAR | Status: AC
  Filled 2022-01-15: qty 1

## 2022-01-15 MED ORDER — HYDROMORPHONE HCL 1 MG/ML IJ SOLN: 1.0000 mg | INTRAMUSCULAR | Status: DC | PRN

## 2022-01-15 MED ORDER — ONDANSETRON HCL 4 MG/2ML IJ SOLN: 4.0000 mg | Freq: Four times a day (QID) | INTRAMUSCULAR | Status: DC | PRN

## 2022-01-15 MED ORDER — ONDANSETRON HCL 4 MG/2ML IJ SOLN
INTRAMUSCULAR | Status: AC
  Filled 2022-01-15: qty 2

## 2022-01-15 MED ORDER — CARVEDILOL 6.25 MG PO TABS
6.2500 mg | ORAL_TABLET | Freq: Two times a day (BID) | ORAL | Status: DC
  Administered 2022-01-15 – 2022-01-16 (×2): 6.25 mg via ORAL
  Filled 2022-01-15 (×2): qty 1

## 2022-01-15 MED ORDER — DEXTROSE-NACL 5-0.9 % IV SOLN: INTRAVENOUS | Status: DC

## 2022-01-15 MED ORDER — MONTELUKAST SODIUM 10 MG PO TABS
10.0000 mg | ORAL_TABLET | Freq: Every day | ORAL | Status: DC
Start: 2022-01-15 — End: 2022-01-16
  Administered 2022-01-15: 10 mg via ORAL
  Filled 2022-01-15: qty 1

## 2022-01-15 MED ORDER — RIMEGEPANT SULFATE 75 MG PO TBDP: 75.0000 mg | ORAL_TABLET | Freq: Every day | ORAL | Status: DC | PRN

## 2022-01-15 MED ORDER — OXYCODONE HCL 5 MG PO TABS: 5.0000 mg | ORAL_TABLET | Freq: Once | ORAL | Status: DC | PRN

## 2022-01-15 MED ORDER — AMISULPRIDE (ANTIEMETIC) 5 MG/2ML IV SOLN: 10.0000 mg | Freq: Once | INTRAVENOUS | Status: DC | PRN

## 2022-01-15 MED ORDER — CHLORHEXIDINE GLUCONATE CLOTH 2 % EX PADS: 6.0000 | MEDICATED_PAD | Freq: Once | CUTANEOUS | Status: DC

## 2022-01-15 MED ORDER — DEXAMETHASONE SODIUM PHOSPHATE 10 MG/ML IJ SOLN
INTRAMUSCULAR | Status: DC | PRN
  Administered 2022-01-15: 10 mg via INTRAVENOUS

## 2022-01-15 MED ORDER — PROMETHAZINE HCL 25 MG/ML IJ SOLN: 6.2500 mg | INTRAMUSCULAR | Status: DC | PRN

## 2022-01-15 MED ORDER — CEFAZOLIN SODIUM-DEXTROSE 2-4 GM/100ML-% IV SOLN
2.0000 g | INTRAVENOUS | Status: AC
  Administered 2022-01-15: 2 g via INTRAVENOUS
  Filled 2022-01-15: qty 100

## 2022-01-15 MED ORDER — VILAZODONE HCL 20 MG PO TABS
20.0000 mg | ORAL_TABLET | Freq: Every day | ORAL | Status: DC
  Administered 2022-01-16: 20 mg via ORAL
  Filled 2022-01-15: qty 1

## 2022-01-15 MED ORDER — LOPERAMIDE HCL 1 MG/7.5ML PO SUSP
4.0000 mg | ORAL | Status: DC | PRN
Start: 2022-01-15 — End: 2022-01-16

## 2022-01-15 MED ORDER — HYDROCODONE-ACETAMINOPHEN 5-325 MG PO TABS
1.0000 | ORAL_TABLET | ORAL | Status: DC | PRN
  Administered 2022-01-15 – 2022-01-16 (×3): 2 via ORAL
  Filled 2022-01-15 (×3): qty 2

## 2022-01-15 MED ORDER — PANTOPRAZOLE SODIUM 40 MG PO TBEC
40.0000 mg | DELAYED_RELEASE_TABLET | Freq: Every day | ORAL | Status: DC
  Administered 2022-01-16: 40 mg via ORAL
  Filled 2022-01-15: qty 1

## 2022-01-15 MED ORDER — ORAL CARE MOUTH RINSE: 15.0000 mL | Freq: Once | OROMUCOSAL | Status: AC

## 2022-01-15 MED ORDER — PREGABALIN 100 MG PO CAPS
200.0000 mg | ORAL_CAPSULE | Freq: Three times a day (TID) | ORAL | Status: DC
  Administered 2022-01-15 – 2022-01-16 (×3): 200 mg via ORAL
  Filled 2022-01-15 (×3): qty 2

## 2022-01-15 MED ORDER — ONDANSETRON 4 MG PO TBDP: 4.0000 mg | ORAL_TABLET | Freq: Three times a day (TID) | ORAL | Status: DC | PRN

## 2022-01-15 MED ORDER — LACTATED RINGERS IV SOLN: INTRAVENOUS | Status: DC

## 2022-01-15 MED ORDER — HEMOSTATIC AGENTS (NO CHARGE) OPTIME
TOPICAL | Status: DC | PRN
  Administered 2022-01-15: 1 via TOPICAL

## 2022-01-15 MED ORDER — SUGAMMADEX SODIUM 200 MG/2ML IV SOLN
INTRAVENOUS | Status: DC | PRN
  Administered 2022-01-15: 200 mg via INTRAVENOUS

## 2022-01-15 MED ORDER — PHENYLEPHRINE HCL-NACL 20-0.9 MG/250ML-% IV SOLN
INTRAVENOUS | Status: DC | PRN
  Administered 2022-01-15: 40 ug/min via INTRAVENOUS

## 2022-01-15 MED ORDER — ALBUTEROL SULFATE (2.5 MG/3ML) 0.083% IN NEBU
2.5000 mg | INHALATION_SOLUTION | Freq: Four times a day (QID) | RESPIRATORY_TRACT | Status: DC | PRN
Start: 2022-01-15 — End: 2022-01-16

## 2022-01-15 MED ORDER — LIDOCAINE 2% (20 MG/ML) 5 ML SYRINGE
INTRAMUSCULAR | Status: AC
Start: 2022-01-15 — End: ?
  Filled 2022-01-15: qty 5

## 2022-01-15 MED ORDER — ERENUMAB-AOOE 70 MG/ML ~~LOC~~ SOAJ
70.0000 mg | SUBCUTANEOUS | Status: DC
Start: 2022-01-15 — End: 2022-01-15

## 2022-01-15 MED ORDER — LEVETIRACETAM 500 MG PO TABS
1000.0000 mg | ORAL_TABLET | Freq: Two times a day (BID) | ORAL | Status: DC
  Administered 2022-01-15 – 2022-01-16 (×2): 1000 mg via ORAL
  Filled 2022-01-15 (×2): qty 2

## 2022-01-15 MED ORDER — ROCURONIUM BROMIDE 10 MG/ML (PF) SYRINGE
PREFILLED_SYRINGE | INTRAVENOUS | Status: DC | PRN
  Administered 2022-01-15: 100 mg via INTRAVENOUS

## 2022-01-15 MED ORDER — MIDAZOLAM HCL 5 MG/5ML IJ SOLN
INTRAMUSCULAR | Status: DC | PRN
  Administered 2022-01-15: 1 mg via INTRAVENOUS

## 2022-01-15 MED ORDER — LINACLOTIDE 145 MCG PO CAPS
145.0000 ug | ORAL_CAPSULE | Freq: Every morning | ORAL | Status: DC
  Administered 2022-01-16: 145 ug via ORAL
  Filled 2022-01-15: qty 1

## 2022-01-15 MED ORDER — VITAMIN D 25 MCG (1000 UNIT) PO TABS
4000.0000 [IU] | ORAL_TABLET | Freq: Every morning | ORAL | Status: DC
  Administered 2022-01-16: 4000 [IU] via ORAL
  Filled 2022-01-15: qty 4

## 2022-01-15 MED ORDER — SODIUM CHLORIDE (PF) 0.9 % IJ SOLN
INTRAMUSCULAR | Status: DC | PRN
  Administered 2022-01-15: 10 mL

## 2022-01-15 MED ORDER — MIDAZOLAM HCL 2 MG/2ML IJ SOLN
INTRAMUSCULAR | Status: AC
  Filled 2022-01-15: qty 2

## 2022-01-15 MED ORDER — HYDROMORPHONE HCL 1 MG/ML IJ SOLN
0.2500 mg | INTRAMUSCULAR | Status: DC | PRN
  Administered 2022-01-15: 0.5 mg via INTRAVENOUS

## 2022-01-15 MED ORDER — PROPOFOL 10 MG/ML IV BOLUS
INTRAVENOUS | Status: AC
  Filled 2022-01-15: qty 20

## 2022-01-15 MED ORDER — TORSEMIDE 20 MG PO TABS
40.0000 mg | ORAL_TABLET | Freq: Every morning | ORAL | Status: DC
  Administered 2022-01-16: 40 mg via ORAL
  Filled 2022-01-15: qty 2

## 2022-01-15 MED ORDER — PROPOFOL 10 MG/ML IV BOLUS
INTRAVENOUS | Status: DC | PRN
  Administered 2022-01-15: 150 mg via INTRAVENOUS

## 2022-01-15 MED ORDER — AMITRIPTYLINE HCL 50 MG PO TABS
50.0000 mg | ORAL_TABLET | Freq: Every day | ORAL | Status: DC
  Administered 2022-01-15: 50 mg via ORAL
  Filled 2022-01-15: qty 1

## 2022-01-15 MED ORDER — CHLORHEXIDINE GLUCONATE 0.12 % MT SOLN
15.0000 mL | Freq: Once | OROMUCOSAL | Status: AC
  Administered 2022-01-15: 15 mL via OROMUCOSAL
  Filled 2022-01-15: qty 15

## 2022-01-15 MED ORDER — UBROGEPANT 100 MG PO TABS: 100.0000 mg | ORAL_TABLET | ORAL | Status: DC

## 2022-01-15 MED ORDER — DAPAGLIFLOZIN PROPANEDIOL 10 MG PO TABS
10.0000 mg | ORAL_TABLET | Freq: Every day | ORAL | Status: DC
  Administered 2022-01-16: 10 mg via ORAL
  Filled 2022-01-15: qty 1

## 2022-01-15 MED ORDER — DEXAMETHASONE SODIUM PHOSPHATE 10 MG/ML IJ SOLN
INTRAMUSCULAR | Status: AC
  Filled 2022-01-15: qty 1

## 2022-01-15 MED ORDER — OXYCODONE HCL 5 MG/5ML PO SOLN: 5.0000 mg | Freq: Once | ORAL | Status: DC | PRN

## 2022-01-15 MED ORDER — OYSTER SHELL CALCIUM/D3 500-5 MG-MCG PO TABS
1.0000 | ORAL_TABLET | Freq: Every day | ORAL | Status: DC
  Administered 2022-01-15 – 2022-01-16 (×2): 1 via ORAL
  Filled 2022-01-15 (×2): qty 1

## 2022-01-15 MED ORDER — FENTANYL CITRATE (PF) 250 MCG/5ML IJ SOLN
INTRAMUSCULAR | Status: AC
  Filled 2022-01-15: qty 5

## 2022-01-15 SURGICAL SUPPLY — 57 items
BAG COUNTER SPONGE SURGICOUNT (BAG) ×2 IMPLANT
CANISTER SUCT 3000ML PPV (MISCELLANEOUS) ×2 IMPLANT
CHLORAPREP W/TINT 26 (MISCELLANEOUS) ×2 IMPLANT
CLIP VESOCCLUDE MED 6/CT (CLIP) ×2 IMPLANT
CLIP VESOCCLUDE SM WIDE 6/CT (CLIP) ×2 IMPLANT
CNTNR URN SCR LID CUP LEK RST (MISCELLANEOUS) ×1 IMPLANT
CONT SPEC 4OZ STRL OR WHT (MISCELLANEOUS) ×6
COVER SURGICAL LIGHT HANDLE (MISCELLANEOUS) ×2 IMPLANT
DERMABOND ADHESIVE PROPEN (GAUZE/BANDAGES/DRESSINGS) ×1
DERMABOND ADVANCED (GAUZE/BANDAGES/DRESSINGS) ×1
DERMABOND ADVANCED .7 DNX12 (GAUZE/BANDAGES/DRESSINGS) IMPLANT
DERMABOND ADVANCED .7 DNX6 (GAUZE/BANDAGES/DRESSINGS) ×1 IMPLANT
DISSECTOR SURG LIGASURE 21 (MISCELLANEOUS) ×1 IMPLANT
DRAPE LAPAROTOMY 100X72 PEDS (DRAPES) ×2 IMPLANT
DRAPE SLUSH/WARMER DISC (DRAPES) IMPLANT
ELECT COATED BLADE 2.86 ST (ELECTRODE) ×2 IMPLANT
ELECT REM PT RETURN 9FT ADLT (ELECTROSURGICAL) ×2
ELECTRODE REM PT RTRN 9FT ADLT (ELECTROSURGICAL) ×1 IMPLANT
GAUZE 4X4 16PLY ~~LOC~~+RFID DBL (SPONGE) ×2 IMPLANT
GAUZE SPONGE 2X2 8PLY STRL LF (GAUZE/BANDAGES/DRESSINGS) ×1 IMPLANT
GLOVE BIO SURGEON STRL SZ 6.5 (GLOVE) ×1 IMPLANT
GLOVE BIO SURGEON STRL SZ7 (GLOVE) ×1 IMPLANT
GLOVE BIO SURGEON STRL SZ7.5 (GLOVE) ×2 IMPLANT
GLOVE BIOGEL PI IND STRL 6.5 (GLOVE) IMPLANT
GLOVE BIOGEL PI IND STRL 7.0 (GLOVE) IMPLANT
GLOVE BIOGEL PI IND STRL 8 (GLOVE) IMPLANT
GLOVE BIOGEL PI INDICATOR 6.5 (GLOVE) ×1
GLOVE BIOGEL PI INDICATOR 7.0 (GLOVE) ×2
GLOVE BIOGEL PI INDICATOR 8 (GLOVE)
GLOVE SURG SYN 7.5  E (GLOVE) ×2
GLOVE SURG SYN 7.5 E (GLOVE) ×1 IMPLANT
GLOVE SURG SYN 7.5 PF PI (GLOVE) ×1 IMPLANT
GOWN STRL REUS W/ TWL LRG LVL3 (GOWN DISPOSABLE) ×2 IMPLANT
GOWN STRL REUS W/TWL LRG LVL3 (GOWN DISPOSABLE) ×4
HEMOSTAT SURGICEL 2X4 FIBR (HEMOSTASIS) ×2 IMPLANT
ILLUMINATOR WAVEGUIDE N/F (MISCELLANEOUS) ×2 IMPLANT
KIT BASIN OR (CUSTOM PROCEDURE TRAY) ×2 IMPLANT
KIT TURNOVER KIT B (KITS) ×2 IMPLANT
NDL HYPO 25GX1X1/2 BEV (NEEDLE) IMPLANT
NEEDLE HYPO 25GX1X1/2 BEV (NEEDLE) IMPLANT
NS IRRIG 1000ML POUR BTL (IV SOLUTION) ×2 IMPLANT
PACK GENERAL/GYN (CUSTOM PROCEDURE TRAY) ×2 IMPLANT
PAD ARMBOARD 7.5X6 YLW CONV (MISCELLANEOUS) ×4 IMPLANT
PENCIL SMOKE EVACUATOR (MISCELLANEOUS) ×2 IMPLANT
SPONGE GAUZE 2X2 STER 10/PKG (GAUZE/BANDAGES/DRESSINGS) ×1
SPONGE INTESTINAL PEANUT (DISPOSABLE) ×2 IMPLANT
STRIP CLOSURE SKIN 1/2X4 (GAUZE/BANDAGES/DRESSINGS) ×2 IMPLANT
SUT MNCRL AB 4-0 PS2 18 (SUTURE) ×2 IMPLANT
SUT SILK 2 0 (SUTURE)
SUT SILK 2-0 18XBRD TIE 12 (SUTURE) IMPLANT
SUT SILK 3 0 (SUTURE)
SUT SILK 3-0 18XBRD TIE 12 (SUTURE) IMPLANT
SUT VIC AB 3-0 SH 18 (SUTURE) ×2 IMPLANT
SYR CONTROL 10ML LL (SYRINGE) IMPLANT
TOWEL GREEN STERILE (TOWEL DISPOSABLE) ×2 IMPLANT
TOWEL GREEN STERILE FF (TOWEL DISPOSABLE) ×2 IMPLANT
YANKAUER SUCT BULB TIP NO VENT (SUCTIONS) ×1 IMPLANT

## 2022-01-15 NOTE — Anesthesia Procedure Notes (Signed)
Procedure Name: Intubation Date/Time: 01/15/2022 9:43 AM  Performed by: Kyung Rudd, CRNAPre-anesthesia Checklist: Patient identified, Emergency Drugs available, Suction available and Patient being monitored Patient Re-evaluated:Patient Re-evaluated prior to induction Oxygen Delivery Method: Circle system utilized Preoxygenation: Pre-oxygenation with 100% oxygen Induction Type: IV induction Ventilation: Mask ventilation without difficulty and Oral airway inserted - appropriate to patient size Laryngoscope Size: Mac and 3 Grade View: Grade I Tube type: Oral Tube size: 7.0 mm Number of attempts: 1 Airway Equipment and Method: Stylet Placement Confirmation: ETT inserted through vocal cords under direct vision, positive ETCO2 and breath sounds checked- equal and bilateral Secured at: 21 cm Tube secured with: Tape Dental Injury: Teeth and Oropharynx as per pre-operative assessment

## 2022-01-15 NOTE — Op Note (Signed)
01/15/2022  11:38 AM  PATIENT:  Jamie Fields  66 y.o. female  PRE-OPERATIVE DIAGNOSIS:  PRIMARY HYPERPARATHYROIDISM  POST-OPERATIVE DIAGNOSIS:  same  PROCEDURE:  Procedure(s): RIGHT PARATHYROIDECTOMY (Right)  SURGEON:  Surgeon(s) and Role:    * Ralene Ok, MD - Primary  ASSISTANTS: Ewell Poe, RNFA   ANESTHESIA:   local and general  EBL:  minimal   BLOOD ADMINISTERED:none  DRAINS: none   LOCAL MEDICATIONS USED:  BUPIVICAINE   SPECIMEN:  Source of Specimen:  right parathyroid adenoma  DISPOSITION OF SPECIMEN:  PATHOLOGY  COUNTS:  YES  TOURNIQUET:  * No tourniquets in log *  DICTATION: .Dragon Dictation  Findings: Patient had a large right-sided parathyroid adenoma that was found in the adipose tissue behind the right clavicle area.  This was sent for pathology.  This was confirmed as hypercellular parathyroid tissue for frozen pathology.  Details of the procedure:  The patient was taken back to the operating room. The patient was placed in supine position with bilateral SCDs in place.  The patient was prepped and draped in the usual sterile fashion. After appropriate anitbiotics were confirmed, a time-out was confirmed and all facts were verified. A right-sided 3 cm incision was made approximately 2 fingerbreadths above the sternal notch. Bovie cautery was used to maintain hemostasis dissection was carried down through the platysma. The platysma was elevated and flaps were created superiorly and inferiorly to the thyroid cartilage as well as the sternal notch, repsectively. The strap muscles were identified in the midline and separated.  Right-sided strap muscles were elevated off the anterior surface of the thyroid. This dissection was carried laterally. We proceeded to dissect away the right thyroid lobe with Kitners from the surrounding musculature from the thyroid.  Upon visualizing the  inferior pole the right thyroid the right parathyroid tissue cannot be  seen.  Upon dissecting inferior towards the adipose tissue that would lean down to the thymus, an enlarge parathyroid gland was identified.  This was dissected out from the surrounding tissue.  Once it was removed a portion was sent to Pathology for a frozen section confirmation.Intraoperatively, Pathology confirmed that the specimen was indeed enlarge parathyroid tissue.   The area was irrigated out. The dissection bed was hemostatic. We placed fibrillar hemostatic agent into the wound. Strap muscles were then reapproximated in the midline with interrupted 3-0 Vicryl stitches. The platysma was reapproximated using 3-0 Vicryl stitches in interrupted fashion. Skin was then reapproximated using a running subcuticular 4-0 Monocryl. The skin was then dressed with Dermabond.   The patient was taken to the recovery room in stable condition.    PLAN OF CARE: Admit for overnight observation  PATIENT DISPOSITION:  PACU - hemodynamically stable.   Delay start of Pharmacological VTE agent (>24hrs) due to surgical blood loss or risk of bleeding: not applicable

## 2022-01-15 NOTE — Interval H&P Note (Signed)
History and Physical Interval Note:  01/15/2022 9:01 AM  Jamie Fields  has presented today for surgery, with the diagnosis of PRIMARY HYPERPARATHYROIDISM.  The various methods of treatment have been discussed with the patient and family. After consideration of risks, benefits and other options for treatment, the patient has consented to  Procedure(s): RIGHT PARATHYROIDECTOMY (Right) as a surgical intervention.  The patient's history has been reviewed, patient examined, no change in status, stable for surgery.  I have reviewed the patient's chart and labs.  Questions were answered to the patient's satisfaction.     Ralene Ok

## 2022-01-15 NOTE — Transfer of Care (Signed)
Immediate Anesthesia Transfer of Care Note  Patient: Jamie Fields  Procedure(s) Performed: RIGHT PARATHYROIDECTOMY (Right: Neck)  Patient Location: PACU  Anesthesia Type:General  Level of Consciousness: awake, alert  and oriented  Airway & Oxygen Therapy: Patient Spontanous Breathing and Patient connected to nasal cannula oxygen  Post-op Assessment: Report given to RN, Post -op Vital signs reviewed and stable and Patient moving all extremities X 4  Post vital signs: Reviewed and stable  Last Vitals:  Vitals Value Taken Time  BP 138/82 01/15/22 1159  Temp    Pulse 76 01/15/22 1202  Resp 19 01/15/22 1202  SpO2 94 % 01/15/22 1202  Vitals shown include unvalidated device data.  Last Pain:  Vitals:   01/15/22 0735  PainSc: 0-No pain      Patients Stated Pain Goal: 0 (48/40/39 [id number])  Complications: No notable events documented.

## 2022-01-16 ENCOUNTER — Encounter (HOSPITAL_COMMUNITY): Payer: Self-pay | Admitting: General Surgery

## 2022-01-16 DIAGNOSIS — E210 Primary hyperparathyroidism: Secondary | ICD-10-CM | POA: Diagnosis not present

## 2022-01-16 LAB — BASIC METABOLIC PANEL
Anion gap: 10 (ref 5–15)
BUN: 18 mg/dL (ref 8–23)
CO2: 25 mmol/L (ref 22–32)
Calcium: 9.9 mg/dL (ref 8.9–10.3)
Chloride: 106 mmol/L (ref 98–111)
Creatinine, Ser: 1.33 mg/dL — ABNORMAL HIGH (ref 0.44–1.00)
GFR, Estimated: 44 mL/min — ABNORMAL LOW (ref >60)
Glucose, Bld: 145 mg/dL — ABNORMAL HIGH (ref 70–99)
Potassium: 4.3 mmol/L (ref 3.5–5.1)
Sodium: 141 mmol/L (ref 135–145)

## 2022-01-19 LAB — SURGICAL PATHOLOGY

## 2022-06-29 ENCOUNTER — Encounter: Payer: Self-pay | Admitting: Internal Medicine

## 2022-06-29 ENCOUNTER — Ambulatory Visit (INDEPENDENT_AMBULATORY_CARE_PROVIDER_SITE_OTHER): Payer: 59 | Admitting: Internal Medicine

## 2022-06-29 VITALS — BP 126/78 | HR 76 | Temp 97.4°F | Resp 18 | Ht 66.0 in | Wt 238.2 lb

## 2022-06-29 DIAGNOSIS — Z23 Encounter for immunization: Secondary | ICD-10-CM

## 2022-06-29 DIAGNOSIS — E213 Hyperparathyroidism, unspecified: Secondary | ICD-10-CM | POA: Diagnosis not present

## 2022-06-29 DIAGNOSIS — M5441 Lumbago with sciatica, right side: Secondary | ICD-10-CM

## 2022-06-29 DIAGNOSIS — G8929 Other chronic pain: Secondary | ICD-10-CM

## 2022-06-29 DIAGNOSIS — G40909 Epilepsy, unspecified, not intractable, without status epilepticus: Secondary | ICD-10-CM | POA: Insufficient documentation

## 2022-06-29 DIAGNOSIS — N1831 Chronic kidney disease, stage 3a: Secondary | ICD-10-CM | POA: Diagnosis not present

## 2022-06-29 NOTE — Progress Notes (Addendum)
   Established Patient Office Visit  Subjective   Patient ID: Jamie Fields, female    DOB: 1956-07-15  Age: 66 y.o. MRN: [id number]  Chief Complaint  Patient presents with   Follow-up   Hypertension   66 years old female with history of chronic back pain, COPD, obstructive sleep apnea, CKD 3, seizure disorder is here for follow-up.  She says that she lives in [place] and wanted to establish care with doctor that is closer to her.  She did take hydrocodone 3 times a day for pain control.  She says that if she does not take pain medication then the pain is 10 out of 10 but if she takes pain medication pain is 5 out of 10 in intensity.  It is sharp and burning sometimes in nature.  Pain radiates to right leg.  Pain get worse with standing and walking and gets better with pain medication and rest.     Review of Systems  Constitutional: Negative.   HENT: Negative.    Respiratory: Negative.    Cardiovascular: Negative.   Gastrointestinal:  Positive for constipation.  Neurological:  Positive for tingling.      Objective:     BP 126/78 (BP Location: Right Arm, Patient Position: Sitting, Cuff Size: Normal)   Pulse 76   Temp (!) 97.4 F (36.3 C)   Resp 18   Ht '5\' 6"'$  (1.676 m)   Wt 238 lb 4 oz (108.1 kg)   SpO2 90%   BMI 38.45 kg/m    Physical Exam Constitutional:      Appearance: Normal appearance. She is obese.  HENT:     Head: Normocephalic and atraumatic.  Eyes:     Extraocular Movements: Extraocular movements intact.     Pupils: Pupils are equal, round, and reactive to light.  Cardiovascular:     Rate and Rhythm: Normal rate and regular rhythm.     Heart sounds: Normal heart sounds.  Pulmonary:     Effort: Pulmonary effort is normal.     Breath sounds: Normal breath sounds.  Abdominal:     General: Bowel sounds are normal.     Palpations: Abdomen is soft.  Musculoskeletal:     Cervical back: Neck supple.  Neurological:     General: No focal deficit  present.     Mental Status: She is alert and oriented to person, place, and time.  Psychiatric:        Mood and Affect: Mood normal.      No results found for any visits on 06/29/22.   The ASCVD Risk score (Arnett DK, et al., 2019) failed to calculate for the following reasons:   Cannot find a previous HDL lab   Cannot find a previous total cholesterol lab    Assessment & Plan:   Problem List Items Addressed This Visit       Endocrine   Hyperparathyroidism ([place])     Nervous and Auditory   Chronic bilateral low back pain with right-sided sciatica - Primary     Genitourinary   Stage 3a chronic kidney disease ([place])     Other   Morbid obesity ([place])    Return in about 3 months (around 09/28/2022).    Garwin Brothers, MD

## 2022-06-29 NOTE — Addendum Note (Signed)
Addended byLucile Shutters on: 06/29/2022 12:01 PM   Modules accepted: Orders

## 2022-06-30 LAB — CMP14 + ANION GAP
ALT: 21 IU/L (ref 0–32)
AST: 20 IU/L (ref 0–40)
Albumin/Globulin Ratio: 2.1 (ref 1.2–2.2)
Albumin: 4.5 g/dL (ref 3.9–4.9)
Alkaline Phosphatase: 113 IU/L (ref 44–121)
Anion Gap: 17 mmol/L (ref 10.0–18.0)
BUN/Creatinine Ratio: 16 (ref 12–28)
BUN: 20 mg/dL (ref 8–27)
Bilirubin Total: 0.4 mg/dL (ref 0.0–1.2)
CO2: 30 mmol/L — ABNORMAL HIGH (ref 20–29)
Calcium: 9.8 mg/dL (ref 8.7–10.3)
Chloride: 95 mmol/L — ABNORMAL LOW (ref 96–106)
Creatinine, Ser: 1.26 mg/dL — ABNORMAL HIGH (ref 0.57–1.00)
Globulin, Total: 2.1 g/dL (ref 1.5–4.5)
Glucose: 100 mg/dL — ABNORMAL HIGH (ref 70–99)
Potassium: 4.1 mmol/L (ref 3.5–5.2)
Sodium: 142 mmol/L (ref 134–144)
Total Protein: 6.6 g/dL (ref 6.0–8.5)
eGFR: 47 mL/min/{1.73_m2} — ABNORMAL LOW (ref >59)

## 2022-07-03 ENCOUNTER — Other Ambulatory Visit: Payer: Self-pay

## 2022-07-03 ENCOUNTER — Other Ambulatory Visit: Payer: Self-pay | Admitting: Internal Medicine

## 2022-07-03 MED ORDER — PREGABALIN 200 MG PO CAPS: 200.0000 mg | ORAL_CAPSULE | Freq: Three times a day (TID) | ORAL | 1 refills | Status: DC

## 2022-07-15 ENCOUNTER — Other Ambulatory Visit: Payer: Self-pay | Admitting: Internal Medicine

## 2022-07-15 MED ORDER — HYDROCODONE-ACETAMINOPHEN 5-325 MG PO TABS: 1.0000 | ORAL_TABLET | Freq: Three times a day (TID) | ORAL | 0 refills | Status: AC

## 2022-09-09 ENCOUNTER — Other Ambulatory Visit: Payer: Self-pay | Admitting: Internal Medicine

## 2022-09-09 ENCOUNTER — Other Ambulatory Visit: Payer: Self-pay

## 2022-09-09 MED ORDER — DAPAGLIFLOZIN PROPANEDIOL 10 MG PO TABS: 10.0000 mg | ORAL_TABLET | Freq: Every day | ORAL | 3 refills | Status: DC

## 2022-09-23 ENCOUNTER — Other Ambulatory Visit: Payer: Self-pay | Admitting: Internal Medicine

## 2022-09-23 MED ORDER — PREGABALIN 200 MG PO CAPS: 200.0000 mg | ORAL_CAPSULE | Freq: Three times a day (TID) | ORAL | 2 refills | Status: AC

## 2022-09-28 ENCOUNTER — Ambulatory Visit: Payer: 59 | Admitting: Internal Medicine

## 2023-01-25 ENCOUNTER — Other Ambulatory Visit: Payer: Self-pay | Admitting: Internal Medicine

## 2023-01-26 ENCOUNTER — Other Ambulatory Visit: Payer: Self-pay | Admitting: Internal Medicine

## 2024-01-10 ENCOUNTER — Other Ambulatory Visit: Payer: Self-pay | Admitting: Internal Medicine
